# Patient Record
Sex: Female | Born: 1950
Health system: Southern US, Community
[De-identification: ages and names within clinical notes are randomized; demographics above are authoritative.]

## PROBLEM LIST (undated history)

## (undated) DIAGNOSIS — C50919 Malignant neoplasm of unspecified site of unspecified female breast: Secondary | ICD-10-CM

## (undated) DIAGNOSIS — K259 Gastric ulcer, unspecified as acute or chronic, without hemorrhage or perforation: Secondary | ICD-10-CM

## (undated) DIAGNOSIS — I10 Essential (primary) hypertension: Secondary | ICD-10-CM

## (undated) DIAGNOSIS — E785 Hyperlipidemia, unspecified: Secondary | ICD-10-CM

## (undated) DIAGNOSIS — M1990 Unspecified osteoarthritis, unspecified site: Secondary | ICD-10-CM

## (undated) HISTORY — PX: BREAST SURGERY: SHX581

## (undated) HISTORY — DX: Malignant neoplasm of unspecified site of unspecified female breast: C50.919

## (undated) HISTORY — DX: Hyperlipidemia, unspecified: E78.5

## (undated) HISTORY — DX: Essential (primary) hypertension: I10

## (undated) HISTORY — PX: MASTECTOMY: SHX3

## (undated) HISTORY — DX: Gastric ulcer, unspecified as acute or chronic, without hemorrhage or perforation: K25.9

---

## 2000-02-10 ENCOUNTER — Ambulatory Visit (HOSPITAL_COMMUNITY): Admission: RE | Admit: 2000-02-10 | Discharge: 2000-02-10 | Payer: Self-pay | Admitting: *Deleted

## 2000-02-10 ENCOUNTER — Encounter: Payer: Self-pay | Admitting: Rheumatology

## 2004-06-19 ENCOUNTER — Emergency Department (HOSPITAL_COMMUNITY): Admission: EM | Admit: 2004-06-19 | Discharge: 2004-06-19 | Payer: Self-pay | Admitting: Emergency Medicine

## 2004-06-20 ENCOUNTER — Ambulatory Visit (HOSPITAL_COMMUNITY): Admission: RE | Admit: 2004-06-20 | Discharge: 2004-06-20 | Payer: Self-pay | Admitting: Surgery

## 2004-06-25 ENCOUNTER — Ambulatory Visit (HOSPITAL_COMMUNITY): Admission: RE | Admit: 2004-06-25 | Discharge: 2004-06-25 | Payer: Self-pay | Admitting: General Surgery

## 2004-08-03 DIAGNOSIS — I10 Essential (primary) hypertension: Secondary | ICD-10-CM

## 2004-08-03 HISTORY — DX: Essential (primary) hypertension: I10

## 2006-10-30 ENCOUNTER — Emergency Department (HOSPITAL_COMMUNITY): Admission: EM | Admit: 2006-10-30 | Discharge: 2006-10-30 | Payer: Self-pay | Admitting: Emergency Medicine

## 2007-01-01 ENCOUNTER — Emergency Department (HOSPITAL_COMMUNITY): Admission: EM | Admit: 2007-01-01 | Discharge: 2007-01-02 | Payer: Self-pay | Admitting: Family Medicine

## 2008-11-27 ENCOUNTER — Encounter: Payer: Self-pay | Admitting: Gynecology

## 2008-11-27 ENCOUNTER — Ambulatory Visit: Payer: Self-pay | Admitting: Gynecology

## 2008-11-27 ENCOUNTER — Other Ambulatory Visit: Admission: RE | Admit: 2008-11-27 | Discharge: 2008-11-27 | Payer: Self-pay | Admitting: Gynecology

## 2008-12-10 ENCOUNTER — Ambulatory Visit: Payer: Self-pay | Admitting: Gynecology

## 2009-01-01 DIAGNOSIS — C50919 Malignant neoplasm of unspecified site of unspecified female breast: Secondary | ICD-10-CM

## 2009-01-01 HISTORY — DX: Malignant neoplasm of unspecified site of unspecified female breast: C50.919

## 2009-01-23 ENCOUNTER — Encounter: Admission: RE | Admit: 2009-01-23 | Discharge: 2009-01-23 | Payer: Self-pay | Admitting: Surgery

## 2009-02-01 ENCOUNTER — Encounter: Admission: RE | Admit: 2009-02-01 | Discharge: 2009-02-01 | Payer: Self-pay | Admitting: Surgery

## 2009-03-06 ENCOUNTER — Encounter: Admission: RE | Admit: 2009-03-06 | Discharge: 2009-03-06 | Payer: Self-pay | Admitting: Surgery

## 2009-03-06 ENCOUNTER — Encounter (INDEPENDENT_AMBULATORY_CARE_PROVIDER_SITE_OTHER): Payer: Self-pay | Admitting: Diagnostic Radiology

## 2009-03-12 ENCOUNTER — Ambulatory Visit: Payer: Self-pay | Admitting: Gynecology

## 2009-03-29 ENCOUNTER — Ambulatory Visit: Payer: Self-pay | Admitting: Oncology

## 2009-04-03 HISTORY — PX: MASTECTOMY: SHX3

## 2009-04-03 LAB — COMPREHENSIVE METABOLIC PANEL
Albumin: 4.1 g/dL (ref 3.5–5.2)
Alkaline Phosphatase: 68 U/L (ref 39–117)
BUN: 12 mg/dL (ref 6–23)
CO2: 26 mEq/L (ref 19–32)
Calcium: 9.7 mg/dL (ref 8.4–10.5)
Glucose, Bld: 97 mg/dL (ref 70–99)
Potassium: 4.3 mEq/L (ref 3.5–5.3)

## 2009-04-03 LAB — CBC WITH DIFFERENTIAL/PLATELET
Basophils Absolute: 0.1 10*3/uL (ref 0.0–0.1)
Eosinophils Absolute: 0.2 10*3/uL (ref 0.0–0.5)
HGB: 14.5 g/dL (ref 11.6–15.9)
MCV: 84.6 fL (ref 79.5–101.0)
MONO%: 5.6 % (ref 0.0–14.0)
NEUT#: 4.4 10*3/uL (ref 1.5–6.5)
Platelets: 360 10*3/uL (ref 145–400)
RDW: 13.3 % (ref 11.2–14.5)

## 2009-04-09 ENCOUNTER — Encounter: Admission: RE | Admit: 2009-04-09 | Discharge: 2009-04-09 | Payer: Self-pay | Admitting: Oncology

## 2009-05-01 ENCOUNTER — Encounter: Payer: Self-pay | Admitting: Internal Medicine

## 2009-05-01 ENCOUNTER — Encounter: Admission: RE | Admit: 2009-05-01 | Discharge: 2009-05-01 | Payer: Self-pay | Admitting: Surgery

## 2009-05-02 ENCOUNTER — Ambulatory Visit (HOSPITAL_BASED_OUTPATIENT_CLINIC_OR_DEPARTMENT_OTHER): Admission: RE | Admit: 2009-05-02 | Discharge: 2009-05-02 | Payer: Self-pay | Admitting: Surgery

## 2009-05-11 ENCOUNTER — Emergency Department (HOSPITAL_COMMUNITY): Admission: EM | Admit: 2009-05-11 | Discharge: 2009-05-11 | Payer: Self-pay | Admitting: Emergency Medicine

## 2009-05-21 ENCOUNTER — Ambulatory Visit: Payer: Self-pay | Admitting: Oncology

## 2009-05-23 LAB — LIPID PANEL: Cholesterol: 197 mg/dL (ref 0–200)

## 2009-08-04 ENCOUNTER — Emergency Department (HOSPITAL_COMMUNITY): Admission: EM | Admit: 2009-08-04 | Discharge: 2009-08-05 | Payer: Self-pay | Admitting: Emergency Medicine

## 2009-08-04 ENCOUNTER — Encounter: Payer: Self-pay | Admitting: Internal Medicine

## 2009-08-12 ENCOUNTER — Ambulatory Visit: Payer: Self-pay | Admitting: Internal Medicine

## 2009-08-12 DIAGNOSIS — I10 Essential (primary) hypertension: Secondary | ICD-10-CM | POA: Insufficient documentation

## 2009-08-12 DIAGNOSIS — Z853 Personal history of malignant neoplasm of breast: Secondary | ICD-10-CM | POA: Insufficient documentation

## 2009-08-15 ENCOUNTER — Telehealth (INDEPENDENT_AMBULATORY_CARE_PROVIDER_SITE_OTHER): Payer: Self-pay | Admitting: *Deleted

## 2009-08-23 ENCOUNTER — Encounter (INDEPENDENT_AMBULATORY_CARE_PROVIDER_SITE_OTHER): Payer: Self-pay | Admitting: *Deleted

## 2009-08-23 ENCOUNTER — Ambulatory Visit: Payer: Self-pay | Admitting: Internal Medicine

## 2009-08-27 ENCOUNTER — Telehealth (INDEPENDENT_AMBULATORY_CARE_PROVIDER_SITE_OTHER): Payer: Self-pay | Admitting: *Deleted

## 2009-09-04 ENCOUNTER — Telehealth (INDEPENDENT_AMBULATORY_CARE_PROVIDER_SITE_OTHER): Payer: Self-pay

## 2009-09-05 ENCOUNTER — Telehealth: Payer: Self-pay | Admitting: Internal Medicine

## 2009-09-06 ENCOUNTER — Ambulatory Visit: Payer: Self-pay | Admitting: Internal Medicine

## 2009-09-17 ENCOUNTER — Ambulatory Visit: Payer: Self-pay

## 2009-09-17 ENCOUNTER — Ambulatory Visit: Payer: Self-pay | Admitting: Cardiology

## 2009-09-17 ENCOUNTER — Encounter (HOSPITAL_COMMUNITY): Admission: RE | Admit: 2009-09-17 | Discharge: 2009-11-06 | Payer: Self-pay | Admitting: Internal Medicine

## 2009-09-17 ENCOUNTER — Ambulatory Visit: Payer: Self-pay | Admitting: Oncology

## 2009-09-19 ENCOUNTER — Encounter: Payer: Self-pay | Admitting: Internal Medicine

## 2009-09-19 LAB — CBC WITH DIFFERENTIAL/PLATELET
BASO%: 0.8 % (ref 0.0–2.0)
Basophils Absolute: 0.1 10*3/uL (ref 0.0–0.1)
EOS%: 2.8 % (ref 0.0–7.0)
Eosinophils Absolute: 0.2 10*3/uL (ref 0.0–0.5)
HCT: 43.2 % (ref 34.8–46.6)
HGB: 14.6 g/dL (ref 11.6–15.9)
LYMPH%: 41 % (ref 14.0–49.7)
MCH: 27.9 pg (ref 25.1–34.0)
MCHC: 33.8 g/dL (ref 31.5–36.0)
MCV: 82.5 fL (ref 79.5–101.0)
MONO#: 0.5 10*3/uL (ref 0.1–0.9)
MONO%: 7.7 % (ref 0.0–14.0)
NEUT#: 3.4 10*3/uL (ref 1.5–6.5)
NEUT%: 47.7 % (ref 38.4–76.8)
Platelets: 363 10*3/uL (ref 145–400)
RBC: 5.24 10*6/uL (ref 3.70–5.45)
RDW: 14.7 % — ABNORMAL HIGH (ref 11.2–14.5)
WBC: 7.1 10*3/uL (ref 3.9–10.3)
lymph#: 2.9 10*3/uL (ref 0.9–3.3)

## 2009-09-19 LAB — CONVERTED CEMR LAB
ALT: 24 units/L
AST: 17 units/L
Albumin: 3.8 g/dL
BUN: 11 mg/dL
CO2, serum: 32 mmol/L
Calcium: 9.7 mg/dL
Chloride, Serum: 102 mmol/L
Creatinine, Ser: 0.47 mg/dL
Glucose, Bld: 118 mg/dL
HCT: 43.2 %
Hemoglobin: 14.6 g/dL
Potassium, serum: 3.9 mmol/L
RBC count: 5.24 10*6/uL
Sodium, serum: 139 mmol/L
Total Bilirubin: 0.7 mg/dL
Total Protein: 7.5 g/dL
WBC, blood: 7.1 10*3/uL
platelet count: 363 10*3/uL

## 2009-09-19 LAB — COMPREHENSIVE METABOLIC PANEL
ALT: 24 U/L (ref 0–35)
AST: 17 U/L (ref 0–37)
Albumin: 3.8 g/dL (ref 3.5–5.2)
Alkaline Phosphatase: 76 U/L (ref 39–117)
BUN: 11 mg/dL (ref 6–23)
CO2: 32 mEq/L (ref 19–32)
Calcium: 9.7 mg/dL (ref 8.4–10.5)
Chloride: 102 mEq/L (ref 96–112)
Creatinine, Ser: 0.47 mg/dL (ref 0.40–1.20)
Glucose, Bld: 118 mg/dL — ABNORMAL HIGH (ref 70–99)
Potassium: 3.9 mEq/L (ref 3.5–5.3)
Sodium: 139 mEq/L (ref 135–145)
Total Bilirubin: 0.7 mg/dL (ref 0.3–1.2)
Total Protein: 7.5 g/dL (ref 6.0–8.3)

## 2009-09-19 LAB — VITAMIN D 25 HYDROXY (VIT D DEFICIENCY, FRACTURES): Vit D, 25-Hydroxy: 16 ng/mL — ABNORMAL LOW (ref 30–89)

## 2009-09-20 ENCOUNTER — Ambulatory Visit: Payer: Self-pay | Admitting: Internal Medicine

## 2009-10-01 ENCOUNTER — Ambulatory Visit: Payer: Self-pay | Admitting: Internal Medicine

## 2009-10-01 ENCOUNTER — Encounter (INDEPENDENT_AMBULATORY_CARE_PROVIDER_SITE_OTHER): Payer: Self-pay | Admitting: *Deleted

## 2009-10-01 LAB — CONVERTED CEMR LAB: Anti Nuclear Antibody(ANA): NEGATIVE

## 2009-10-03 LAB — CONVERTED CEMR LAB
Basophils Absolute: 0.1 10*3/uL (ref 0.0–0.1)
Basophils Relative: 0.9 % (ref 0.0–3.0)
Eosinophils Absolute: 0.2 10*3/uL (ref 0.0–0.7)
Eosinophils Relative: 2.7 % (ref 0.0–5.0)
HCT: 42.2 % (ref 36.0–46.0)
Hemoglobin: 14 g/dL (ref 12.0–15.0)
Lymphocytes Relative: 44.4 % (ref 12.0–46.0)
Lymphs Abs: 2.7 10*3/uL (ref 0.7–4.0)
MCHC: 33.1 g/dL (ref 30.0–36.0)
MCV: 84.7 fL (ref 78.0–100.0)
Monocytes Absolute: 0.5 10*3/uL (ref 0.1–1.0)
Monocytes Relative: 8 % (ref 3.0–12.0)
Neutro Abs: 2.8 10*3/uL (ref 1.4–7.7)
Neutrophils Relative %: 44 % (ref 43.0–77.0)
Platelets: 327 10*3/uL (ref 150.0–400.0)
RBC: 4.98 M/uL (ref 3.87–5.11)
RDW: 14.1 % (ref 11.5–14.6)
Rheumatoid fact SerPl-aCnc: <20 intl units/mL (ref 0.0–20.0)
Sed Rate: 24 mm/hr — ABNORMAL HIGH (ref 0–22)
WBC: 6.3 10*3/uL (ref 4.5–10.5)

## 2009-10-11 ENCOUNTER — Ambulatory Visit: Payer: Self-pay | Admitting: Internal Medicine

## 2009-10-21 ENCOUNTER — Telehealth (INDEPENDENT_AMBULATORY_CARE_PROVIDER_SITE_OTHER): Payer: Self-pay | Admitting: *Deleted

## 2009-10-27 ENCOUNTER — Telehealth (INDEPENDENT_AMBULATORY_CARE_PROVIDER_SITE_OTHER): Payer: Self-pay | Admitting: *Deleted

## 2009-10-28 ENCOUNTER — Encounter (INDEPENDENT_AMBULATORY_CARE_PROVIDER_SITE_OTHER): Payer: Self-pay | Admitting: *Deleted

## 2009-11-28 ENCOUNTER — Encounter: Payer: Self-pay | Admitting: Internal Medicine

## 2009-11-28 ENCOUNTER — Ambulatory Visit: Payer: Self-pay | Admitting: Gynecology

## 2009-11-28 ENCOUNTER — Other Ambulatory Visit: Admission: RE | Admit: 2009-11-28 | Discharge: 2009-11-28 | Payer: Self-pay | Admitting: Gynecology

## 2009-12-06 ENCOUNTER — Ambulatory Visit: Payer: Self-pay | Admitting: Oncology

## 2009-12-06 ENCOUNTER — Telehealth (INDEPENDENT_AMBULATORY_CARE_PROVIDER_SITE_OTHER): Payer: Self-pay | Admitting: *Deleted

## 2009-12-09 ENCOUNTER — Encounter: Payer: Self-pay | Admitting: Internal Medicine

## 2010-01-21 ENCOUNTER — Ambulatory Visit: Payer: Self-pay | Admitting: Internal Medicine

## 2010-01-21 ENCOUNTER — Encounter (INDEPENDENT_AMBULATORY_CARE_PROVIDER_SITE_OTHER): Payer: Self-pay | Admitting: *Deleted

## 2010-01-21 DIAGNOSIS — M1990 Unspecified osteoarthritis, unspecified site: Secondary | ICD-10-CM | POA: Insufficient documentation

## 2010-01-24 ENCOUNTER — Telehealth: Payer: Self-pay | Admitting: Internal Medicine

## 2010-01-27 ENCOUNTER — Encounter: Payer: Self-pay | Admitting: Internal Medicine

## 2010-02-07 ENCOUNTER — Ambulatory Visit: Payer: Self-pay | Admitting: Internal Medicine

## 2010-02-12 ENCOUNTER — Encounter (INDEPENDENT_AMBULATORY_CARE_PROVIDER_SITE_OTHER): Payer: Self-pay | Admitting: *Deleted

## 2010-02-12 LAB — CONVERTED CEMR LAB
ALT: 25 units/L (ref 0–35)
AST: 18 units/L (ref 0–37)
BUN: 12 mg/dL (ref 6–23)
CO2: 30 meq/L (ref 19–32)
Calcium: 9.7 mg/dL (ref 8.4–10.5)
Chloride: 104 meq/L (ref 96–112)
Cholesterol: 189 mg/dL (ref 0–200)
Creatinine, Ser: 0.4 mg/dL (ref 0.4–1.2)
GFR calc non Af Amer: 169.84 mL/min (ref >60)
Glucose, Bld: 92 mg/dL (ref 70–99)
HDL: 40 mg/dL (ref >39.00)
LDL Cholesterol: 126 mg/dL — ABNORMAL HIGH (ref 0–99)
Potassium: 4.2 meq/L (ref 3.5–5.1)
Sodium: 142 meq/L (ref 135–145)
TSH: 1.01 microintl units/mL (ref 0.35–5.50)
Total CHOL/HDL Ratio: 5
Triglycerides: 115 mg/dL (ref 0.0–149.0)
Uric Acid, Serum: 4.9 mg/dL (ref 2.4–7.0)
VLDL: 23 mg/dL (ref 0.0–40.0)

## 2010-03-18 ENCOUNTER — Ambulatory Visit: Payer: Self-pay | Admitting: Gastroenterology

## 2010-03-19 ENCOUNTER — Telehealth (INDEPENDENT_AMBULATORY_CARE_PROVIDER_SITE_OTHER): Payer: Self-pay | Admitting: *Deleted

## 2010-03-25 ENCOUNTER — Emergency Department (HOSPITAL_COMMUNITY): Admission: EM | Admit: 2010-03-25 | Discharge: 2010-03-26 | Payer: Self-pay | Admitting: Emergency Medicine

## 2010-05-08 ENCOUNTER — Telehealth (INDEPENDENT_AMBULATORY_CARE_PROVIDER_SITE_OTHER): Payer: Self-pay | Admitting: *Deleted

## 2010-05-13 ENCOUNTER — Ambulatory Visit: Payer: Self-pay | Admitting: Oncology

## 2010-05-15 ENCOUNTER — Encounter: Payer: Self-pay | Admitting: Internal Medicine

## 2010-08-11 ENCOUNTER — Ambulatory Visit: Admit: 2010-08-11 | Payer: Self-pay | Admitting: Internal Medicine

## 2010-08-12 ENCOUNTER — Other Ambulatory Visit: Payer: Self-pay | Admitting: Internal Medicine

## 2010-08-12 ENCOUNTER — Ambulatory Visit
Admission: RE | Admit: 2010-08-12 | Discharge: 2010-08-12 | Payer: Self-pay | Source: Home / Self Care | Attending: Internal Medicine | Admitting: Internal Medicine

## 2010-08-13 LAB — BASIC METABOLIC PANEL
BUN: 16 mg/dL (ref 6–23)
CO2: 29 mEq/L (ref 19–32)
Calcium: 10.2 mg/dL (ref 8.4–10.5)
Chloride: 101 mEq/L (ref 96–112)
Creatinine, Ser: 0.5 mg/dL (ref 0.4–1.2)
GFR: 131.78 mL/min (ref >60.00)
Glucose, Bld: 99 mg/dL (ref 70–99)
Potassium: 4.2 mEq/L (ref 3.5–5.1)
Sodium: 139 mEq/L (ref 135–145)

## 2010-08-25 ENCOUNTER — Encounter: Payer: Self-pay | Admitting: Surgery

## 2010-09-04 NOTE — Letter (Signed)
[facility]   Imported By: Lanelle Bal 10/11/2009 09:25:05  _____________________________________________________________________  External Attachment:    Type:   Image     Comment:   External Document

## 2010-09-04 NOTE — Assessment & Plan Note (Signed)
Summary: bp check/kdc  Nurse Visit   Vital Signs:  Patient profile:   60 year old female Height:      65 inches Weight:      200 pounds Pulse rate:   82 / minute BP sitting:   150 / 80  Vitals Entered By: Shary Decamp (September 06, 2009 4:12 PM) CC: BP CHECK Comments BP READINGS @ HOME: 150/76, 143/76, 162/80, 137/76, 128/91, 147/86, 162/81, 148/82 Shary Decamp  September 06, 2009 4:12 PM     Allergies: No Known Drug Allergies  Orders Added: 1)  No Charge Patient Arrived (NCPA0) [NCPA0]  Impression & Recommendations:  Problem # 1:  HYPERTENSION (ICD-401.9)  see instructions  Her updated medication list for this problem includes:    Metoprolol Tartrate 50 Mg Tabs (Metoprolol tartrate) .Marland Kitchen... 1 by mouth once daily    Hydrochlorothiazide 25 Mg Tabs (Hydrochlorothiazide) ..... One tablet, once a day in the morning    Lisinopril 40 Mg Tabs (Lisinopril) .Marland Kitchen... 1 by mouth once daily - needs bp check in 2 weeks  BP today: 150/80 Prior BP: 124/80 (08/23/2009)  Orders: No Charge Patient Arrived (NCPA0) (NCPA0)  Complete Medication List: 1)  Metoprolol Tartrate 50 Mg Tabs (Metoprolol tartrate) .Marland Kitchen.. 1 by mouth once daily 2)  Hydrochlorothiazide 25 Mg Tabs (Hydrochlorothiazide) .... One tablet, once a day in the morning 3)  Anastrozole 1 Mg Tabs (Anastrozole) .... Once daily 4)  Nabumetone 750 Mg Tabs (Nabumetone) .... As needed 5)  Fish Oil  6)  Aspirin 81 Mg Tbec (Aspirin) .Marland Kitchen.. 1 a day 7)  Lisinopril 40 Mg Tabs (Lisinopril) .Marland Kitchen.. 1 by mouth once daily - needs bp check in 2 weeks  Prescriptions: LISINOPRIL 40 MG TABS (LISINOPRIL) 1 by mouth once daily - needs bp check in 2 weeks  #30 x 1   Entered by:   Shary Decamp   Authorized by:   Nolon Rod. Jaimie Pippins MD   Signed by:   Shary Decamp on 09/06/2009   Method used:   Electronically to        [facility] (retail)       [address]       [place], [place]  [address]       Ph: [phone]       Fax:  [phone]   RxID:   [id number] LISINOPRIL 40 MG TABS (LISINOPRIL) 1 by mouth once daily - needs bp check in 2 weeks  #30 x 1   Entered by:   Shary Decamp   Authorized by:   Nolon Rod. Ricci Dirocco MD   Signed by:   Shary Decamp on 09/06/2009   Method used:   Electronically to        [facility] (retail)       [phone]       [place], [place]  [address]       Ph: [phone]       Fax: [phone]   RxID:   [id number]    Patient Instructions: 1)  increase lisinopril to 40mg  2)  nurse visit to check bp & bmp in 2 weeks

## 2010-09-04 NOTE — Progress Notes (Signed)
Summary: hold anastrazole, RTC 3 months   Phone Note Outgoing Call   Summary of Call: advise patient:   Dr Paulla Dolly rec to hold anastrazole , they will check on her 6 weeks ----> if she doesn't hear from them let me know  needs OV here in 3 months  Jose E. Paz MD  October 27, 2009 10:00 AM   Follow-up for Phone Call        mailbox is full unable to leave msg, letter mailed .Kandice Hams  October 28, 2009 11:12 AM  Follow-up by: Kandice Hams,  October 28, 2009 11:12 AM

## 2010-09-04 NOTE — Assessment & Plan Note (Signed)
Summary: bmp,bp check/kdc  Nurse Visit   Vital Signs:  Patient profile:   60 year old female Height:      65 inches Weight:      200 pounds Pulse rate:   80 / minute BP sitting:   124 / 80  Vitals Entered By: Shary Decamp (August 23, 2009 3:50 PM) CC: bp check Comments  - need to change losartan $$$ Shary Decamp  August 23, 2009 3:51 PM    Impression & Recommendations:  Problem # 1:  HYPERTENSION (ICD-401.9) change losartan to ACE i d/t  $$ The following medications were removed from the medication list:    Losartan Potassium 50 Mg Tabs (Losartan potassium) ..... One by mouth twice a day Her updated medication list for this problem includes:    Metoprolol Tartrate 50 Mg Tabs (Metoprolol tartrate) .Marland Kitchen... 1 by mouth once daily    Hydrochlorothiazide 25 Mg Tabs (Hydrochlorothiazide) ..... One tablet, once a day in the morning    Lisinopril 20 Mg Tabs (Lisinopril) .Marland Kitchen... 1 by mouth once daily - needs blood pressure checked in 2 weeks  Orders: Est. Patient Level I ([address]) Venipuncture ([address])  BP today: 124/80 Prior BP: 124/80 (08/12/2009)  Complete Medication List: 1)  Metoprolol Tartrate 50 Mg Tabs (Metoprolol tartrate) .Marland Kitchen.. 1 by mouth once daily 2)  Hydrochlorothiazide 25 Mg Tabs (Hydrochlorothiazide) .... One tablet, once a day in the morning 3)  Anastrozole 1 Mg Tabs (Anastrozole) .... Once daily 4)  Nabumetone 750 Mg Tabs (Nabumetone) .... As needed 5)  Fish Oil  6)  Aspirin 81 Mg Tbec (Aspirin) .Marland Kitchen.. 1 a day 7)  Lisinopril 20 Mg Tabs (Lisinopril) .Marland Kitchen.. 1 by mouth once daily - needs blood pressure checked in 2 weeks   Allergies: No Known Drug Allergies  Orders Added: 1)  Est. Patient Level I [[address]] 2)  Venipuncture [[address]] Prescriptions: LISINOPRIL 20 MG TABS (LISINOPRIL) 1 by mouth once daily - NEEDS BLOOD PRESSURE CHECKED IN 2 WEEKS  #30 x 1   Entered by:   Shary Decamp   Authorized by:   Nolon Rod. Declin Rajan MD   Signed by:   Shary Decamp on 08/23/2009   Method  used:   Electronically to        [facility] [address] [facility] (retail)       [phone]       [place], [place]  [address]       Ph: [phone]       Fax: [phone]   RxID:   [id number]    Patient Instructions: 1)  STOP Losartan potassium 2)  START Lisinopril 20mg  1 tablet daily 3)  Continue with Hydrochlorothiazide 25mg  1 tablet daily 4)  Nurse visit to check blood pressure in 2 weeks

## 2010-09-04 NOTE — Progress Notes (Signed)
  Phone Note Other Incoming   Caller: Patsy, Publishing rights manager Med Summary of Call: Patient cancelled stress test on 2/1 & 2/3.  (had to work).  She rescheduled for 2/15 Initial call taken by: Shary Decamp,  September 05, 2009 10:30 AM

## 2010-09-04 NOTE — Letter (Signed)
Summary: [facility]   Imported By: Lanelle Bal 12/06/2009 10:39:10  _____________________________________________________________________  External Attachment:    Type:   Image     Comment:   External Document

## 2010-09-04 NOTE — Progress Notes (Signed)
Summary: Resend Rx. to different pharmacy   Phone Note Refill Request Message from:  Patient on August 27, 2009 3:46 PM  Refills Requested: Medication #1:  METOPROLOL TARTRATE 50 MG TABS 1 by mouth once daily Daughter called and states that Rx. was sent to wrong [facility] and it should be resent to [address] [facility] [place]. Call daughterConcepcion Living at [phone] when this has been resent .  Initial call taken by: Michaelle Copas,  August 27, 2009 3:47 PM    Prescriptions: METOPROLOL TARTRATE 50 MG TABS (METOPROLOL TARTRATE) 1 by mouth once daily  #30 x 1   Entered by:   Shary Decamp   Authorized by:   Nolon Rod. Paz MD   Signed by:   Shary Decamp on 08/27/2009   Method used:   Electronically to        [facility] (retail)       [address]       [place], [place]  [address]       Ph: [phone]       Fax: [phone]   RxID:   [id number]

## 2010-09-04 NOTE — Assessment & Plan Note (Signed)
Summary: NORMAL Cardiology Nuclear Study  Nuclear Med Background Indications for Stress Test: Evaluation for Ischemia  Indications Comments: ED 08/04/09: CP secondary to HTN   History Comments: NO DOCUMENTED CAD  Symptoms: Chest Pressure, Chest Tightness, Diaphoresis, Dizziness, DOE, Fatigue, Nausea, Rapid HR, Vomiting  Symptoms Comments: CP>neck. Last episode of CP:3 days ago.   Nuclear Pre-Procedure Cardiac Risk Factors: Family History - CAD, History of Smoking, Hypertension, Obesity Caffeine/Decaff Intake: None NPO After: 9:00 AM Lungs: Clear.  O2 Sat 96% on RA. IV 0.9% NS with Angio Cath: 20g     IV Site: (R) wrist IV Started by: Stanton Kidney EMT-P Chest Size (in) 40     Cup Size C     Height (in): 65 Weight (lb): 197 BMI: 32.90 Tech Comments: Metoprolol was taken @ 8 am this day, per patient; resting heart rate 86.  Leotis Shames, EMT-P  Nuclear Med Study 1 or 2 day study:  1 day     Stress Test Type:  Eugenie Birks Reading MD:  Willa Rough, MD     Referring MD:  Willow Ora, MD Resting Radionuclide:  Technetium 26m Tetrofosmin     Resting Radionuclide Dose:  10.9 mCi  Stress Radionuclide:  Technetium 42m Tetrofosmin     Stress Radionuclide Dose:  33.0 mCi   Stress Protocol   Lexiscan: 0.4 mg   Stress Test Technologist:  Rea [employer] CMA-N     Nuclear Technologist:  Burna Mortimer Deal RT-N  Rest Procedure  Myocardial perfusion imaging was performed at rest 45 minutes following the intravenous administration of Myoview Technetium 40m Tetrofosmin.  Stress Procedure  The patient initially walked on the treadmill utilizing the Bruce protocol, but was unable to get her heart rate up due to "leg weakness", after 5:22.  She did c/o chest tightness, 5/10, with exercise.  She was then given IV Lexiscan 0.4 mg over 15-seconds.  Myoview injected at 30-seconds.  There were no significant changes with lexiscan.  Quantitative spect images were obtained after a 45 minute delay.  QPS Raw Data Images:   Normal; no motion artifact; normal heart/lung ratio. Stress Images:  There is normal uptake in all areas. Rest Images:  Normal homogeneous uptake in all areas of the myocardium. Subtraction (SDS):  No evidence of ischemia. Transient Ischemic Dilatation:  .89  (Normal <1.22)  Lung/Heart Ratio:  .32  (Normal <0.45)  Quantitative Gated Spect Images QGS EDV:  76 ml QGS ESV:  24 ml QGS EF:  68 % QGS cine images:  Normal motion  Findings Normal nuclear study      Overall Impression  Exercise Capacity: Lexiscan BP Response: Normal blood pressure response. Clinical Symptoms: See below ECG Impression: No significant ST segment change suggestive of ischemia. Overall Impression: Normal stress nuclear study. Overall Impression Comments: Patient took her Metoprolol today. She walked on the treadmill, but could not get adequate HR. She did have chest pain with no EKG change. Study was changed to [facility]

## 2010-09-04 NOTE — Assessment & Plan Note (Signed)
Summary: 2 mth fu/ns/kdc   Vital Signs:  Patient profile:   60 year old female Weight:      199 pounds Pulse rate:   80 / minute BP sitting:   110 / 68  (right arm)  Vitals Entered By: Doristine Devoid (October 11, 2009 3:40 PM) CC: 2 month roa    History of Present Illness: here for a follow-up regards the last of this visit, since then, her blood work came back and is fine.  Sed rate is 24  Allergies: No Known Drug Allergies  Past History:  Past Medical History: Reviewed history from 10/01/2009 and no changes required. Breast cancer, Dx June 2010--status post mastectomy, Arimidex for 5 years Hypertension-- Dx 2006  Past Surgical History: Reviewed history from 08/12/2009 and no changes required. Mastectomy - left 04-2009  Social History: Reviewed history from 10/01/2009 and no changes required. Occupation: [employer], Pharmacologist Married children x 4  from [place] Tobacco-- quit 2005,< <1 ppd ETOH-- no   Review of Systems       continue with multiple achy joints, again states that the shoulders are the most painful joints follow it by the ankles  Physical Exam  General:  alert and well-developed.   Extremities:  Shoulders: Symmetric, tender to palpation at the [facility] joint, range of motion slightly decreased   Impression & Recommendations:  Problem # 1:  ARTHRALGIA (ICD-719.40) workup negative, on further questioning, symptoms were apparently worse since she started taking anastrozole according to UTD, this medication may cause arthralgias in up to 17% of patients. Plan: Will contact oncology and discuss this issue consider referral to rheumatology  further workup? local injections?  Problem # 2:  addendum d/w Dr Darnelle Catalan: rec to hold anastrazole , they will check on her 6 weeks I'll check on her in 3 months , she should be back to baseline then will notify patient   Complete Medication List: 1)  Lisinopril 40 Mg Tabs (Lisinopril) .Marland Kitchen.. 1 by mouth  once daily - needs bp check in 2 weeks 2)  Metoprolol Tartrate 50 Mg Tabs (Metoprolol tartrate) .Marland Kitchen.. 1 by mouth once daily 3)  Hydrochlorothiazide 25 Mg Tabs (Hydrochlorothiazide) .... One tablet, once a day in the morning 4)  Anastrozole 1 Mg Tabs (Anastrozole) .... Once daily 5)  Fish Oil  6)  Aspirin 81 Mg Tbec (Aspirin) .Marland Kitchen.. 1 a day 7)  Meloxicam 7.5 Mg Tabs (Meloxicam) .... One by mouth daily as needed for pain

## 2010-09-04 NOTE — Letter (Signed)
Summary: Primary Care Consult Scheduled Letter  [facility] [place]  [address] [place], [place] [address]   Phone: [phone]  Fax: [phone]      08/23/2009 MRN: [id number]  Eve Ortman [address] [place], [place]  [address]    Dear Ms. Junie Spencer,    We have scheduled an appointment for you.  At the recommendation of Dr. Willow Ora, we have scheduled you for a Cardio Stress Test with Selena Batten on 09-03-2009 at 9:00am.  Their address is [address], [address] floor, [place] [address]. The office phone number is [phone].  If this appointment day and time is not convenient for you, please feel free to call the office of the doctor you are being referred to at the number listed above and reschedule the appointment.    It is important for you to keep your scheduled appointments. We are here to make sure you are given good patient care.   Thank you,    Renee, Patient Care Coordinator [facility] [place]    **WE ARE UNABLE TO REACH YOU BY PHONE.  YOUR PHONE NUMBER OF [phone] SAYS THE "MAILBOX IF FULL UNABLE TO LEAVE A MESSAGE".  PLEASE CONTACT [facility] FOR INSTRUCTIONS ON HOW TO PREPARE FOR YOUR ABOVE APPOINTMENT.  ALSO CONTACT OUR OFFICE TO CONFIRM THIS APPOINTMENT**

## 2010-09-04 NOTE — Progress Notes (Signed)
Summary: REFILL REQUEST--  Phone Note Refill Request Call back at [phone] Message from:  Pharmacy on January 24, 2010 10:17 AM  Refills Requested: Medication #1:  CELEBREX 200 MG CAPS one by mouth daily as needed for pain.   Dosage confirmed as above?Dosage Confirmed   Supply Requested: 1 month  Medication #2:  RELAFEN 750 MG TAB Take tablet by mouth twice daily as needed for pain and inflammation   Dosage confirmed as above?Dosage Confirmed   Supply Requested: 1 month   Last Refilled: 07/23/2009 [facility]- CELEBREX PRIOR AUTH-[id number]  RELAFEN- [facility] [address]  Next Appointment Scheduled: JULY 8TH 2011 Initial call taken by: Lavell Islam,  January 24, 2010 10:18 AM  Follow-up for Phone Call        Called ins. and spoke to [place] to initiate Prior Auth for Celebrex.  Form will be faxed for completion. Mervin Kung CMA  January 24, 2010 1:20 PM   Form received and placed on ledge for Provider completion.  Mervin Kung CMA  January 24, 2010 5:14 PM   Additional Follow-up for Phone Call Additional follow up Details #1::        done [facility] E. Nashid Pellum MD  January 27, 2010 8:15 AM  faxed back..............Marland KitchenFelecia Deloach CMA  January 27, 2010 5:39 PM     Additional Follow-up for Phone Call Additional follow up Details #2::    spoke with pharmacy celebrex too expensive for pt. pt would like to go back to RELAFEN. pls advise.............Marland KitchenFelecia Deloach CMA  January 30, 2010 10:08 AM   ok to go back to previous dose, 1 month, 3 RF Teryl Mcconaghy E. Vernelle Wisner MD  January 30, 2010 2:18 PM   New/Updated Medications: NABUMETONE 750 MG TABS (NABUMETONE) as needed for pain Prescriptions: NABUMETONE 750 MG TABS (NABUMETONE) as needed for pain  #1 month x 3   Entered by:   Jeremy Johann CMA   Authorized by:   Nolon Rod. Inocencio Roy MD   Signed by:   Jeremy Johann CMA on 01/30/2010   Method used:   F[facility]ed to ...       [facility]       [address]       [place], [place]  [address]       Ph: [phone]       Fax: [phone]   RxID:   [id number]   Appended Document: REFILL REQUEST-- Prescriptions: NABUMETONE 750 MG TABS (NABUMETONE) Take 1 tablet by mouth twice daily as needed for pain and inflammation  #1 month x 3   Entered by:   Jeremy Johann CMA   Authorized by:   Nolon Rod. Dorcus Riga MD   Signed by:   Jeremy Johann CMA on 01/30/2010   Method used:   Re-F[facility]ed to ...       [facility]       [address]       [place], [place]  [address]       Ph: [phone]       Fax: [phone]   RxID:   [id number]  called pharmacy cancel 1st rx.Felecia Deloach CMA  January 30, 2010 5:21 PM

## 2010-09-04 NOTE — Assessment & Plan Note (Signed)
Summary: new, referred by dr Darnelle Catalan for htn/swh only speak spanish   Vital Signs:  Patient profile:   60 year old female Height:      65 inches Weight:      200 pounds BMI:     33.40 Pulse rate:   80 / minute BP sitting:   124 / 80  Vitals Entered By: Shary Decamp (August 12, 2009 3:47 PM) CC: new pt to est Is Patient Diabetic? No Comments  - Dr. Darnelle Catalan - onc  - Dr Lily Peer - gyn Shary Decamp  August 12, 2009 3:53 PM    History of Present Illness: new patient  4 years history of hypertension, for the last year she has noted  her blood pressure to be in the 160/90 range 10-day ago, her BP was 204/102; she had right-upper chest pain, nausea, neck pain and dizziness she went to the emergency room : her BP was 180, she had chest pain for 3 hours but at the time of the E. R. evaluation  she was chest pain-free EKG was stable  d-dimer and cardiac enzyme  x 1 negative potassium 3.2, creatinine 0.5, hemoglobin 16.2  since the ER visit, she is asymptomatic, she reports no changes were made in her medicines.   Preventive Screening-Counseling & Management  Alcohol-Tobacco     Alcohol drinks/day: 0     Smoking Status: quit > 6 months     Year Quit: 2007  Caffeine-Diet-Exercise     Does Patient Exercise: no  Current Medications (verified): 1)  Metoprolol Tartrate 50 Mg Tabs (Metoprolol Tartrate) .Marland Kitchen.. 1 By Mouth Once Daily 2)  Maxzide 75-50 Mg Tabs (Triamterene-Hctz) .... Once Daily 3)  Anastrozole 1 Mg Tabs (Anastrozole) .... Once Daily 4)  Nabumetone 750 Mg Tabs (Nabumetone) .... As Needed 5)  Fish Oil  Allergies (verified): No Known Drug Allergies  Past History:  Past Medical History: Breast cancer, Dx June 2010 Hypertension-- Dx 2006  Past Surgical History: Mastectomy - left 04-2009  Family History: MI-- F MI age 87 CAD--  M age of onset 89 (?) Stroke--no DM-- brother, sister  Colon ca--no Breast ca-- no  Social History: Occupation: Loss adjuster, chartered, Pharmacologist Married children x 4  from [place] Tobacco-- quit 2005,< <1 ppd ETOH-- no Smoking Status:  quit > 6 months Does Patient Exercise:  no Occupation:  employed  Review of Systems General:  Denies fatigue and fever. CV:  Denies near fainting and swelling of feet; occasionally. palpitations. Resp:  Denies cough and shortness of breath.  Physical Exam  General:  alert, well-developed, and overweight-appearing.   Lungs:  normal respiratory effort, no intercostal retractions, no accessory muscle use, and normal breath sounds.   Heart:  normal rate, regular rhythm, no murmur, and no gallop.   Abdomen:  soft, non-tender, no distention, and no masses.  no bruit Extremities:  no edema Psych:  Oriented X3, memory intact for recent and remote, normally interactive, good eye contact, not anxious appearing, and not depressed appearing.     Impression & Recommendations:  Problem # 1:  HYPERTENSION (ICD-401.9) well-controlled? Discontinue Maxzide, start losartan and HCTZ see instructions Her updated medication list for this problem includes:    Metoprolol Tartrate 50 Mg Tabs (Metoprolol tartrate) .Marland Kitchen... 1 by mouth once daily    Hydrochlorothiazide 25 Mg Tabs (Hydrochlorothiazide) ..... One tablet, once a day in the morning    Losartan Potassium 50 Mg Tabs (Losartan potassium) ..... One by mouth twice a day  BP today: 124/80  Problem # 2:  CHEST PAIN (ICD-786.50)  CP in the setting of HTN emergency, neg cardiac enz after 3 hours of symptoms asx since 08-04-09 Mother has CAD , see FH plan: ASA Stress test after BP controlled ER if symptoms resurface  plan cerefuly  discussed w/ patient   Orders: Cardiology Referral (Cardiology)  Complete Medication List: 1)  Metoprolol Tartrate 50 Mg Tabs (Metoprolol tartrate) .Marland Kitchen.. 1 by mouth once daily 2)  Hydrochlorothiazide 25 Mg Tabs (Hydrochlorothiazide) .... One tablet, once a day in the morning 3)  Anastrozole 1 Mg Tabs  (Anastrozole) .... Once daily 4)  Nabumetone 750 Mg Tabs (Nabumetone) .... As needed 5)  Fish Oil  6)  Losartan Potassium 50 Mg Tabs (Losartan potassium) .... One by mouth twice a day 7)  Aspirin 81 Mg Tbec (Aspirin) .Marland Kitchen.. 1 a day  Patient Instructions: 1)  came back in  10 days  for a nurse visit : BMP and BP check 2)  Please schedule a follow-up appointment in 2 months.  Prescriptions: LOSARTAN POTASSIUM 50 MG TABS (LOSARTAN POTASSIUM) one by mouth twice a day  #60 x 1   Entered and Authorized by:   Elita Quick E. Esco Joslyn MD   Signed by:   Nolon Rod. Jimmye Wisnieski MD on 08/12/2009   Method used:   Print then Give to Patient   RxID:   [id number] HYDROCHLOROTHIAZIDE 25 MG TABS (HYDROCHLOROTHIAZIDE) one tablet, once a day in the morning  #30 x 1   Entered and Authorized by:   Elita Quick E. Dawood Spitler MD   Signed by:   Nolon Rod. Janus Vlcek MD on 08/12/2009   Method used:   Print then Give to Patient   RxID:   [id number]

## 2010-09-04 NOTE — Assessment & Plan Note (Signed)
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 difficulty getting out of car yesterday due to pain  Current Medications (verified): 1)  Metoprolol Tartrate 50 Mg Tabs (Metoprolol Tartrate) .Marland Kitchen.. 1 By Mouth Once Daily 2)  Hydrochlorothiazide 25 Mg Tabs (Hydrochlorothiazide) .... One Tablet, Once A Day in The Morning 3)  Anastrozole 1 Mg Tabs (Anastrozole) .... Once Daily 4)  Nabumetone 750 Mg Tabs (Nabumetone) .... As Needed 5)  Fish Oil 6)  Aspirin 81 Mg Tbec (Aspirin) .Marland Kitchen.. 1 A Day 7)  Lisinopril 40 Mg Tabs (Lisinopril) .Marland Kitchen.. 1 By Mouth Once Daily - Needs Bp Check in 2 Weeks  Allergies (verified): No Known Drug Allergies  Past History:  Past Medical History: Breast cancer, Dx June 2010--status post mastectomy, Arimidex for 5 years Hypertension-- Dx 2006  Social History: Occupation: [employer], Pharmacologist Married children x 4  from [place] Tobacco-- quit 2005,< <1 ppd ETOH-- no   Review of Systems       denies fevers, rash or any headache denies cough her wrists  and PIPs are slightly painful, no swelling  Physical Exam  General:  alert and well-developed.   Neck:  full ROM.   Extremities:  no lower extremity pain inspection and palpation of her wrists, hands, knees and ankles---> normal   Shoulders: Symmetric, tender to palpation at  the [facility] joint, range of motion slightly decreased   Impression & Recommendations:  Problem # 1:  ARTHRALGIA (ICD-719.40)  symmetric polyarthralgias, worse on the shoulders no synovitis on exam DDX includes overuse syndrome, OA as well as other arthritis  labs trial w/  steroids, NSAIDs and tylenol addendum, intolerant to steroids, prednisone prescription voided Orders: Venipuncture (16109) TLB-CBC Platelet - w/Differential (85025-CBCD) TLB-Sedimentation Rate (ESR) (85652-ESR) TLB-Rheumatoid Factor (RA) (60454-UJ) T-Antinuclear Antib (ANA) (81191-47829)  Complete Medication List: 1)  Lisinopril 40 Mg Tabs (Lisinopril) .Marland Kitchen.. 1 by mouth once daily - needs bp check in 2 weeks 2)  Metoprolol Tartrate 50 Mg Tabs (Metoprolol tartrate) .Marland Kitchen.. 1 by mouth once daily 3)  Hydrochlorothiazide 25 Mg Tabs (Hydrochlorothiazide) .... One tablet, once a day in the morning 4)  Anastrozole 1 Mg Tabs (Anastrozole) .... Once daily 5)  Fish Oil  6)  Aspirin 81 Mg Tbec (Aspirin) .Marland Kitchen.. 1 a day 7)  Meloxicam 7.5 Mg Tabs (Meloxicam) .... One by mouth daily as needed for pain  Patient Instructions: 1)  prednisone for a few days 2)  meloxicam daily as needed for pain.  Watch for stomach side effects 3)  if the pain continued, you may like to take 1000 mg of tylenol every 6 hours as needed for relief of pain Prescriptions: MELOXICAM 7.5 MG TABS (MELOXICAM) one by mouth daily  as needed for pain  #30 x 1   Entered and Authorized by:   Nolon Rod. Deni Lefever MD   Signed by:   Nolon Rod. Djeneba Barsch MD on 10/01/2009   Method used:   Print then Give to Patient   RxID:   [id number] PREDNISONE 10 MG TABS (PREDNISONE) 3 by mouth once daily x 3, 2x3, 1x3  #18 x 0   Entered and Authorized by:   Elita Quick E. Shatima Zalar MD   Signed by:   Nolon Rod. Nicolai Labonte MD on 10/01/2009   Method used:   Print then Give to Patient   RxID:   [id number] LISINOPRIL 40 MG TABS (LISINOPRIL) 1 by mouth once daily - needs bp check in 2 weeks  #90 x 1   Entered by:    Shary Decamp   Authorized by:   Nolon Rod. Bernon Arviso MD   Signed by:   Shary Decamp on 10/01/2009   Method used:   Electronically to        [facility] (retail)       [address]       [place], [place]  [address]       Ph: [phone]       Fax: [phone]   RxID:   [id number] HYDROCHLOROTHIAZIDE 25 MG TABS (HYDROCHLOROTHIAZIDE) one tablet, once a day in the morning  #90 x 1   Entered by:   Shary Decamp   Authorized by:   Nolon Rod. Arrian Manson MD   Signed by:   Shary Decamp on 10/01/2009   Method used:   Electronically to        [facility] (retail)       [address]       [place], [place]  [address]       Ph: [phone]       Fax: [phone]   RxID:   [id number] METOPROLOL TARTRATE 50 MG TABS (METOPROLOL TARTRATE) 1 by mouth once daily  #90 x 1   Entered by:   Shary Decamp   Authorized by:   Nolon Rod. Yekaterina Escutia MD   Signed by:   Shary Decamp on 10/01/2009   Method used:   Electronically to        [facility] (retail)       [address]       [place], [place]  [address]       Ph: [phone]       Fax: [phone]   RxID:   [id number]

## 2010-09-04 NOTE — Letter (Signed)
[facility]   Imported By: Lanelle Bal 01/16/2010 09:02:26  _____________________________________________________________________  External Attachment:    Type:   Image     Comment:   External Document

## 2010-09-04 NOTE — Progress Notes (Signed)
Summary: Cancelled colon  Spoke with Jillian Chandler and he said their insurance does not pay 100% for the procedure so he is cancelling the colonoscopy for his wife.

## 2010-09-04 NOTE — Assessment & Plan Note (Signed)
Summary: CPX//KN--Rm 13   Vital Signs:  Patient profile:   60 year old female Height:      65 inches Weight:      194.25 pounds BMI:     32.44 Temp:     97.2 degrees F oral Pulse rate:   84 / minute Pulse rhythm:   regular Resp:     16 per minute BP sitting:   130 / 80  (right arm) Cuff size:   regular  Vitals Entered By: Mervin Kung CMA Duncan Dull) (February 07, 2010 1:41 PM) CC: Room 12  Physical, fasting.   Is Patient Diabetic? No Comments Pt states she is not currently taking Celebrex.   History of Present Illness: complete physical exam Continued with arthralgias, at this point the pain is  mostly in the left ankle She was unable to afford Celebrex Nabumetone helped, likes  a prescription  Allergies (verified): No Known Drug Allergies  Past History:  Past Medical History: Reviewed history from 10/01/2009 and no changes required. Breast cancer, Dx June 2010--status post mastectomy, Arimidex for 5 years Hypertension-- Dx 2006  Past Surgical History: Reviewed history from 08/12/2009 and no changes required. Mastectomy - left 04-2009  Family History: Reviewed history from 08/12/2009 and no changes required. MI-- F MI age 22 CAD--  M age of onset 94 (?) Stroke--no DM-- brother, sister  Colon ca--no Breast ca-- no  Social History: Occupation: Financial controller, Pharmacologist Married children x 4  from [place] Tobacco-- quit 2005,< <1 ppd ETOH-- no  diet-- healthy  exercise--  not frecuently   Review of Systems General:  Denies fever; occasionally feels fatigue . CV:  Denies palpitations and swelling of feet; no CP other than post-op pain. Resp:  Denies cough and shortness of breath. GI:  Denies bloody stools, diarrhea, nausea, and vomiting. GU:  just saw gynecology.  Physical Exam  General:  alert, well-developed, and overweight-appearing.   Neck:  no masses and no thyromegaly.   Lungs:  normal respiratory effort, no intercostal retractions, no  accessory muscle use, and normal breath sounds.   Heart:  normal rate, regular rhythm, no murmur, and no gallop.   Abdomen:  soft, non-tender, no distention, no masses, no guarding, and no rigidity.   Pulses:  normal pedal pulses bilaterally Extremities:  no pretibial edema Right foot: Normal Left foot: No red warm, slightly swell at the dorsum? Left ankle passive rotation cause some discomfort Axillary Nodes:  no axillary lymph nodes on either side Psych:  Cognition and judgment appear intact. Alert and cooperative with normal attention span and concentration.    Impression & Recommendations:  Problem # 1:  ROUTINE GENERAL MEDICAL EXAM@HEALTH  CARE FACL (ICD-V70.0) Td today Discussed diet and exercise sees gyn  Cscope ordered by gynecology, patient has not pursued it ----WILL REFER TO GI  DEXA done at gyn per patient (last year)  Orders: Venipuncture (16109) TLB-Lipid Panel (80061-LIPID) TLB-BMP (Basic Metabolic Panel-BMET) (80048-METABOL) TLB-ALT (SGPT) (84460-ALT) TLB-AST (SGOT) (84450-SGOT) TLB-TSH (Thyroid Stimulating Hormone) (84443-TSH) Gastroenterology Referral (GI)  Problem # 2:  DEGENERATIVE JOINT DISEASE (ICD-715.90) did not try  Celebrex, unable to afford nabumetone helped but i prefer a trial w.  mobic at this time  rec: trial w/  mobic  Will also check a uric acid per patient request  The following medications were removed from the medication list:    Celebrex 200 Mg Caps (Celecoxib) ..... One by mouth daily as needed for pain    Nabumetone 750 Mg Tabs (Nabumetone) .Marland KitchenMarland KitchenMarland KitchenMarland Kitchen  Take 1 tablet by mouth twice daily as needed for pain and inflammation Her updated medication list for this problem includes:    Meloxicam 15 Mg Tabs (Meloxicam) ..... One by mouth daily  Orders: TLB-Uric Acid, Blood (84550-URIC)  Problem # 3:  BREAST CANCER, HX OF (ICD-V10.3) intolerant to Arimidex  Complete Medication List: 1)  Lisinopril 40 Mg Tabs (Lisinopril) .Marland Kitchen.. 1 by mouth once  daily - needs bp check in 2 weeks 2)  Metoprolol Tartrate 50 Mg Tabs (Metoprolol tartrate) .Marland Kitchen.. 1 by mouth once daily 3)  Hydrochlorothiazide 25 Mg Tabs (Hydrochlorothiazide) .... One tablet, once a day in the morning 4)  Fish Oil  5)  Calcium Citrate D  .... Take 2 tab once daily 6)  4 Life Transfer Factor  .... 2 tab once daily 7)  Meloxicam 15 Mg Tabs (Meloxicam) .... One by mouth daily  Other Orders: Tdap => 5yrs IM (60454) Admin 1st Vaccine (09811) Admin 1st Vaccine [facility]) [phone])  Patient Instructions: 1)  Please schedule a follow-up appointment in 6 months .  Prescriptions: MELOXICAM 15 MG TABS (MELOXICAM) one by mouth daily  #30 x 3   Entered and Authorized by:   Nolon Rod. Enza Shone MD   Signed by:   Nolon Rod. Danyela Posas MD on 02/07/2010   Method used:   Electronically to        [facility] [address] (retail)       [phone]       [place], [place]  [address]       Ph: [phone]       Fax: [phone]   RxID:   [id number]   Current Allergies (reviewed today): No known allergies   Tetanus/Td Vaccine    Vaccine Type: Tdap    Site: right deltoid    Mfr: GlaxoSmithKline    Dose: 0.5 ml    Route: IM    Given by: Mervin Kung CMA (AAMA)    Exp. Date: 10/26/2011    Lot #: [id number]

## 2010-09-04 NOTE — Assessment & Plan Note (Signed)
Summary: 6 MONTH FOLLOWUP/KN   Vital Signs:  Patient profile:   60 year old female Weight:      197.50 pounds Pulse rate:   82 / minute Pulse rhythm:   regular BP sitting:   128 / 88  (left arm) Cuff size:   regular  Vitals Entered By: Army Fossa CMA (August 12, 2010 4:11 PM) CC: follow up visit- fasting  Comments c/o neck itching [facility]   History of Present Illness: ROV Feeling well Ambulatory blood pressures within normal Good medication compliance with all meds  Review of systems Her DJD is well controlled with meloxicam as needed been active at work definitely  increase her DJD symptoms. Denies chest pain or shortness of breath Denies cough No lower extremity edema   Current Medications (verified): 1)  Lisinopril 40 Mg Tabs (Lisinopril) .Marland Kitchen.. 1 By Mouth Once Daily - Needs Bp Check in 2 Weeks 2)  Metoprolol Tartrate 50 Mg Tabs (Metoprolol Tartrate) .Marland Kitchen.. 1 By Mouth Once Daily 3)  Hydrochlorothiazide 25 Mg Tabs (Hydrochlorothiazide) .... One Tablet, Once A Day in The Morning 4)  Fish Oil 5)  Calcium Citrate D .... Take 2 Tab Once Daily 6)  4 Life Transfer Factor .... 2 Tab Once Daily 7)  Meloxicam 15 Mg Tabs (Meloxicam) .... One By Mouth Daily  Allergies (verified): No Known Drug Allergies  Past History:  Past Medical History: Breast cancer, Dx June 2010--status post mastectomy,  intolerant to Arimidex, declined tamoxifen  Hypertension-- Dx 2006  Social History: Reviewed history from 02/07/2010 and no changes required. 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   Lungs:  normal respiratory effort, no intercostal retractions, no accessory muscle use, and normal breath sounds.   Heart:  normal rate, regular rhythm, no murmur, and no gallop.   Extremities:  no  pretibial edema     Impression & Recommendations:  Problem # 1:  HYPERTENSION (ICD-401.9) at goal labs Her updated medication list for this problem includes:    Lisinopril 40 Mg Tabs (Lisinopril) .Marland Kitchen... 1 by mouth once daily - needs bp check in 2 weeks    Metoprolol Tartrate 50 Mg Tabs (Metoprolol tartrate) .Marland Kitchen... 1 by mouth once daily    Hydrochlorothiazide 25 Mg Tabs (Hydrochlorothiazide) ..... One tablet, once a day in the morning  Orders: Venipuncture (16109) TLB-BMP (Basic Metabolic Panel-BMET) (80048-METABOL) Specimen Handling (60454)  BP today: 128/88 Prior BP: 130/80 (02/07/2010)  Labs Reviewed: K+: 4.2 (02/07/2010) Creat: : 0.4 (02/07/2010)   Chol: 189 (02/07/2010)   HDL: 40.00 (02/07/2010)   LDL: 126 (02/07/2010)   TG: 115.0 (02/07/2010)  Her updated medication list for this problem includes:    Lisinopril 40 Mg Tabs (Lisinopril) .Marland Kitchen... 1 by mouth once daily - needs bp check in 2 weeks    Metoprolol Tartrate 50 Mg Tabs (Metoprolol tartrate) .Marland Kitchen... 1 by mouth once daily    Hydrochlorothiazide 25 Mg Tabs (Hydrochlorothiazide) ..... One tablet, once a day in the morning  Problem # 2:  DEGENERATIVE JOINT DISEASE (ICD-715.90) on meloxicam as needed. Symptoms well controlled  Her updated medication list for this problem includes:    Meloxicam 15 Mg Tabs (Meloxicam) ..... One by mouth daily  Problem # 3:  BREAST CANCER, HX OF (ICD-V10.3) per oncology, patient declined to try tamoxifen  Complete Medication List: 1)  Lisinopril 40 Mg  Tabs (Lisinopril) .Marland Kitchen.. 1 by mouth once daily - needs bp check in 2 weeks 2)  Metoprolol Tartrate 50 Mg Tabs (Metoprolol tartrate) .Marland Kitchen.. 1 by mouth once daily 3)  Hydrochlorothiazide 25 Mg Tabs (Hydrochlorothiazide) .... One tablet, once a day in the morning 4)  Meloxicam 15 Mg Tabs (Meloxicam) .... One by mouth daily 5)  Fish Oil  6)  Calcium Citrate D  .... Take 2 tab once daily 7)  4 Life Transfer Factor  .... 2 tab once daily  Patient  Instructions: 1)  Please schedule a follow-up appointment in 6 months, fasting , physical exam  Prescriptions: HYDROCHLOROTHIAZIDE 25 MG TABS (HYDROCHLOROTHIAZIDE) one tablet, once a day in the morning  #90 x 3   Entered and Authorized by:   Elita Quick E. Ruthvik Barnaby MD   Signed by:   Nolon Rod. Doyl Bitting MD on 08/12/2010   Method used:   Print then Give to Patient   RxID:   [id number] METOPROLOL TARTRATE 50 MG TABS (METOPROLOL TARTRATE) 1 by mouth once daily  #90 x 3   Entered and Authorized by:   Nolon Rod. Jeannetta Cerutti MD   Signed by:   Nolon Rod. Hensley Treat MD on 08/12/2010   Method used:   Print then Give to Patient   RxID:   [id number] LISINOPRIL 40 MG TABS (LISINOPRIL) 1 by mouth once daily - needs bp check in 2 weeks  #90 x 3   Entered and Authorized by:   Nolon Rod. Kala Ambriz MD   Signed by:   Nolon Rod. Navi Erber MD on 08/12/2010   Method used:   Print then Give to Patient   RxID:   [id number]    Orders Added: 1)  Venipuncture [64403] 2)  TLB-BMP (Basic Metabolic Panel-BMET) [80048-METABOL] 3)  Specimen Handling [99000] 4)  Est. Patient Level III [47425]

## 2010-09-04 NOTE — Assessment & Plan Note (Signed)
Summary: 2 WEEK BP CHECK WITH STACIA///SPH  Nurse Visit   Vital Signs:  Patient profile:   60 year old female Height:      65 inches Weight:      197 pounds Pulse rate:   88 / minute BP sitting:   120 / 80  Vitals Entered By: Shary Decamp (September 20, 2009 3:34 PM) CC: BP check.  BMP not drawn, had labs done @ cancer center yesterday.  Patient has rov scheduled for 10/11/09. Comments    Current Medications (verified): 1)  Metoprolol Tartrate 50 Mg Tabs (Metoprolol Tartrate) .Marland Kitchen.. 1 By Mouth Once Daily 2)  Hydrochlorothiazide 25 Mg Tabs (Hydrochlorothiazide) .... One Tablet, Once A Day in The Morning 3)  Anastrozole 1 Mg Tabs (Anastrozole) .... Once Daily 4)  Nabumetone 750 Mg Tabs (Nabumetone) .... As Needed 5)  Fish Oil 6)  Aspirin 81 Mg Tbec (Aspirin) .Marland Kitchen.. 1 A Day 7)  Lisinopril 40 Mg Tabs (Lisinopril) .Marland Kitchen.. 1 By Mouth Once Daily - Needs Bp Check in 2 Weeks  Allergies (verified): No Known Drug Allergies  Impression & Recommendations:  Problem # 1:  HYPERTENSION (ICD-401.9) BP seems to be well controlled BMP done at the cancer center, will try get  records Her updated medication list for this problem includes:    Metoprolol Tartrate 50 Mg Tabs (Metoprolol tartrate) .Marland Kitchen... 1 by mouth once daily    Hydrochlorothiazide 25 Mg Tabs (Hydrochlorothiazide) ..... One tablet, once a day in the morning    Lisinopril 40 Mg Tabs (Lisinopril) .Marland Kitchen... 1 by mouth once daily - needs bp check in 2 weeks  Orders: Venipuncture (82956)  BP today: 120/80 Prior BP: 150/80 (09/06/2009)  Labs Reviewed: K+: 3.9 (09/19/2009) Creat: : 0.47 (09/19/2009)     Complete Medication List: 1)  Metoprolol Tartrate 50 Mg Tabs (Metoprolol tartrate) .Marland Kitchen.. 1 by mouth once daily 2)  Hydrochlorothiazide 25 Mg Tabs (Hydrochlorothiazide) .... One tablet, once a day in the morning 3)  Anastrozole 1 Mg Tabs (Anastrozole) .... Once daily 4)  Nabumetone 750 Mg Tabs (Nabumetone) .... As needed 5)  Fish Oil  6)   Aspirin 81 Mg Tbec (Aspirin) .Marland Kitchen.. 1 a day 7)  Lisinopril 40 Mg Tabs (Lisinopril) .Marland Kitchen.. 1 by mouth once daily - needs bp check in 2 weeks   Orders Added: 1)  Venipuncture [21308] 2)  Est. Patient Level I [65784] Prescriptions: HYDROCHLOROTHIAZIDE 25 MG TABS (HYDROCHLOROTHIAZIDE) one tablet, once a day in the morning  #30 x 6   Entered by:   Shary Decamp   Authorized by:   Nolon Rod. Lucette Kratz MD   Signed by:   Shary Decamp on 09/20/2009   Method used:   Print then Give to Patient   RxID:   [id number]    Complete Blood Count Test Date: 09/19/2009             Value   Units      H/L    Reference  WBC:       7.1   X 10^3/uL          (3.5-10.0) RBC:       5.24  X 10^6/uL     H    (3.60-5.00) Hgb:       14.6  g/dl               (02.7-25.3) Hct:       43.2  %                  (  35.0-46.0) Platelets: 363   X 10^3/uL          (150-450)  Test performed by:  [facility]    Chemistry Labs Test Date: 09/19/2009                      Value Units        H/L   Reference  Sodium:             139   mmol/L             (137-145) Potassium:          3.9   mmol/L             (3.6-5.0) Chloride:           102   mmol/L             (101-111) CO2:                32    mmol/L        H    (22-31) BUN:                11    mg/dL              (0-45) Creatinine:         0.47  mg/dL         L    (4.0-9.8) Glucose-random:     118   mg/dL         H    (11-914) Calcium (total):    9.7   mg/dL              (7-82.9) SGOT:               17    U/L                (10-40) SGPT:               24    U/L                (10-40) T. Bili:            0.7   mg/dL              (5.6-2.1) Albumin:            3.8   g/dL               (3-5) Total Protein:      7.5   g/dL          H    (4-7)  Lab name:           [facility]

## 2010-09-04 NOTE — Progress Notes (Signed)
Summary: GI referral, unable to leave message 3/20, 3/23 - mailed  Phone Note Call from Patient Call back at Home Phone [phone]   Caller: Patient  ~ Geri [place] Reason for Call: Referral Summary of Call: Patient's daughter called and would like a referral for her mother to see a [place] GI doctor. Everytime she eats something spicy she has bad heartburn. Would like to seen before April 1st if possible because of financials. Thanks! Initial call taken by: Harold Barban,  October 21, 2009 11:56 AM  Follow-up for Phone Call        please  discusse  with patient's daughter: I suggest a trial with Prilosec 20 mg OTC one before breakfast GI referral if symptoms are not better, she has severe abdominal pain or blood in stools Follow-up by: Nolon Rod. Paz MD,  October 21, 2009 12:47 PM  Additional Follow-up for Phone Call Additional follow up Details #1::        Mailbox full -- unable to leave message Shary Decamp  October 21, 2009 2:21 PM Mailbox full -- unable to leave message -- will mail to pt Shary Decamp  October 23, 2009 3:10 PM

## 2010-09-04 NOTE — Letter (Signed)
Summary: Out of Work  [employer] [employer] [facility] [employer]  [address] [place], [place] [address]   Phone: [phone]  Fax: [phone]    January 21, 2010   Employee:  Jillian Chandler    To Whom It May Concern:   For Medical reasons, please excuse the above named employee from work for the following dates:  Start:  January 21, 2010  End:  January 21, 2010  If you need additional information, please feel free to contact our office.         Sincerely,        Willow Ora, MD

## 2010-09-04 NOTE — Miscellaneous (Signed)
Summary: Lec previsit  Clinical Lists Changes     Pt came into office today for her previsit prior to her colonoscopy on 03/25/10 with Dr Russella Dar. Due to pt not understanding English very well, she was rescheduled for another previsit on 03/19/10 at 4:30pm and will bring her husband. The pt will also have an interpretor with her on 03/25/10, the day of colonoscopy. She will call back if she has further questions.

## 2010-09-04 NOTE — Letter (Signed)
Summary: Previsit letter  [facility]  [address] [place], [place] [address]   Phone: [phone]  Fax: [phone]       02/12/2010 MRN: [id number]  Jillian Chandler [address] [place], [place]  [address]  Dear Ms. Jillian Chandler,  Welcome to the Gastroenterology Division at [facility].    You are scheduled to see a nurse for your pre-procedure visit on 03/18/2010 at 8:00AM on the 3rd floor at [facility], [address]  We ask that you try to arrive at our office 15 minutes prior to your appointment time to allow for check-in.  Your nurse visit will consist of discussing your medical and surgical history, your immediate family medical history, and your medications.    Please bring a complete list of all your medications or, if you prefer, bring the medication bottles and we will list them.  We will need to be aware of both prescribed and over the counter drugs.  We will need to know exact dosage information as well.  If you are on blood thinners (Coumadin, Plavix, Aggrenox, Ticlid, etc.) please call our office today/prior to your appointment, as we need to consult with your physician about holding your medication.   Please be prepared to read and sign documents such as consent forms, a financial agreement, and acknowledgement forms.  If necessary, and with your consent, a friend or relative is welcome to sit-in on the nurse visit with you.  Please bring your insurance card so that we may make a copy of it.  If your insurance requires a referral to see a specialist, please bring your referral form from your primary care physician.  No co-pay is required for this nurse visit.     If you cannot keep your appointment, please call [phone] to cancel or reschedule prior to your appointment date.  This allows [place] the opportunity to schedule an appointment for another patient in need of care.    Thank you for choosing [facility] for your medical  needs.  We appreciate the opportunity to care for you.  Please visit [place] at our website  to learn more about our practice.                     Sincerely.                                                                                                                   The Gastroenterology Division

## 2010-09-04 NOTE — Letter (Signed)
Summary: Generic Letter  [facility] [place]  [address] [place], [place] [address]   Phone: [phone]  Fax: [phone]        10/28/2009     [facility] [address] [place], [place]  [address]    Dear Ms. Junie Spencer,  Dr Darnelle Catalan recommends you to HOLD medication Anastrazole. They will check on you in 6 weeks, if you do not hear from them let me know.  You will need an office visit with me in 3 months.      Sincerely,   Office of Dr Drue Novel MD

## 2010-09-04 NOTE — Progress Notes (Signed)
Summary: rx  Phone Note Call from Patient Call back at Home Phone [phone]   Caller: Patient Summary of Call: pt needs a refill on her lisinopril 20mg --60,metoprolol 50--90days,hydroch-25--30--[facility] [address] Initial call taken by: Freddy Jaksch,  Dec 06, 2009 4:05 PM    Prescriptions: HYDROCHLOROTHIAZIDE 25 MG TABS (HYDROCHLOROTHIAZIDE) one tablet, once a day in the morning  #90 x 1   Entered by:   Shary Decamp   Authorized by:   Nolon Rod. Paz MD   Signed by:   Shary Decamp on 12/06/2009   Method used:   Electronically to        [facility] [address] [place] (retail)       [phone]       [place], [place]  [address]       Ph: [phone]       Fax: [phone]   RxID:   [id number] METOPROLOL TARTRATE 50 MG TABS (METOPROLOL TARTRATE) 1 by mouth once daily  #90 x 1   Entered by:   Shary Decamp   Authorized by:   Nolon Rod. Paz MD   Signed by:   Shary Decamp on 12/06/2009   Method used:   Electronically to        [facility] [address] [place] (retail)       [phone]       [place], [place]  [address]       Ph: [phone]       Fax: [phone]   RxID:   [id number] LISINOPRIL 40 MG TABS (LISINOPRIL) 1 by mouth once daily - needs bp check in 2 weeks  #90 x 1   Entered by:   Shary Decamp   Authorized by:   Nolon Rod. Paz MD   Signed by:   Shary Decamp on 12/06/2009   Method used:   Electronically to        [facility] [address] [place] (retail)       [phone]       [place], [place]  [address]       Ph: [phone]       Fax: [phone]   RxID:   [id number]

## 2010-09-04 NOTE — Letter (Signed)
Summary: declined tamoxifen, f/u 1 year--- Cancer [facility]  [facility]   Imported By: Lanelle Bal 06/19/2010 11:39:55  _____________________________________________________________________  External Attachment:    Type:   Image     Comment:   External Document

## 2010-09-04 NOTE — Medication Information (Signed)
Summary: Prior Authorization for Celebrex/[employer]  Prior Authorization for Celebrex/[employer]   Imported By: Lanelle Bal 02/06/2010 08:39:18  _____________________________________________________________________  External Attachment:    Type:   Image     Comment:   External Document

## 2010-09-04 NOTE — Progress Notes (Signed)
Summary: REFILL REQUEST  Phone Note Refill Request Message from:  Patient on May 08, 2010 3:57 PM  Refills Requested: Medication #1:  HYDROCHLOROTHIAZIDE 25 MG TABS one tablet   Dosage confirmed as above?Dosage Confirmed   Supply Requested: 3 months  Medication #2:  LISINOPRIL 40 MG TABS 1 by mouth once daily - needs bp check in 2 weeks   Dosage confirmed as above?Dosage Confirmed   Supply Requested: 3 months  Medication #3:  METOPROLOL TARTRATE 50 MG TABS 1 by mouth once daily   Dosage confirmed as above?Dosage Confirmed   Supply Requested: 3 months [facility] [address]  Next Appointment Scheduled: 08/11/10 Initial call taken by: Lavell Islam,  May 08, 2010 3:59 PM    Prescriptions: HYDROCHLOROTHIAZIDE 25 MG TABS (HYDROCHLOROTHIAZIDE) one tablet, once a day in the morning  #90 x 1   Entered by:   Army Fossa CMA   Authorized by:   Nolon Rod. Paz MD   Signed by:   Army Fossa CMA on 05/08/2010   Method used:   Electronically to        [facility] [address] [place] (retail)       [phone]       [place], [place]  [address]       Ph: [phone]       Fax: [phone]   RxID:   [id number] METOPROLOL TARTRATE 50 MG TABS (METOPROLOL TARTRATE) 1 by mouth once daily  #90 Each x 1   Entered by:   Army Fossa CMA   Authorized by:   Nolon Rod. Paz MD   Signed by:   Army Fossa CMA on 05/08/2010   Method used:   Electronically to        [facility] [address] [place] (retail)       [phone]       [place], [place]  [address]       Ph: [phone]       Fax: [phone]   RxID:   [id number] LISINOPRIL 40 MG TABS (LISINOPRIL) 1 by mouth once daily - needs bp check in 2 weeks  #90 x 1   Entered by:   Army Fossa CMA   Authorized by:   Nolon Rod. Paz MD   Signed by:   Army Fossa CMA on 05/08/2010   Method used:   Electronically to        [facility] [address] [place] (retail)    [phone]       [place], [place]  [address]       Ph: [phone]       Fax: [phone]   RxID:   [id number]

## 2010-09-04 NOTE — Progress Notes (Signed)
Summary: PHYS TO PHYS REVIEW REQUIRED FOR MYOVIEW  Phone Note Other Incoming Call back at [phone], OPTION 4   Caller: [facility] INSURANCE Summary of Call: IN REFERENCE TO CARDIOLOGY REFERRAL.  FOR MYOVIEW STRESS TEST, I HAVE BEEN UNABLE TO GET AUTHORIZATION FROM [facility] INSURANCE BY PHONE, OR AFTER FAXING CLINICALS.  A PHYSICIAN TO PHYSICIAN REVIEW IS NOW REQUIRED BY CALLING [phone], CHOOSE OPTION 4, AND REFERENCE CASE # [id number].   Initial call taken by: Magdalen Spatz [facility],  August 15, 2009 2:36 PM  Follow-up for Phone Call        I called [employer] to see if I could get test approved.  They will only speak to a MD.  They would not put an MD on the phone for me.  They advised me that I had to get Dr. Drue Novel to the phone first & he would have to wait on them to get their physician to the phone.  I advised them that he has patients & if they would get the physician to the phone I would get him out of the room to speak to the physician.  They advised me that it doesn't work that way.  Follow-up by: Shary Decamp,  August 16, 2009 3:15 PM  Additional Follow-up for Phone Call Additional follow up Details #1::        spoke to one of the physicians  he ok the procedure, code is:  CC  [phone], good until 10-06-09 please arrange [facility]. Paz MD  August 22, 2009 1:16 PM     Additional Follow-up for Phone Call Additional follow up Details #2::    PT IS SCH'D ON 09-03-2009 AT 9:00AM WITH [facility].  I WILL INFORM PATIENT. Follow-up by: Magdalen Spatz [facility],  August 23, 2009 3:10 PM

## 2010-09-04 NOTE — Progress Notes (Signed)
Summary: Nuc. Pre-Procedure  Phone Note Outgoing Call Call back at [facility] Phone [phone]   Summary of Call: Unable to leave message due to home mail box full regarding instructions for [facility] tomorrow.Rozell Searing.     Nuclear Med Background Indications for Stress Test: Evaluation for Ischemia  Indications Comments: ED 08/04/09: CP secondary to HTN    Symptoms: Chest Pressure, Chest Tightness, Dizziness, Nausea    Nuclear Pre-Procedure Cardiac Risk Factors: Family History - CAD, History of Smoking, Hypertension Height (in): 65  Appended Document: Nuc. Pre-Procedure The patient called the am of test and cancelled myoview. The patient had to work today. The myoview rescheduled to 09/17/09 at 12:30pm per patient request. The patient told would be charged 100.00 fee if unable to give 24 hr. cancellation. Dr. Leta Jungling office notified of cancellation.Toniann Fail.RN.  Appended Document: Nuc. Pre-Procedure The patient speaks hispanic, translator requested.P.Zylon Creamer,RN.

## 2010-09-04 NOTE — Assessment & Plan Note (Signed)
Summary: PAIN IN RIGHT LEG/KN   Vital Signs:  Patient profile:   60 year old female Height:      65 inches Weight:      191 pounds Temp:     97.9 degrees F oral Pulse rate:   72 / minute BP sitting:   118 / 62  (left arm)  Vitals Entered By: Jeremy Johann CMA (January 21, 2010 10:26 AM) CC: throbbing pain right hand,hip and legs Comments REVIEWED MED LIST, PATIENT AGREED DOSE AND INSTRUCTION CORRECT    History of Present Illness: long history of right hip pain, symptoms increase for the last 2 days The pain goes from the right hip to the right leg, lateral side, up  to the knee Worse with walking She also has pain at the base of the right thumb  Review of systems No rash in the leg No recent injury or fall She did have mild back pain yesterday and also ill-defined discomfort at the lateral side of the right calf, likely paresthesias The pain is completely different to the one caused by anastrazole (which is now resolved)  Allergies (verified): No Known Drug Allergies  Past History:  Past Medical History: Reviewed history from 10/01/2009 and no changes required. Breast cancer, Dx June 2010--status post mastectomy, Arimidex for 5 years Hypertension-- Dx 2006  Past Surgical History: Reviewed history from 08/12/2009 and no changes required. Mastectomy - left 04-2009  Social History: Reviewed history from 10/01/2009 and no changes required. Occupation: [employer], Pharmacologist Married children x 4  from [place] Tobacco-- quit 2005,< <1 ppd ETOH-- no   Physical Exam  General:  alert and well-developed.   Abdomen:  soft and non-tender.   Extremities:  no lower extremity edema Knees normal to inspection and palpation. Full range of motion No tender  at the trochanteric bursa area  left hip normal R hip: pain with passive rotation, full range of motion Hands: Normal to inspection, slightly tender at the base of the right hand thumb    Neurologic:  lower  extremity strength and reflexes normal Gait normal   Impression & Recommendations:  Problem # 1:  DEGENERATIVE JOINT DISEASE (ICD-715.90) Pain at the right hip and right hand likely from OA trial with Celebrex and Tylenol, will call if GI s/e  If not better we'll consider orthopedic surgical referral She does have paresthesias in the lateral aspect of the right calf. Radicular pain? We'll have to see how she does in the next few days    Problem # 2:  due for CPX, will call and schedule  Complete Medication List: 1)  Lisinopril 40 Mg Tabs (Lisinopril) .Marland Kitchen.. 1 by mouth once daily - needs bp check in 2 weeks 2)  Metoprolol Tartrate 50 Mg Tabs (Metoprolol tartrate) .Marland Kitchen.. 1 by mouth once daily 3)  Hydrochlorothiazide 25 Mg Tabs (Hydrochlorothiazide) .... One tablet, once a day in the morning 4)  Fish Oil  5)  Calcium Citrate D  .... Take 2 tab once daily 6)  4 Life Transfer Factor  .... 2 tab once daily 7)  Celebrex 200 Mg Caps (Celecoxib) .... One by mouth daily as needed for pain  Patient Instructions: 1)  Celebrex daily as needed for pain 2)  tylenol  500 mg one or 2 tablets every 6 hours as needed if the pain continued 3)   Avoid taking more than 4000 mg of tylenol  in a 24 hour period (can cause liver damage in higher doses).  Prescriptions: CELEBREX 200  MG CAPS (CELECOXIB) one by mouth daily as needed for pain  #30 x 3   Entered and Authorized by:   Nolon Rod. Fields Oros MD   Signed by:   Nolon Rod. Ion Gonnella MD on 01/21/2010   Method used:   Print then Give to Patient   RxID:   [id number]

## 2010-09-04 NOTE — Letter (Signed)
Summary: Work Dietitian at [employer]  [address] [place], [place] [address]   Phone: [phone]  Fax: [phone]    Today's Date: October 01, 2009  Name of Patient: Jillian Chandler  The above named patient had a medical visit today  Please take this into consideration when reviewing the time away from work/school.    Special Instructions:  [  ] None  [  X] To be off the remainder of today, returning to the normal work / school schedule tomorrow.  [  ] To be off until the next scheduled appointment on ______________________.  [  ] Other ________________________________________________________________ ________________________________________________________________________   Sincerely yours,   Shary Decamp

## 2010-09-12 ENCOUNTER — Telehealth (INDEPENDENT_AMBULATORY_CARE_PROVIDER_SITE_OTHER): Payer: Self-pay | Admitting: *Deleted

## 2010-09-18 NOTE — Progress Notes (Signed)
Summary: lost paper prescriptions --needs 3 refills asap  Phone Note Refill Request Message from:  Patient on September 12, 2010 10:22 AM  Refills Requested: Medication #1:  HYDROCHLOROTHIAZIDE 25 MG TABS one tablet  Medication #2:  METOPROLOL TARTRATE 50 MG TABS 1 by mouth once daily  Medication #3:  LISINOPRIL 40 MG TABS 1 by mouth once daily - needs bp check in 2 weeks [facility], [place],    ----patient called and said she lost paper prescriptions given to her at 1/10 office visit---needs these 3 prescriptions called in today because she is out  Next Appointment Scheduled: none Initial call taken by: Jerolyn Shin,  September 12, 2010 10:24 AM    Prescriptions: HYDROCHLOROTHIAZIDE 25 MG TABS (HYDROCHLOROTHIAZIDE) one tablet, once a day in the morning  #90 x 3   Entered by:   Army Fossa CMA   Authorized by:   Nolon Rod. Paz MD   Signed by:   Army Fossa CMA on 09/12/2010   Method used:   Electronically to        [facility] [address] (retail)       [address]       [place], [place]  [address]       Ph: [phone]       Fax: [phone]   RxID:   [id number] METOPROLOL TARTRATE 50 MG TABS (METOPROLOL TARTRATE) 1 by mouth once daily  #90 x 3   Entered by:   Army Fossa CMA   Authorized by:   Nolon Rod. Paz MD   Signed by:   Army Fossa CMA on 09/12/2010   Method used:   Electronically to        [facility] [address] (retail)       [address]       [place], [place]  [address]       Ph: [phone]       Fax: [phone]   RxID:   [id number] LISINOPRIL 40 MG TABS (LISINOPRIL) 1 by mouth once daily - needs bp check in 2 weeks  #90 x 3   Entered by:   Army Fossa CMA   Authorized by:   Nolon Rod. Paz MD   Signed by:   Army Fossa CMA on 09/12/2010   Method used:   Electronically to        [facility] (retail)       [address]       [place],  [place]  [address]       Ph: [phone]       Fax: [phone]   RxID:   [id number]

## 2010-10-10 ENCOUNTER — Other Ambulatory Visit: Payer: Self-pay | Admitting: Oncology

## 2010-10-10 ENCOUNTER — Encounter (HOSPITAL_BASED_OUTPATIENT_CLINIC_OR_DEPARTMENT_OTHER): Payer: BC Managed Care – PPO | Admitting: Oncology

## 2010-10-10 DIAGNOSIS — C50919 Malignant neoplasm of unspecified site of unspecified female breast: Secondary | ICD-10-CM

## 2010-10-10 LAB — CBC WITH DIFFERENTIAL/PLATELET
Basophils Absolute: 0.1 10*3/uL (ref 0.0–0.1)
EOS%: 1.5 % (ref 0.0–7.0)
Eosinophils Absolute: 0.1 10*3/uL (ref 0.0–0.5)
HCT: 45.3 % (ref 34.8–46.6)
HGB: 15.1 g/dL (ref 11.6–15.9)
LYMPH%: 44.2 % (ref 14.0–49.7)
MCH: 28.1 pg (ref 25.1–34.0)
MCV: 84.2 fL (ref 79.5–101.0)
MONO#: 0.4 10*3/uL (ref 0.1–0.9)
MONO%: 5.6 % (ref 0.0–14.0)
NEUT#: 3.7 10*3/uL (ref 1.5–6.5)
NEUT%: 48.1 % (ref 38.4–76.8)
Platelets: 376 10*3/uL (ref 145–400)
RDW: 13.6 % (ref 11.2–14.5)
WBC: 7.8 10*3/uL (ref 3.9–10.3)
lymph#: 3.4 10*3/uL — ABNORMAL HIGH (ref 0.9–3.3)
nRBC: 0 % (ref 0–0)

## 2010-10-10 LAB — COMPREHENSIVE METABOLIC PANEL
AST: 17 U/L (ref 0–37)
Albumin: 4.6 g/dL (ref 3.5–5.2)
BUN: 14 mg/dL (ref 6–23)
CO2: 31 mEq/L (ref 19–32)
Calcium: 10.5 mg/dL (ref 8.4–10.5)
Chloride: 99 mEq/L (ref 96–112)
Creatinine, Ser: 0.58 mg/dL (ref 0.40–1.20)
Glucose, Bld: 93 mg/dL (ref 70–99)
Potassium: 4.3 mEq/L (ref 3.5–5.3)
Sodium: 140 mEq/L (ref 135–145)
Total Bilirubin: 0.5 mg/dL (ref 0.3–1.2)

## 2010-10-17 LAB — COMPREHENSIVE METABOLIC PANEL
ALT: 27 U/L (ref 0–35)
AST: 21 U/L (ref 0–37)
Albumin: 3.7 g/dL (ref 3.5–5.2)
Alkaline Phosphatase: 70 U/L (ref 39–117)
BUN: 10 mg/dL (ref 6–23)
CO2: 32 mEq/L (ref 19–32)
Calcium: 9.7 mg/dL (ref 8.4–10.5)
Chloride: 102 mEq/L (ref 96–112)
Creatinine, Ser: 0.56 mg/dL (ref 0.4–1.2)
GFR calc Af Amer: >60 mL/min (ref >60)
GFR calc non Af Amer: >60 mL/min (ref >60)
Glucose, Bld: 127 mg/dL — ABNORMAL HIGH (ref 70–99)
Potassium: 4.1 mEq/L (ref 3.5–5.1)
Sodium: 138 mEq/L (ref 135–145)
Total Bilirubin: 0.2 mg/dL — ABNORMAL LOW (ref 0.3–1.2)
Total Protein: 6.9 g/dL (ref 6.0–8.3)

## 2010-10-17 LAB — DIFFERENTIAL
Basophils Absolute: 0 10*3/uL (ref 0.0–0.1)
Basophils Relative: 0 % (ref 0–1)
Eosinophils Absolute: 0.3 10*3/uL (ref 0.0–0.7)
Eosinophils Relative: 3 % (ref 0–5)
Lymphocytes Relative: 50 % — ABNORMAL HIGH (ref 12–46)
Lymphs Abs: 4.5 10*3/uL — ABNORMAL HIGH (ref 0.7–4.0)
Monocytes Absolute: 0.6 10*3/uL (ref 0.1–1.0)
Monocytes Relative: 7 % (ref 3–12)
Neutro Abs: 3.6 10*3/uL (ref 1.7–7.7)
Neutrophils Relative %: 40 % — ABNORMAL LOW (ref 43–77)

## 2010-10-17 LAB — CBC
HCT: 40.8 % (ref 36.0–46.0)
Hemoglobin: 13.6 g/dL (ref 12.0–15.0)
MCH: 27.8 pg (ref 26.0–34.0)
MCHC: 33.3 g/dL (ref 30.0–36.0)
MCV: 83.4 fL (ref 78.0–100.0)
Platelets: 335 10*3/uL (ref 150–400)
RBC: 4.89 MIL/uL (ref 3.87–5.11)
RDW: 13.5 % (ref 11.5–15.5)
WBC: 8.9 10*3/uL (ref 4.0–10.5)

## 2010-10-17 LAB — LIPASE, BLOOD: Lipase: 24 U/L (ref 11–59)

## 2010-10-19 LAB — POCT I-STAT, CHEM 8
Chloride: 107 mEq/L (ref 96–112)
Creatinine, Ser: 0.5 mg/dL (ref 0.4–1.2)
Hemoglobin: 13.9 g/dL (ref 12.0–15.0)
Potassium: 3.2 mEq/L — ABNORMAL LOW (ref 3.5–5.1)
Sodium: 140 mEq/L (ref 135–145)

## 2010-10-19 LAB — POCT CARDIAC MARKERS
CKMB, poc: 1.1 ng/mL (ref 1.0–8.0)
Myoglobin, poc: 33.7 ng/mL (ref 12–200)

## 2010-10-22 ENCOUNTER — Encounter (HOSPITAL_BASED_OUTPATIENT_CLINIC_OR_DEPARTMENT_OTHER): Payer: BC Managed Care – PPO | Admitting: Oncology

## 2010-10-22 DIAGNOSIS — C50919 Malignant neoplasm of unspecified site of unspecified female breast: Secondary | ICD-10-CM

## 2010-11-07 LAB — DIFFERENTIAL
Basophils Absolute: 0.1 10*3/uL (ref 0.0–0.1)
Basophils Relative: 1 % (ref 0–1)
Neutro Abs: 3.2 10*3/uL (ref 1.7–7.7)
Neutrophils Relative %: 48 % (ref 43–77)

## 2010-11-07 LAB — BASIC METABOLIC PANEL
CO2: 32 mEq/L (ref 19–32)
Calcium: 9.4 mg/dL (ref 8.4–10.5)
Creatinine, Ser: 0.4 mg/dL (ref 0.4–1.2)
GFR calc Af Amer: >60 mL/min (ref >60)
Glucose, Bld: 130 mg/dL — ABNORMAL HIGH (ref 70–99)

## 2010-11-07 LAB — CBC
MCHC: 34.3 g/dL (ref 30.0–36.0)
RBC: 5.19 MIL/uL — ABNORMAL HIGH (ref 3.87–5.11)
RDW: 13.4 % (ref 11.5–15.5)

## 2010-11-07 LAB — POCT HEMOGLOBIN-HEMACUE: Hemoglobin: 16.2 g/dL — ABNORMAL HIGH (ref 12.0–15.0)

## 2010-11-10 ENCOUNTER — Encounter: Payer: BC Managed Care – PPO | Admitting: Oncology

## 2010-11-25 ENCOUNTER — Encounter: Payer: Self-pay | Admitting: Internal Medicine

## 2010-11-25 ENCOUNTER — Ambulatory Visit (INDEPENDENT_AMBULATORY_CARE_PROVIDER_SITE_OTHER): Payer: BC Managed Care – PPO | Admitting: Internal Medicine

## 2010-11-25 ENCOUNTER — Ambulatory Visit (INDEPENDENT_AMBULATORY_CARE_PROVIDER_SITE_OTHER)
Admission: RE | Admit: 2010-11-25 | Discharge: 2010-11-25 | Disposition: A | Discharge status: Home / Self Care | Payer: BC Managed Care – PPO | Source: Ambulatory Visit | Attending: Internal Medicine | Admitting: Internal Medicine

## 2010-11-25 VITALS — BP 126/78 | HR 77 | Temp 98.2°F | Wt 186.8 lb

## 2010-11-25 DIAGNOSIS — M549 Dorsalgia, unspecified: Secondary | ICD-10-CM

## 2010-11-25 DIAGNOSIS — M545 Low back pain: Secondary | ICD-10-CM

## 2010-11-25 LAB — POCT URINALYSIS DIPSTICK
Leukocytes, UA: NEGATIVE
Protein, UA: NEGATIVE
Urobilinogen, UA: NEGATIVE

## 2010-11-25 MED ORDER — PREDNISONE 10 MG PO TABS: ORAL_TABLET | ORAL | Status: AC

## 2010-11-25 MED ORDER — PREDNISONE 10 MG PO TABS: ORAL_TABLET | ORAL | Status: DC

## 2010-11-25 MED ORDER — HYDROCODONE-ACETAMINOPHEN 5-500 MG PO TABS: 1.0000 | ORAL_TABLET | ORAL | Status: DC | PRN

## 2010-11-25 NOTE — Assessment & Plan Note (Addendum)
Acute onset of R  back pain 3 weeks ago, she occasionally has right leg  Weakness, question of a + straight leg test on the right. She has a history of breast cancer. Plan: Symptomatic treatment, see instructions. Plain x-ray of the back Refer to orthopedic surgery. Work excuse. ADDENDUM: declined steroids, can't tolerate

## 2010-11-25 NOTE — Patient Instructions (Addendum)
Get a XR No heavy lifting. Heating pad Continue with meloxicam daily. Prednisone for a few days Hydrocodone as needed if pain severe. Will cause drowsiness. Call if symptoms severe

## 2010-11-25 NOTE — Progress Notes (Signed)
  Subjective:    Patient ID: Jillian Chandler, female    DOB: 08-29-1950, 60 y.o.   MRN: [id number]  HPI 3 weeks history of sudden onset right-sided back pain, the pain is a steady but worse with certain positions and bending. No injury, she does moderately heavy lifting at work. No radiation per se but from time to time her right leg feels weaker. Aspirin seems to help for a few hours.   Past Medical History  Diagnosis Date  . Breast cancer 01/2009    status post mastectomy, intolerant to Arimidex, declined tamoxfien  . Hypertension 2006     Review of Systems No fever or chills No rash in the back or lower extremities No bladder or bowel incontinence.     Objective:   Physical Exam  Constitutional: She is oriented to person, place, and time. She appears well-developed and well-nourished.       Moderate distress due to the pain. She has some antalgic gait and posture.  Abdominal: Soft. Bowel sounds are normal. She exhibits no distension. There is no tenderness. There is no rebound and no guarding.  Musculoskeletal:       No lower extremity edema. Knees normal to inspection and palpation. Hip rotation normal bilaterally, tenderness in the right side? The patient is moderately tender to palpation at the right lower back. No deformities. No pain over the  lumbosacral spine  Neurological: She is alert and oriented to person, place, and time.       Lower extremity reflexes and strength symmetric. Straight leg test questionably positive on the right side.           Assessment & Plan:

## 2010-11-26 ENCOUNTER — Encounter (HOSPITAL_BASED_OUTPATIENT_CLINIC_OR_DEPARTMENT_OTHER): Payer: BC Managed Care – PPO | Admitting: Oncology

## 2010-11-26 DIAGNOSIS — C50919 Malignant neoplasm of unspecified site of unspecified female breast: Secondary | ICD-10-CM

## 2010-11-27 ENCOUNTER — Encounter: Payer: Self-pay | Admitting: Internal Medicine

## 2010-11-27 ENCOUNTER — Telehealth: Payer: Self-pay | Admitting: *Deleted

## 2010-11-27 NOTE — Telephone Encounter (Signed)
Message copied by Army Fossa on Thu Nov 27, 2010  3:08 PM ------      Message from: Willow Ora      Created: Thu Nov 27, 2010  2:55 PM       Please rint and mail letter regards her XR

## 2010-11-27 NOTE — Telephone Encounter (Signed)
Printed and mailed

## 2010-12-19 ENCOUNTER — Encounter: Payer: BC Managed Care – PPO | Admitting: Gynecology

## 2010-12-19 NOTE — H&P (Signed)
NAMELINDZY, RUPERT              ACCOUNT NO.:  [id number]   MEDICAL RECORD NO.:  [id number]          PATIENT TYPE:  EMS   LOCATION:  ED                           FACILITY:  [facility]   PHYSICIAN:  Currie Paris, M.D.DATE OF BIRTH:  1951/02/22   DATE OF ADMISSION:  06/19/2004  DATE OF DISCHARGE:                                HISTORY & PHYSICAL   CHIEF COMPLAINT:  Right upper quadrant abdominal pain.   HISTORY OF PRESENT ILLNESS:  Ms. Forstner came to the emergency room from  Urgent Care because of about a 36-hour history of right upper quadrant  abdominal pain.  The history is obtained somewhat from her, but she speaks  limited [place], but also from her husband who speaks very good [place].  Apparently she developed some abdominal pain last night.  No nausea, but  just right upper quadrant pain.  She had just had some [place] before that to  eat but nothing greasy or fried.  This morning, she felt better, but during  the day had increasing right upper quadrant pain and got very severe and  [facility].  She was thought to have some right upper  quadrant tenderness there, and they checked a white count which was  elevated.  They asked [place] to see her for possible acute cholecystitis.   The patient's pain is considerably better presently.  She points to the  right subcostal area as the sight of her pain, and there is no pain any  place else in her abdomen.   PAST SURGICAL HISTORY:  None.   MEDICATIONS:  Lisinopril for hypertension.   ALLERGIES:  NONE KNOWN.   SOCIAL HISTORY:  She smokes about four cigarettes per day.  She drinks no  alcohol.  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   FAMILY HISTORY:  Multiple family members have diabetes.  Other than that,  negative.   REVIEW OF SYSTEMS:  HEENT:  Negative.  CHEST:  No cough, shortness of  breath, prior history of some mild hypertension.  Otherwise negative.  ABDOMEN:  Negative, except for HPI.   GU:  Negative.  EXTREMITIES:  Negative.   PHYSICAL EXAMINATION:  GENERAL:  The patient is alert, awake, oriented.  She  is a healthy-appearing lady who is in no distress.  VITAL SIGNS:  Temperature 98, blood pressure 159/111 initially, repeat  146/86; pulse 96, respirations 20.  HEENT:  Head is normocephalic.  Eyes nonicteric.  Pupils equal, round, and  regular.  EOMs intact.  NECK:  Supple.  No masses or thyromegaly.  LUNGS:  Normal respirations.  Clear to auscultation.  HEART:  Regular rhythm.  No murmurs, rubs, or gallops are heard.  ABDOMEN:  She has distinct right upper quadrant tenderness which is fairly  significant, but there is no guarding.  The rest of the abdomen is  completely soft and benign.  It is not distended.  Bowel sounds are normal.  EXTREMITIES:  No cyanosis or edema.   LABORATORY DATA:  CBC shows an elevated white count of 16,000.  CMET is  normal.  The urinalysis is  normal.  Amylase is normal.  Lipase is normal.  Gallbladder ultrasound was reviewed and appears normal.  The pancreas is  normal.  Both kidneys look normal.   IMPRESSION:  Significant right upper quadrant pain of uncertain etiology  with an associated elevated white count of 16,000.   PLAN:  I do not have an etiology.  I am somewhat suspicious still of  biliary, although it is hard to get a history of prior biliary colic.  It is  9:30 at night, so we are going to give her some pain medication tonight,  repeat a CBC in the morning, and get an hepatobiliary scan.  At that  reevaluation, if nothing shows up and she still is symptomatic, a CT scan  may be appropriate, but I will make that decision depending on clinical  picture.     Chri   CJS/MEDQ  D:  06/19/2004  T:  06/20/2004  Job:  [id number]

## 2010-12-23 ENCOUNTER — Encounter (INDEPENDENT_AMBULATORY_CARE_PROVIDER_SITE_OTHER): Payer: BC Managed Care – PPO | Admitting: Gynecology

## 2010-12-23 ENCOUNTER — Other Ambulatory Visit (HOSPITAL_COMMUNITY)
Admission: RE | Admit: 2010-12-23 | Discharge: 2010-12-23 | Disposition: A | Discharge status: Home / Self Care | Payer: BC Managed Care – PPO | Source: Ambulatory Visit | Attending: Gynecology | Admitting: Gynecology

## 2010-12-23 ENCOUNTER — Other Ambulatory Visit: Payer: Self-pay | Admitting: Gynecology

## 2010-12-23 DIAGNOSIS — Z1211 Encounter for screening for malignant neoplasm of colon: Secondary | ICD-10-CM

## 2010-12-23 DIAGNOSIS — Z01419 Encounter for gynecological examination (general) (routine) without abnormal findings: Secondary | ICD-10-CM | POA: Insufficient documentation

## 2010-12-23 DIAGNOSIS — M545 Low back pain: Secondary | ICD-10-CM

## 2010-12-23 DIAGNOSIS — R635 Abnormal weight gain: Secondary | ICD-10-CM

## 2011-05-05 ENCOUNTER — Ambulatory Visit (INDEPENDENT_AMBULATORY_CARE_PROVIDER_SITE_OTHER): Payer: BC Managed Care – PPO | Admitting: Internal Medicine

## 2011-05-05 ENCOUNTER — Encounter: Payer: Self-pay | Admitting: *Deleted

## 2011-05-05 ENCOUNTER — Encounter: Payer: Self-pay | Admitting: Internal Medicine

## 2011-05-05 VITALS — BP 112/64 | HR 71 | Temp 98.6°F | Resp 16 | Wt 191.0 lb

## 2011-05-05 DIAGNOSIS — M549 Dorsalgia, unspecified: Secondary | ICD-10-CM

## 2011-05-05 MED ORDER — CYCLOBENZAPRINE HCL 10 MG PO TABS: 10.0000 mg | ORAL_TABLET | Freq: Two times a day (BID) | ORAL | Status: DC | PRN

## 2011-05-05 NOTE — Progress Notes (Signed)
  Subjective:    Patient ID: Jillian Chandler, female    DOB: 1951-07-04, 60 y.o.   MRN: [id number]  HPI Here due to back pain. Today, she reports that she has chronic on and off back pain for more than 10 years, this particular episode started 2 days ago, located in the mid lower back, somehow radiates to the L>R legs, lateral and posterior aspect of the thights. Quality of pain is pulling or ripping  Past Medical History  Diagnosis Date  . Breast cancer 01/2009    status post mastectomy, intolerant to Arimidex, declined tamoxfien  . Hypertension 2006   Past Surgical History  Procedure Date  . Mastectomy 04/2009     Review of Systems Denies any fevers or recent injury Back pain happens every 6 months, usually triggered by heavy lifting.     Objective:   Physical Exam  Constitutional: She is oriented to person, place, and time. She appears well-developed and well-nourished.  Abdominal: Soft. Bowel sounds are normal. She exhibits no distension. There is no tenderness. There is no rebound and no guarding.  Musculoskeletal: She exhibits no edema.  Neurological: She is alert and oriented to person, place, and time.       Antalgic posture, motor and DTRs symmetric, straight leg test seems + L>R          Assessment & Plan:

## 2011-05-05 NOTE — Assessment & Plan Note (Addendum)
Chronic low back pain for many years Chart reviewed, i found a MRI from 2001 IMPRESSION:  1. MILD DIFFUSE BULGES AT L2-3 AND L3-4.  2. L4-5 FOCAL CENTRAL ANNULAR DISC BULGE Plan: Hydrocodone, Mobic, Flexeril to be taken as needed, side effects discussed, and instructions provided in Spanish Was previously referred to a orthopedic doctor but did not go, likes a referral to Dr Ethelene Hal ----will do

## 2011-05-05 NOTE — Patient Instructions (Addendum)
Descanse, pongase una compresa caliente en la espalda Para el dolor: Hydrocodone -flexeril (dan suen~o) mobic (es como motrin, tomelo con alimentos) necesita una cita para sue examen general  Due for CPX, please schedule at your convenience

## 2011-05-07 ENCOUNTER — Other Ambulatory Visit: Payer: Self-pay | Admitting: *Deleted

## 2011-05-07 ENCOUNTER — Other Ambulatory Visit: Payer: Self-pay | Admitting: Gynecology

## 2011-05-07 ENCOUNTER — Encounter: Payer: Self-pay | Admitting: Gynecology

## 2011-05-07 DIAGNOSIS — Z9012 Acquired absence of left breast and nipple: Secondary | ICD-10-CM

## 2011-05-13 ENCOUNTER — Encounter: Payer: BC Managed Care – PPO | Admitting: Oncology

## 2011-05-13 ENCOUNTER — Other Ambulatory Visit: Payer: Self-pay | Admitting: Oncology

## 2011-05-13 ENCOUNTER — Encounter (HOSPITAL_BASED_OUTPATIENT_CLINIC_OR_DEPARTMENT_OTHER): Payer: BC Managed Care – PPO | Admitting: Oncology

## 2011-05-13 DIAGNOSIS — C50919 Malignant neoplasm of unspecified site of unspecified female breast: Secondary | ICD-10-CM

## 2011-05-13 LAB — COMPREHENSIVE METABOLIC PANEL
Albumin: 3.8 g/dL (ref 3.5–5.2)
BUN: 11 mg/dL (ref 6–23)
CO2: 30 mEq/L (ref 19–32)
Calcium: 10.4 mg/dL (ref 8.4–10.5)
Chloride: 97 mEq/L (ref 96–112)
Glucose, Bld: 130 mg/dL — ABNORMAL HIGH (ref 70–99)
Potassium: 3.5 mEq/L (ref 3.5–5.3)
Sodium: 137 mEq/L (ref 135–145)
Total Protein: 8 g/dL (ref 6.0–8.3)

## 2011-05-13 LAB — CBC WITH DIFFERENTIAL/PLATELET
Basophils Absolute: 0.1 10*3/uL (ref 0.0–0.1)
Eosinophils Absolute: 0.1 10*3/uL (ref 0.0–0.5)
HGB: 14 g/dL (ref 11.6–15.9)
MCV: 84.4 fL (ref 79.5–101.0)
MONO#: 0.6 10*3/uL (ref 0.1–0.9)
NEUT#: 6.5 10*3/uL (ref 1.5–6.5)
RDW: 13.3 % (ref 11.2–14.5)
WBC: 10.5 10*3/uL — ABNORMAL HIGH (ref 3.9–10.3)
lymph#: 3.3 10*3/uL (ref 0.9–3.3)

## 2011-05-19 ENCOUNTER — Encounter (HOSPITAL_BASED_OUTPATIENT_CLINIC_OR_DEPARTMENT_OTHER): Payer: BC Managed Care – PPO | Admitting: Oncology

## 2011-05-19 DIAGNOSIS — D0590 Unspecified type of carcinoma in situ of unspecified breast: Secondary | ICD-10-CM

## 2011-05-19 DIAGNOSIS — Z170 Estrogen receptor positive status [ER+]: Secondary | ICD-10-CM

## 2011-05-22 ENCOUNTER — Encounter: Payer: Self-pay | Admitting: Internal Medicine

## 2011-05-29 ENCOUNTER — Ambulatory Visit: Payer: BC Managed Care – PPO | Admitting: Internal Medicine

## 2011-06-18 ENCOUNTER — Ambulatory Visit (INDEPENDENT_AMBULATORY_CARE_PROVIDER_SITE_OTHER): Payer: BC Managed Care – PPO | Admitting: Internal Medicine

## 2011-06-18 ENCOUNTER — Encounter: Payer: Self-pay | Admitting: Internal Medicine

## 2011-06-18 DIAGNOSIS — Z0000 Encounter for general adult medical examination without abnormal findings: Secondary | ICD-10-CM | POA: Insufficient documentation

## 2011-06-18 DIAGNOSIS — M549 Dorsalgia, unspecified: Secondary | ICD-10-CM

## 2011-06-18 DIAGNOSIS — I10 Essential (primary) hypertension: Secondary | ICD-10-CM

## 2011-06-18 DIAGNOSIS — Z23 Encounter for immunization: Secondary | ICD-10-CM

## 2011-06-18 LAB — LIPID PANEL
Cholesterol: 178 mg/dL (ref 0–200)
LDL Cholesterol: 101 mg/dL — ABNORMAL HIGH (ref 0–99)
Total CHOL/HDL Ratio: 4
VLDL: 30 mg/dL (ref 0.0–40.0)

## 2011-06-18 LAB — HEMOGLOBIN A1C: Hgb A1c MFr Bld: 6.7 % — ABNORMAL HIGH (ref 4.6–6.5)

## 2011-06-18 NOTE — Assessment & Plan Note (Signed)
Well controlled, no change, call for RF

## 2011-06-18 NOTE — Assessment & Plan Note (Addendum)
improved

## 2011-06-18 NOTE — Assessment & Plan Note (Addendum)
Td 01-2010 Flu shot recommended ---done  Cont w/ her healthy  diet and life style  sees gyn  Cscope for 2011 was cancelled by pt---->  REFER TO GI  DEXA done at gyn per patient (2010) labs

## 2011-06-18 NOTE — Progress Notes (Signed)
  Subjective:    Patient ID: Jillian Chandler, female    DOB: [birth year], 60 y.o.   MRN: [id number]  HPI Complete physical exam  Was having back pain, saw Dr Ethelene Hal, he prescribed steroids and physical therapy and now she is doing much better.  Past Medical History  Diagnosis Date  . Breast cancer 01/2009    status post mastectomy, intolerant to Arimidex, declined tamoxfien  . Hypertension 2006   Past Surgical History  Procedure Date  . Mastectomy 04/2009   History   Social History  . Marital Status: Married    Spouse Name: N/A    Number of Children: 4  . Years of Education: N/A   Occupational History  . [employer] Psychologist, prison and probation services   Social History Main Topics  . Smoking status: Former Games developer  . Smokeless tobacco: Never Used  . Alcohol Use: No  . Drug Use: No  . Sexually Active: Not on file   Other Topics Concern  . Not on file   Social History Narrative   Original from [place] ---Diet: very  healthyExercise- not frequently but active at work   Family History  Problem Relation Age of Onset  . Heart attack Father 56    F age 85, M age 32s  . Stroke Neg Hx   . Diabetes      3 siblings   . Colon cancer Neg Hx   . Breast cancer Neg Hx     Review of Systems  Respiratory: Negative for cough and shortness of breath.   Cardiovascular: Negative for chest pain and leg swelling.  Gastrointestinal: Negative for abdominal pain and blood in stool.  Genitourinary: Negative for dysuria and hematuria.  Psychiatric/Behavioral:       No depression or anxiety        Objective:   Physical Exam  Constitutional: She is oriented to person, place, and time. She appears well-developed and well-nourished.  HENT:  Head: Normocephalic and atraumatic.  Neck: No thyromegaly present.  Cardiovascular: Normal rate, regular rhythm and normal heart sounds.   No murmur heard. Pulmonary/Chest: Effort normal and breath sounds normal. No respiratory distress. She has no wheezes.  She has no rales.  Abdominal: Soft. She exhibits no distension. There is no tenderness. There is no rebound and no guarding.  Musculoskeletal: She exhibits no edema.  Neurological: She is alert and oriented to person, place, and time.  Skin: Skin is warm and dry. She is not diaphoretic.  Psychiatric: She has a normal mood and affect. Her behavior is normal. Judgment and thought content normal.       Assessment & Plan:

## 2011-06-19 ENCOUNTER — Encounter: Payer: Self-pay | Admitting: Gastroenterology

## 2011-06-19 LAB — TSH: TSH: 0.99 u[IU]/mL (ref 0.35–5.50)

## 2011-06-22 ENCOUNTER — Telehealth: Payer: Self-pay | Admitting: Internal Medicine

## 2011-06-22 NOTE — Telephone Encounter (Signed)
Labs reviewed, A1c 6.7, triglycerides 151.  Dx--new onset of diabetes. I will  recommend diet, exercise, referral to a nutritionist. Left message for the patient to call back

## 2011-06-22 NOTE — Telephone Encounter (Signed)
Please call pt back at [phone]

## 2011-06-23 NOTE — Telephone Encounter (Signed)
Spoke with the patient today, will arrange the  referral, she is to return to the office in 3 months

## 2011-07-08 ENCOUNTER — Telehealth: Payer: Self-pay | Admitting: *Deleted

## 2011-07-08 NOTE — Telephone Encounter (Signed)
requested that the spanish interpreter to call the patient and inform the patient of the new date and time

## 2011-07-15 ENCOUNTER — Ambulatory Visit (INDEPENDENT_AMBULATORY_CARE_PROVIDER_SITE_OTHER): Payer: Self-pay | Admitting: Surgery

## 2011-07-16 ENCOUNTER — Emergency Department (HOSPITAL_COMMUNITY)
Admission: EM | Admit: 2011-07-16 | Discharge: 2011-07-17 | Disposition: A | Discharge status: Home / Self Care | Payer: BC Managed Care – PPO | Attending: Emergency Medicine | Admitting: Emergency Medicine

## 2011-07-16 ENCOUNTER — Encounter (HOSPITAL_COMMUNITY): Payer: Self-pay | Admitting: Emergency Medicine

## 2011-07-16 DIAGNOSIS — I10 Essential (primary) hypertension: Secondary | ICD-10-CM | POA: Insufficient documentation

## 2011-07-16 DIAGNOSIS — Z79899 Other long term (current) drug therapy: Secondary | ICD-10-CM | POA: Insufficient documentation

## 2011-07-16 DIAGNOSIS — K5732 Diverticulitis of large intestine without perforation or abscess without bleeding: Secondary | ICD-10-CM | POA: Insufficient documentation

## 2011-07-16 DIAGNOSIS — R509 Fever, unspecified: Secondary | ICD-10-CM | POA: Insufficient documentation

## 2011-07-16 DIAGNOSIS — R10819 Abdominal tenderness, unspecified site: Secondary | ICD-10-CM | POA: Insufficient documentation

## 2011-07-16 DIAGNOSIS — R109 Unspecified abdominal pain: Secondary | ICD-10-CM | POA: Insufficient documentation

## 2011-07-16 DIAGNOSIS — Z853 Personal history of malignant neoplasm of breast: Secondary | ICD-10-CM | POA: Insufficient documentation

## 2011-07-16 DIAGNOSIS — R630 Anorexia: Secondary | ICD-10-CM | POA: Insufficient documentation

## 2011-07-16 DIAGNOSIS — R110 Nausea: Secondary | ICD-10-CM | POA: Insufficient documentation

## 2011-07-16 DIAGNOSIS — K5792 Diverticulitis of intestine, part unspecified, without perforation or abscess without bleeding: Secondary | ICD-10-CM

## 2011-07-16 LAB — CBC
HCT: 42 % (ref 36.0–46.0)
Hemoglobin: 14.4 g/dL (ref 12.0–15.0)
MCH: 29 pg (ref 26.0–34.0)
MCHC: 34.3 g/dL (ref 30.0–36.0)
MCV: 84.5 fL (ref 78.0–100.0)
Platelets: 358 10*3/uL (ref 150–400)
RBC: 4.97 MIL/uL (ref 3.87–5.11)
RDW: 13.7 % (ref 11.5–15.5)
WBC: 14.8 K/uL — ABNORMAL HIGH (ref 4.0–10.5)

## 2011-07-16 LAB — DIFFERENTIAL
Basophils Absolute: 0.1 K/uL (ref 0.0–0.1)
Basophils Relative: 0 % (ref 0–1)
Eosinophils Absolute: 0.1 K/uL (ref 0.0–0.7)
Eosinophils Relative: 1 % (ref 0–5)
Lymphocytes Relative: 26 % (ref 12–46)
Lymphs Abs: 3.8 K/uL (ref 0.7–4.0)
Monocytes Absolute: 1.2 K/uL — ABNORMAL HIGH (ref 0.1–1.0)
Monocytes Relative: 8 % (ref 3–12)
Neutro Abs: 9.5 K/uL — ABNORMAL HIGH (ref 1.7–7.7)
Neutrophils Relative %: 65 % (ref 43–77)

## 2011-07-16 NOTE — ED Notes (Signed)
PT. REPORTS PROGRESSING MID/LOW ABDOMINAL PAIN WITH NAUSEA , NO VOMITTING OR DIARREA, LOW GRADE FEVER / SLIGHT CHILLS.

## 2011-07-17 ENCOUNTER — Encounter: Payer: Self-pay | Admitting: Gastroenterology

## 2011-07-17 ENCOUNTER — Encounter: Payer: Self-pay | Admitting: Internal Medicine

## 2011-07-17 ENCOUNTER — Encounter (HOSPITAL_COMMUNITY): Payer: Self-pay | Admitting: Radiology

## 2011-07-17 ENCOUNTER — Emergency Department (HOSPITAL_COMMUNITY): Payer: BC Managed Care – PPO

## 2011-07-17 LAB — URINALYSIS, ROUTINE W REFLEX MICROSCOPIC
Bilirubin Urine: NEGATIVE
Glucose, UA: NEGATIVE mg/dL
Hgb urine dipstick: NEGATIVE
Ketones, ur: NEGATIVE mg/dL
Leukocytes, UA: NEGATIVE
Nitrite: NEGATIVE
Protein, ur: NEGATIVE mg/dL
Specific Gravity, Urine: 1.008 (ref 1.005–1.030)
Urobilinogen, UA: 0.2 mg/dL (ref 0.0–1.0)
pH: 7 (ref 5.0–8.0)

## 2011-07-17 LAB — HEPATIC FUNCTION PANEL
ALT: 17 U/L (ref 0–35)
AST: 15 U/L (ref 0–37)
Albumin: 3.8 g/dL (ref 3.5–5.2)
Alkaline Phosphatase: 73 U/L (ref 39–117)
Bilirubin, Direct: 0.1 mg/dL (ref 0.0–0.3)
Indirect Bilirubin: 0.5 mg/dL (ref 0.3–0.9)
Total Bilirubin: 0.6 mg/dL (ref 0.3–1.2)
Total Protein: 7.8 g/dL (ref 6.0–8.3)

## 2011-07-17 LAB — LIPASE, BLOOD: Lipase: 19 U/L (ref 11–59)

## 2011-07-17 LAB — BASIC METABOLIC PANEL
Calcium: 10.2 mg/dL (ref 8.4–10.5)
Creatinine, Ser: 0.54 mg/dL (ref 0.50–1.10)
GFR calc non Af Amer: >90 mL/min (ref >90)
Glucose, Bld: 125 mg/dL — ABNORMAL HIGH (ref 70–99)
Sodium: 137 mEq/L (ref 135–145)

## 2011-07-17 LAB — BASIC METABOLIC PANEL WITH GFR
BUN: 11 mg/dL (ref 6–23)
CO2: 29 meq/L (ref 19–32)
Chloride: 98 meq/L (ref 96–112)
GFR calc Af Amer: >90 mL/min (ref >90)
Potassium: 4 meq/L (ref 3.5–5.1)

## 2011-07-17 MED ORDER — CIPROFLOXACIN HCL 500 MG PO TABS: 500.0000 mg | ORAL_TABLET | Freq: Two times a day (BID) | ORAL | Status: AC

## 2011-07-17 MED ORDER — HYDROMORPHONE HCL PF 1 MG/ML IJ SOLN
1.0000 mg | Freq: Once | INTRAMUSCULAR | Status: AC
  Administered 2011-07-17: 1 mg via INTRAVENOUS
  Filled 2011-07-17: qty 1

## 2011-07-17 MED ORDER — ONDANSETRON HCL 4 MG/2ML IJ SOLN
4.0000 mg | Freq: Once | INTRAMUSCULAR | Status: AC
  Administered 2011-07-17: 4 mg via INTRAVENOUS

## 2011-07-17 MED ORDER — IOHEXOL 300 MG/ML  SOLN
20.0000 mL | INTRAMUSCULAR | Status: AC
  Administered 2011-07-17: 20 mL via ORAL

## 2011-07-17 MED ORDER — ONDANSETRON HCL 4 MG/2ML IJ SOLN
4.0000 mg | Freq: Once | INTRAMUSCULAR | Status: AC
  Administered 2011-07-17: 4 mg via INTRAVENOUS
  Filled 2011-07-17: qty 2

## 2011-07-17 MED ORDER — HYDROCODONE-ACETAMINOPHEN 5-325 MG PO TABS: ORAL_TABLET | ORAL | Status: DC

## 2011-07-17 MED ORDER — SODIUM CHLORIDE 0.9 % IV SOLN
Freq: Once | INTRAVENOUS | Status: AC
  Administered 2011-07-17: 01:00:00 via INTRAVENOUS

## 2011-07-17 MED ORDER — METRONIDAZOLE 500 MG PO TABS: 500.0000 mg | ORAL_TABLET | Freq: Three times a day (TID) | ORAL | Status: AC

## 2011-07-17 MED ORDER — ONDANSETRON HCL 4 MG/2ML IJ SOLN
INTRAMUSCULAR | Status: AC
  Filled 2011-07-17: qty 2

## 2011-07-17 MED ORDER — IOHEXOL 300 MG/ML  SOLN
100.0000 mL | Freq: Once | INTRAMUSCULAR | Status: AC | PRN
  Administered 2011-07-17: 100 mL via INTRAVENOUS

## 2011-07-17 MED ORDER — METRONIDAZOLE IN NACL 5-0.79 MG/ML-% IV SOLN
500.0000 mg | Freq: Once | INTRAVENOUS | Status: AC
  Administered 2011-07-17: 500 mg via INTRAVENOUS
  Filled 2011-07-17: qty 100

## 2011-07-17 MED ORDER — DOCUSATE SODIUM 100 MG PO CAPS: 100.0000 mg | ORAL_CAPSULE | Freq: Two times a day (BID) | ORAL | Status: AC

## 2011-07-17 MED ORDER — CIPROFLOXACIN IN D5W 400 MG/200ML IV SOLN
400.0000 mg | Freq: Once | INTRAVENOUS | Status: AC
  Administered 2011-07-17: 400 mg via INTRAVENOUS
  Filled 2011-07-17: qty 200

## 2011-07-17 MED ORDER — ONDANSETRON HCL 4 MG PO TABS: 8.0000 mg | ORAL_TABLET | Freq: Two times a day (BID) | ORAL | Status: AC | PRN

## 2011-07-17 NOTE — ED Notes (Signed)
Patient transported to CT 

## 2011-07-17 NOTE — ED Provider Notes (Signed)
History     CSN: [id number] Arrival date & time: 07/16/2011 11:21 PM   First MD Initiated Contact with Patient 07/17/11 0036      Chief Complaint  Patient presents with  . Abdominal Pain    (Consider location/radiation/quality/duration/timing/severity/associated sxs/prior treatment) HPI Comments: Level 5 caveat due to language barrier.  Patient was greater than one day of gradually worsening periumbilical pain as well as pain to her right upper and lower quadrants and suprapubic region. She has had poor appetite and nausea without vomiting. She denies any recent bowel movements also denies any constipation or diarrhea. There has not been any bloody bowel movements. She denies any pain with urination. She denies any previous history of abdominal surgeries. She does have a significant history of breast cancer status post left mastectomy. She does also have a history of high blood pressure. She has been taking some over-the-counter medications without any relief. Her social history includes prior smoking but no active smoking, no alcohol use.  Patient is a 60 y.o. female presenting with abdominal pain. The history is provided by the patient and a relative. The history is limited by a language barrier.  Abdominal Pain The primary symptoms of the illness include abdominal pain.    Past Medical History  Diagnosis Date  . Breast cancer 01/2009    status post mastectomy, intolerant to Arimidex, declined tamoxfien  . Hypertension 2006  . Breast cancer     Past Surgical History  Procedure Date  . Mastectomy 04/2009  . Mastectomy     Family History  Problem Relation Age of Onset  . Heart attack Father 78    F age 41, M age 67s  . Stroke Neg Hx   . Diabetes      3 siblings   . Colon cancer Neg Hx   . Breast cancer Neg Hx     History  Substance Use Topics  . Smoking status: Former Games developer  . Smokeless tobacco: Never Used  . Alcohol Use: No    OB History    Grav Para Term  Preterm Abortions TAB SAB Ect Mult Living                  Review of Systems  Unable to perform ROS: Other  Gastrointestinal: Positive for abdominal pain.    Allergies  Review of patient's allergies indicates no known allergies.  Home Medications   Current Outpatient Rx  Name Route Sig Dispense Refill  . ALUM HYDROXIDE-MAG TRISILICATE 80-20 MG PO CHEW Oral Chew 1 tablet by mouth 3 (three) times daily as needed. For heart burn      . HYDROCHLOROTHIAZIDE 25 MG PO TABS Oral Take 25 mg by mouth daily.      Marland Kitchen LISINOPRIL 40 MG PO TABS Oral Take 40 mg by mouth daily.      Marland Kitchen METOPROLOL TARTRATE 50 MG PO TABS Oral Take 50 mg by mouth daily.      Marland Kitchen SIMETHICONE 80 MG PO CHEW Oral Chew 80 mg by mouth every 6 (six) hours as needed. For gas       BP 98/62  Pulse 73  Temp(Src) 97.5 F (36.4 C) (Oral)  Resp 16  SpO2 93%  Physical Exam  Nursing note and vitals reviewed. Constitutional: She is oriented to person, place, and time. She appears well-developed and well-nourished. She appears distressed.  HENT:  Head: Normocephalic and atraumatic.  Eyes: Pupils are equal, round, and reactive to light. No scleral icterus.  Neck: Normal range  of motion. Neck supple.  Cardiovascular: Normal rate.   Pulmonary/Chest: Effort normal and breath sounds normal.  Abdominal: Soft. She exhibits no mass. Bowel sounds are increased. There is no hepatosplenomegaly. There is tenderness in the right upper quadrant, right lower quadrant, periumbilical area and suprapubic area. There is guarding and tenderness at McBurney's point. There is no rigidity, no rebound, no CVA tenderness and negative Murphy's sign. No hernia.  Musculoskeletal: Normal range of motion.  Lymphadenopathy:    She has no cervical adenopathy.  Neurological: She is alert and oriented to person, place, and time.  Skin: Skin is warm.    ED Course  Procedures (including critical care time)  Labs Reviewed  BASIC METABOLIC PANEL - Abnormal;  Notable for the following:    Glucose, Bld 125 (*)    All other components within normal limits  CBC - Abnormal; Notable for the following:    WBC 14.8 (*)    All other components within normal limits  DIFFERENTIAL - Abnormal; Notable for the following:    Neutro Abs 9.5 (*)    Monocytes Absolute 1.2 (*)    All other components within normal limits  URINALYSIS, ROUTINE W REFLEX MICROSCOPIC  LIPASE, BLOOD  POCT PREGNANCY, URINE  HEPATIC FUNCTION PANEL  POCT PREGNANCY, URINE   Ct Abdomen Pelvis W Contrast  07/17/2011  *RADIOLOGY REPORT*  Clinical Data: Abdominal pain and nausea.  Fever.  Chills.  CT ABDOMEN AND PELVIS WITH CONTRAST  Technique:  Multidetector CT imaging of the abdomen and pelvis was performed following the standard protocol during bolus administration of intravenous contrast.  Contrast: OMNIPAQUE IOHEXOL 300 MG/ML IV SOLN, 1 OMNIPAQUE IOHEXOL 300 MG/ML IV SOLN  Comparison: 06/20/2004  Findings: Probable dependent atelectasis in the lung bases.  A small hematocele in the left lung base.  Calcifications in the liver probably representing granulomas.  The liver, spleen, gallbladder, pancreas, adrenal glands, abdominal aorta, and retroperitoneal lymph nodes are otherwise unremarkable.  Mild calcification in the abdominal aorta.  There are small stones in the upper pole and mid pole of the left kidney measuring about 3 mm diameter each.  Cyst in the left kidney.  No solid mass or hydronephrosis in either kidney.  No ureteral stones are visualized.  The stomach and small bowel are unremarkable.  Stool filled colon.  There is an inflammatory infiltrative process medial to the cecum with central gas collection measuring about 12 mm diameter.  The appendix is separated from this area.  The appendix is dilated but is filled with gas and appears similar to the previous study.  Changes are most consistent with right colonic diverticulitis.  The colon is not distended.  No free fluid or free  air in the abdomen.  Moderately prominent right lower quadrant and mesenteric lymph nodes are probably reactive.  Pelvis:  The uterus and adnexal structures are not enlarged.  The bladder wall is not thickened.  No free or loculated pelvic fluid collections.  No inflammatory changes demonstrated in the sigmoid colon.  Mild degenerative changes in the lumbar spine.  IMPRESSION: Focal inflammatory process medial to the cecum with central gas collection most likely to represent right sided diverticulitis. The appendix is separately visualized and appears normal.  Original Report Authenticated By: Marlon Pel, M.D.     No diagnosis found.    MDM    Pt with some guard and tenderness on exam, mostly in RLQ, but also diffusely to suprapucbic and some to RUQ.  CT scan to r/o  appendicitis.    WBC is up, CT per radiologist which I reviewed shows right side diverticulitis.  Pt continues to need analgesics.  Will give IV abx, more pain meds.  Discussed with pt and family.  She is overally improved, no vomiting, no perforation seen on CT scan.  Pt would rather go home.  First dose of abx given.  Pt isntructed to follow up closely with PCP Dr. Drue Novel on Monday if possible.          Gavin Pound. Oletta Lamas, MD 07/17/11 [phone]

## 2011-07-17 NOTE — Discharge Instructions (Signed)
Diverticulitis (Diverticulitis)  Un divertculo es una pequea bolsa o saco en el colon. La diverticulosis es la presencia de divertculos en el colon. Es la irritacin (inflamacin) o infeccin de los divertculos.  CAUSAS Ciertos grmenes (bacterias) colonizan el colon y los divertculos. Si algunas partculas de alimento obstruyen la pequea abertura de un divertculo, las bacterias que existen en su interior se multiplican y causan un aumento de la presin. Esto produce la infeccin y la inflamacin denominada diverticulitis. SNTOMAS  Dolor y Associate Professor en el vientre (abdomen). Por lo general el dolor se ubica en la zona inferior izquierda del abdomen. Sin embargo, podra Sales promotion account executive.   Grant Ruts.   Hinchazn.   Ganas de vomitar (nuseas).   Vmitos.   Heces anormales.  DIAGNSTICO La historia clnica y el examen fsico ayudarn a diagnosticar la enfermedad. Ser necesario realizar otras pruebas ya que muchas cosas pueden causar dolor abdominal. Estas pruebas son:  Arna Snipe.   Pruebas de [place].   Radiografas de abdomen.   Tomografas computarizadas de abdomen.  En algunos casos se indicar ciruga para determinar si los sntomas se deben a diverticulitis o a otros trastornos. TRATAMIENTO En la mayor parte de los casos se trata sin [place]. El tratamiento incluye:  Reposo del intestino restringiendo la alimentacin slo a lquidos durante [employer]. Cuando mejore, deber seguir una dieta baja en fibras.   Si est deshidratado le administrarn lquidos por va endovenosa.   Le indicarn antibiticos para tratar la infeccin.   Medicamentos para Chief Technology Officer y las nuseas en caso de ser necesario.   Se indica la ciruga si el divertculo inflamado se ha perforado.  INSTRUCCIONES PARA EL CUIDADO DOMICILIARIO  Siga una dieta lquida (caldo, t o agua durante el tiempo que se lo indique su mdico). Podr gradualmente comenzar una dieta baja en fibras  segn lo que tolere. Una dieta baja en fibras es la que contiene menos de 10 gramos de [place]. Elija entre los alimentos a continuacin para reducir la cantidad de fibra de la dieta:   Pan blanco, cereales, arroz y pastas.   Nils Pyle y vegetales cocidos o frutas frescas blandas y vegetales sin piel.   Carne molida o un bife tierno bien cocido, jamn, ternera, cordero, cerdo o aves.   Huevos y frutos de mar.   Luego que los sntomas de la diverticulitis hayan mejorado, el mdico podr indicarle una dieta con ms cantidad [place]. Este tipo de dieta incluye 14 gramos de fibra cada 1000 caloras consumidas. Para una dieta estndar de 2000 caloras se necesitan 28 gramos de [place]. Siga las pautas para esta dieta para aumentar la ingesta de [place]. Es importante que aumente lentamente la cantidad de fibra para evitar los gases, la constipacin y la hinchazn.   Elija panes, cereales, pastas con salvado y Cristino Martes.   Elija frutas y vegetales frescos con su piel. No cocine demasiado los vegetales porque cunto ms los cocine, ms fibra pierden.   Consuma ms frutos secos, semillas, legumbres, arvejas secas, lentejas.   Busque en las etiquetas de Informacin Nutricional, los productos que tengan ms de 3 gramos de fibra por porcin.   Tome todos los medicamentos tal como se lo indic el profesional que lo asiste.   Si el mdico le ha dado fecha para una visita de control, es importante que concurra. No cumplir con este control puede dar como resultado que el dao, el dolor o la discapacidad sean permanentes(crnicos) . Si hay algn problema para  cumplir con los controles, comunquelo a su mdico.  SOLICITE ATENCIN MDICA SI:  El dolor no desaparece segn lo esperado.   No puede pasar de la dieta lquida.   Los movimientos intestinales no vuelven a la normalidad.  SOLICITE ATENCIN MDICA DE INMEDIATO SI:  El dolor se agrava.   Usted tiene una temperatura oral de ms de 102 F (38.9 C) y  no puede controlarla con medicamentos.   Presenta vmitos repetidas veces.   Observa la materia fecal tiene aspecto negro alquitranado o contiene sangre de color rojo brillante.   Si los sntomas que lo trajeron a Stage manager o no mejoran.  EST SEGURO QUE:   Comprende las instrucciones para el alta mdica.   Controlar su enfermedad.   Solicitar atencin mdica de inmediato segn las indicaciones.  Document Released: 04/29/2005 Document Revised: 04/01/2011 [facility] Patient Information 2012 [place], [place].

## 2011-07-17 NOTE — ED Notes (Signed)
Report given to Perry Mount, RN

## 2011-07-20 ENCOUNTER — Ambulatory Visit (INDEPENDENT_AMBULATORY_CARE_PROVIDER_SITE_OTHER): Payer: BC Managed Care – PPO | Admitting: Internal Medicine

## 2011-07-20 ENCOUNTER — Telehealth (INDEPENDENT_AMBULATORY_CARE_PROVIDER_SITE_OTHER): Payer: Self-pay | Admitting: Surgery

## 2011-07-20 DIAGNOSIS — K5732 Diverticulitis of large intestine without perforation or abscess without bleeding: Secondary | ICD-10-CM

## 2011-07-20 DIAGNOSIS — K5792 Diverticulitis of intestine, part unspecified, without perforation or abscess without bleeding: Secondary | ICD-10-CM

## 2011-07-20 NOTE — Assessment & Plan Note (Addendum)
Recent documented R sided diverticulitis, pt states had similar sx few years ago. D/w pt what diverticulitis is and possible recurrence Plan:  cont abx, having some issues w/ tolerance (apparently GERD type of sx). If unable to finish the abx will call and she will be switch to augmentin Call or Er if sx resurface.  prilosec

## 2011-07-20 NOTE — Patient Instructions (Signed)
prilosec 20 mg OTC 1 al dia , tomela con el estomago vacio x 2 o 3 semanas \

## 2011-07-20 NOTE — Progress Notes (Signed)
  Subjective:    Patient ID: Jillian Chandler, female    DOB: 1950-08-15, 60 y.o.   MRN: [id number]  HPI Here for followup from the ER Developed abdominal pain, initially in the upper abdomen and then located on the right side, she also had nausea, vomiting, no fevers or chills. Eventually went to the ER 07/16/2011, chart reviewed, white count was elevated, CT of the abdomen show no appendicitis but rather right-sided diverticulitis. She was released after she felt better and is currently taking antibiotics. She is on flagyl ans  Cipro, she thinks is having side effects, when asked for specific symptoms she said heartburn and some epigastric burning.  Past Medical History: Breast cancer, Dx June 2010--status post mastectomy, was intolerant Arimidex  Hypertension-- Dx 2006  Past Surgical History: Mastectomy - left 04-2009  Family History: MI-- F MI age 54 CAD--  M age of onset 67 (?) Stroke--no DM-- brother, sister  Colon ca--no Breast ca-- no  Social History: Occupation: Financial controller, Pharmacologist Married children x 4  from [place] Tobacco-- quit 2005,< <1 ppd ETOH-- no    Review of Systems Since she left the ER, overall she is improving. Abdominal pain has resolved, no fever chills Nausea almost gone, no vomiting no diarrhea.    Objective:   Physical Exam  Constitutional: She is oriented to person, place, and time. She appears well-developed.  HENT:  Head: Normocephalic and atraumatic.  Eyes:       Not pale or icteric  Cardiovascular: Normal rate, regular rhythm and normal heart sounds.   No murmur heard. Pulmonary/Chest: Breath sounds normal. No respiratory distress. She has no wheezes. She has no rales. She exhibits no tenderness.  Abdominal: Soft. Bowel sounds are normal. She exhibits no distension. There is no tenderness. There is no rebound and no guarding.  Musculoskeletal: She exhibits no edema.  Neurological: She is alert and oriented to person, place,  and time.  Psychiatric: She has a normal mood and affect. Her behavior is normal. Judgment and thought content normal.       Assessment & Plan:

## 2011-07-20 NOTE — Assessment & Plan Note (Addendum)
Did not go to GI for a Cscope, now developed her second diverticulitis Re-refer to GI (HP)

## 2011-07-21 ENCOUNTER — Other Ambulatory Visit: Payer: BC Managed Care – PPO | Admitting: Gastroenterology

## 2011-07-23 ENCOUNTER — Encounter: Payer: Self-pay | Admitting: Internal Medicine

## 2011-08-17 ENCOUNTER — Encounter: Payer: Self-pay | Admitting: *Deleted

## 2011-08-17 ENCOUNTER — Encounter: Payer: BC Managed Care – PPO | Attending: Internal Medicine | Admitting: *Deleted

## 2011-08-17 DIAGNOSIS — E119 Type 2 diabetes mellitus without complications: Secondary | ICD-10-CM | POA: Insufficient documentation

## 2011-08-17 DIAGNOSIS — Z713 Dietary counseling and surveillance: Secondary | ICD-10-CM | POA: Insufficient documentation

## 2011-08-17 NOTE — Progress Notes (Signed)
Medical Nutrition Therapy:  Appt start time: 1530   End time:  1700.  Assessment:  T2DM, New onset. Pt here with spouse for diabetes assessment and education. A1c of 6.7% at dx, 06/18/11. Pt reports frequent headaches and h/o tingling in hands until dx. Changed diet and tingling ceased. Pt believes this it d/t prayer, not diet. Pt desires wt loss. Overall food choices ok, but consumes large portions of rice, pasta, and other high CHO foods. 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 No physical activity noted at this time. Pt has not had eye exam in 7 yrs and is not checking BG at this time. Visit interpreted by Farrel Gobble from [place]; spouse speaks some English.  MEDICATIONS: See medication list; reconciled with pt and spouse.   DIETARY INTAKE:  Usual eating pattern includes 1-2 meals and 0-1 snacks per day. Consumes grilled chicken, carrots, [place], rice at most meals, spaghetti ("a lot" per pt and spouse); drinks water only. Used to drink water with small amount of sprite. Changed diet after diverticulitis flare this year.  24-hr recall: Unable to obtain full recall d/t multiple questions and redirection from pt and spouse.   Usual physical activity: No structured activity noted  Estimated energy needs: 1100-1200 calories 120-135 g carbohydrates 80-90 g protein 30-35 g fat  Progress Towards Goal(s):  In progress.   Nutritional Diagnosis:  [place]-2.1 Impaired nutrient utilization related to glucose metabolism as evidenced by A1c of 6.7% and recent diagnosis of T2DM.    Intervention/Goals:  Follow Diabetes Meal Plan as instructed (see yellow card).  Eat 3 meals and 2 snacks; AVOID meal skipping.  Limit carbohydrate intake to 45 grams carbohydrate/meal.  Limit carbohydrate intake to 15 grams carbohydrate/snack.  Add lean protein foods to all meals/snacks.  Aim for 30 mins or more of physical activity most days.  Avoid sugar-sweetened drinks, high carbohydrate foods, and  eating out.  Aim for 25-30 grams of fiber a day.  Handouts given during visit include:  Living Well with Diabetes (Spanish translation) - [employer]  30 g menu plan/Snack List (English)  Mr. Idell Pickles Quick and Easy Diabetic Cooking (cookbook)  Fiber Foods List (English)  Monitoring/Evaluation:  Dietary intake, exercise, A1c (as available), and body weight in 6 week(s).

## 2011-08-17 NOTE — Patient Instructions (Addendum)
Goals:  Follow Diabetes Meal Plan as instructed (see yellow card).  Eat 3 meals and 2 snacks; AVOID meal skipping.  Limit carbohydrate intake to 45 grams carbohydrate/meal.  Limit carbohydrate intake to 15 grams carbohydrate/snack.  Add lean protein foods to all meals/snacks.  Aim for 30 mins or more of physical activity most days.  Avoid sugar-sweetened drinks, high carbohydrate foods, and eating out.  Aim for 25-30 grams of fiber a day.

## 2011-08-18 ENCOUNTER — Encounter: Payer: Self-pay | Admitting: *Deleted

## 2011-08-31 ENCOUNTER — Ambulatory Visit (INDEPENDENT_AMBULATORY_CARE_PROVIDER_SITE_OTHER): Payer: BC Managed Care – PPO | Admitting: Surgery

## 2011-08-31 ENCOUNTER — Encounter (INDEPENDENT_AMBULATORY_CARE_PROVIDER_SITE_OTHER): Payer: Self-pay | Admitting: Surgery

## 2011-08-31 VITALS — BP 112/74 | HR 72 | Temp 97.4°F | Resp 16 | Ht 67.0 in | Wt 181.6 lb

## 2011-08-31 DIAGNOSIS — Z853 Personal history of malignant neoplasm of breast: Secondary | ICD-10-CM

## 2011-08-31 NOTE — Patient Instructions (Signed)
Follow up march 2014

## 2011-08-31 NOTE — Progress Notes (Signed)
NAME: [name]       DOB: July 26, 1951           DATE: 08/31/2011       MRN: [id number]   [name] is a 61 y.o.Jillian Kitchenfemale who presents for routine followup of her  left breast cancer stage 1 diagnosed in 2010 and treated with left mastectomy. She has no problems or concerns on either side.  PFSH: She has had no significant changes since the last visit here.  ROS: There have been no significant changes since the last visit here  EXAM: General: The patient is alert, oriented, generally healty appearing, NAD. Mood and affect are normal.  Breasts:  Left breast surgically absent.  Soreness around the scar no mass  Right breast normal  Lymphatics: She has no axillary or supraclavicular adenopathy on either side.  Extremities: Full ROM of the surgical side with no lymphedema noted.  Data Reviewed: Mammo from 05/2011 birads 2  Impression: Doing well, with no evidence of recurrent cancer or new cancer  Plan: Will continue to follow up on an annual basis here.

## 2011-09-01 ENCOUNTER — Other Ambulatory Visit: Payer: BC Managed Care – PPO | Admitting: Gastroenterology

## 2011-09-24 ENCOUNTER — Other Ambulatory Visit: Payer: Self-pay | Admitting: Internal Medicine

## 2011-09-24 NOTE — Telephone Encounter (Signed)
Refill done.  

## 2011-09-28 ENCOUNTER — Ambulatory Visit: Payer: BC Managed Care – PPO | Admitting: *Deleted

## 2011-11-02 HISTORY — PX: COLONOSCOPY W/ POLYPECTOMY: SHX1380

## 2011-11-05 ENCOUNTER — Other Ambulatory Visit: Payer: Self-pay | Admitting: Internal Medicine

## 2011-11-05 NOTE — Telephone Encounter (Signed)
Refill done.  

## 2011-11-17 ENCOUNTER — Other Ambulatory Visit: Payer: Self-pay | Admitting: Internal Medicine

## 2011-11-17 ENCOUNTER — Ambulatory Visit (HOSPITAL_BASED_OUTPATIENT_CLINIC_OR_DEPARTMENT_OTHER): Payer: BC Managed Care – PPO | Admitting: Oncology

## 2011-11-17 ENCOUNTER — Other Ambulatory Visit (HOSPITAL_BASED_OUTPATIENT_CLINIC_OR_DEPARTMENT_OTHER): Payer: BC Managed Care – PPO | Admitting: Lab

## 2011-11-17 VITALS — BP 126/72 | HR 64 | Temp 97.0°F | Ht 67.0 in | Wt 168.0 lb

## 2011-11-17 DIAGNOSIS — Z170 Estrogen receptor positive status [ER+]: Secondary | ICD-10-CM

## 2011-11-17 DIAGNOSIS — C50919 Malignant neoplasm of unspecified site of unspecified female breast: Secondary | ICD-10-CM

## 2011-11-17 DIAGNOSIS — D0590 Unspecified type of carcinoma in situ of unspecified breast: Secondary | ICD-10-CM

## 2011-11-17 DIAGNOSIS — Z853 Personal history of malignant neoplasm of breast: Secondary | ICD-10-CM

## 2011-11-17 LAB — COMPREHENSIVE METABOLIC PANEL
ALT: 34 U/L (ref 0–35)
AST: 20 U/L (ref 0–37)
Chloride: 98 mEq/L (ref 96–112)
Creatinine, Ser: 0.76 mg/dL (ref 0.50–1.10)
Sodium: 136 mEq/L (ref 135–145)
Total Bilirubin: 0.4 mg/dL (ref 0.3–1.2)
Total Protein: 7.5 g/dL (ref 6.0–8.3)

## 2011-11-17 LAB — CBC WITH DIFFERENTIAL/PLATELET
BASO%: 0.6 % (ref 0.0–2.0)
Eosinophils Absolute: 0.2 10*3/uL (ref 0.0–0.5)
LYMPH%: 41.1 % (ref 14.0–49.7)
MCHC: 32.6 g/dL (ref 31.5–36.0)
MONO#: 0.6 10*3/uL (ref 0.1–0.9)
MONO%: 7 % (ref 0.0–14.0)
NEUT#: 4.2 10*3/uL (ref 1.5–6.5)
Platelets: 315 10*3/uL (ref 145–400)
RBC: 4.57 10*6/uL (ref 3.70–5.45)
RDW: 14.1 % (ref 11.2–14.5)
WBC: 8.5 10*3/uL (ref 3.9–10.3)
nRBC: 0 % (ref 0–0)

## 2011-11-17 NOTE — Progress Notes (Signed)
ID: Jillian Chandler   DOB: 08/01/1951  MR#: [id number]  WUJ#:[id number]  HISTORY OF PRESENT ILLNESS: The patient had not had mammography for approximately 10 years.  More recently, she felt her breasts were a little bit more full than usual and she was set up for mammography I believe at [place] (the former [facility]).  I do not have that report, but she was recalled, brought back for a diagnostic mammogram because of microcalcifications and core biopsy was obtained January 08, 2009 showing ([id number]) an area of ductal carcinoma in situ with necrosis, high grade, ER positive at 100%, PR positive at 19%.    The patient was referred to Dr. Luisa Chandler and bilateral breast MRIs were obtained June 23rd.  They showed the biopsy clip in the medial left breast and a 7 mm enhancing nodule near that.  There was a complex parenchymal pattern bilaterally, right greater than left, and there were several nodules in the right breast which were felt to be a little bit suspicious.  There was also a 1.2 cm enhancing area in the sternum with sternal metastases not excluded.  The patient had a right breast ultrasound showing no sonographic evidence of malignancy.  There was only a 6 mm normal appearing intramammary lymph node.  On August 4th, she had biopsy of the suspicious area on the right and this showed a fibroadenoma with no malignant features ([id number]).    Her subsequent evaluation and treatment is as detailed below.  INTERVAL HISTORY: tthe patient returns today for routine breast cancer followup. Interval history is unremarkable. She continues to work at [employer]. Family is doing fine. Her husband was diagnosed with a skin cancer but it was not melanoma and "he is okay"  REVIEW OF SYSTEMS: She has occasional headaches. She admits to some sinus symptoms and also to stress at her job. She has some use arthritis involving her right wrist and right shoulder areas. Otherwise a detailed review of systems  today was noncontributory  PAST MEDICAL HISTORY: Past Medical History  Diagnosis Date  . Hypertension 2006  . Diabetes mellitus 06/2011    Dr. Silvano Chandler  . Diverticulitis     Per patient   . Breast cancer 01/2009    left brest- status post mastectomy, intolerant to Arimidex, declined tamoxfien  . Breast cancer   Significant for hypertension, osteoarthritis, history of migraines and status post C-section.    PAST SURGICAL HISTORY: Past Surgical History  Procedure Date  . Mastectomy 04/2009  . Mastectomy     FAMILY HISTORY Family History  Problem Relation Age of Onset  . Heart attack Father 44    F age 51, M age 17s  . Stroke Neg Hx   . Colon cancer Neg Hx   . Breast cancer Neg Hx   . Diabetes Sister   . Diabetes Brother   . Diabetes Maternal Aunt   . Diabetes Maternal Uncle   . Diabetes Paternal Aunt   . Diabetes Paternal Uncle   . Diabetes Maternal Grandmother   . Diabetes Maternal Grandfather   . Diabetes Paternal Grandmother   . Diabetes Paternal Grandfather   . Diabetes Sister   The patient's father died at the age of 90 from heart disease.  The patient's mother died at the age of 58 from myocardial infarction.  The patient also has a sister who died from a subarachnoid hemorrhage.  Another one died from complications of diabetes. She has two surviving sisters and three brothers.  There is  no history of breast or ovarian cancer in the family.    GYNECOLOGIC HISTORY: The patient carried 4 children to term; first child to term at age 64.  She has been having irregular periods, once every few months for several years now. She is having hot flashes as well.   She is not on any hormone replacement.    SOCIAL HISTORY: The patient works for [employer] in [employer].  Her husband of 31 years, Jillian Chandler (same last name) works for [employer] in [place]. The patient's daughter, Jillian Chandler, who is a Clinical research associate and passed the bar in [place], has moved to this area.  She is  present today and she is studying for the bar here.  A son, 81, lives in [place].  His name is Jillian Chandler.  A son, Jillian Chandler, is currently attending [employer] trying to get his GED.  He is 37 years old.  Son Jillian Chandler attends [employer] and hopes to become a sports medicine specialist.  He is a Print production planner.  He is 20.  The patient was born in [place]. She belongs to an [employer].    ADVANCED DIRECTIVES:  HEALTH MAINTENANCE: History  Substance Use Topics  . Smoking status: Former Games developer  . Smokeless tobacco: Never Used  . Alcohol Use: No     Colonoscopy: never  PAP: UTD  Bone density: Fernandez/nl  Lipid panel:  No Known Allergies  Current Outpatient Prescriptions  Medication Sig Dispense Refill  . alum hydroxide-mag trisilicate (GAVISCON) 80-20 MG CHEW Chew 1 tablet by mouth 3 (three) times daily as needed. For heart burn        . ciprofloxacin (CIPRO) 500 MG tablet       . HYDROcodone-acetaminophen (NORCO) 5-325 MG per tablet 1-2 tablets po q 6 hours prn moderate to severe pain  30 tablet  0  . lisinopril (PRINIVIL,ZESTRIL) 20 MG tablet TAKE TWO TABLETS BY MOUTH EVERY DAY *NEED BLOOD PRESSURE CHECKED IN TWO WEEKS*  180 tablet  1  . lisinopril (PRINIVIL,ZESTRIL) 40 MG tablet Take 40 mg by mouth daily.        . metoprolol (LOPRESSOR) 50 MG tablet TAKE ONE TABLET BY MOUTH EVERY DAY  90 tablet  0  . hydrochlorothiazide 25 MG tablet Take 25 mg by mouth daily.        . ondansetron (ZOFRAN) 4 MG tablet         OBJECTIVE: Middle-aged Latin American woman in no acute distress Filed Vitals:   11/17/11 1508  BP: 126/72  Pulse: 64  Temp: 97 F (36.1 C)     Body mass index is 26.31 kg/(m^2).    ECOG FS: 0  Sclerae unicteric Oropharynx clear No peripheral adenopathy Lungs no rales or rhonchi Heart regular rate and rhythm Abd benign MSK no focal spinal tenderness, no peripheral edema Neuro: nonfocal Breasts: right breast, no suspicious findings; left breast status post mastectomy, no  evidence of local recurrence  LAB RESULTS: Lab Results  Component Value Date   WBC 8.5 11/17/2011   NEUTROABS 4.2 11/17/2011   HGB 12.8 11/17/2011   HCT 39.3 11/17/2011   MCV 85.9 11/17/2011   PLT 315 11/17/2011      Chemistry      Component Value Date/Time   NA 137 07/16/2011 2332   K 4.0 07/16/2011 2332   CL 98 07/16/2011 2332   CO2 29 07/16/2011 2332   BUN 11 07/16/2011 2332   CREATININE 0.54 07/16/2011 2332      Component Value Date/Time  CALCIUM 10.2 07/16/2011 2332   ALKPHOS 73 07/17/2011 0038   AST 15 07/17/2011 0038   ALT 17 07/17/2011 0038   BILITOT 0.6 07/17/2011 0038       No results found for this basename: LABCA2    No components found with this basename: [id number]    No results found for this basename: INR:1;PROTIME:1 in the last 168 hours  Urinalysis    Component Value Date/Time   COLORURINE YELLOW 07/17/2011 0031   APPEARANCEUR CLEAR 07/17/2011 0031   LABSPEC 1.008 07/17/2011 0031   PHURINE 7.0 07/17/2011 0031   GLUCOSEU NEGATIVE 07/17/2011 0031   HGBUR NEGATIVE 07/17/2011 0031   BILIRUBINUR NEGATIVE 07/17/2011 0031   BILIRUBINUR neg 11/25/2010   KETONESUR NEGATIVE 07/17/2011 0031   PROTEINUR NEGATIVE 07/17/2011 0031   UROBILINOGEN 0.2 07/17/2011 0031   UROBILINOGEN neg 11/25/2010   NITRITE NEGATIVE 07/17/2011 0031   NITRITE neg 11/25/2010   LEUKOCYTESUR NEGATIVE 07/17/2011 0031    STUDIES: No newresults found. Next mammogram is due in October  ASSESSMENT: 61 year old Spanish speaker, status post left breast biopsy June of 2010 for high-grade ductal carcinoma in situ, estrogen receptor 100% positive, progesterone receptor positive at 19%, status post left mastectomy and sentinel lymph node sampling September of 2010.  There was a less than 1 mm area of microinvasion along with the ductal carcinoma in situ, margins were ample, and 1/3 sentinel lymph nodes had some isolated tumor cells.  She tried anastrozole briefly between October of 2010 and  March of 2011 at which time it was discontinued because of myalgias/arthralgias.  She is now being followed with observation alone.   PLAN: there is no evidence of disease recurrence. We are going to start seeing her on a once a year basis until she completes her 5 years of followup here. She knows to call for any problems that may develop before the next visit.   Lennie Vasco C    11/17/2011

## 2011-11-17 NOTE — Telephone Encounter (Signed)
Refill done.  

## 2011-11-18 ENCOUNTER — Telehealth: Payer: Self-pay | Admitting: Oncology

## 2011-11-18 NOTE — Telephone Encounter (Signed)
gve a copy of the pt's schedule for 2014 to julia the spanish interpreter to call the pt. Pt's vm had a spanish message and did not want to leave the information on her vm in case she did not understand me. S/w luwanna from clinical social work who will set up the iinterpreter for the appts

## 2011-11-20 ENCOUNTER — Emergency Department (HOSPITAL_COMMUNITY): Payer: BC Managed Care – PPO

## 2011-11-20 ENCOUNTER — Encounter (HOSPITAL_COMMUNITY): Payer: Self-pay | Admitting: Emergency Medicine

## 2011-11-20 ENCOUNTER — Emergency Department (HOSPITAL_COMMUNITY)
Admission: EM | Admit: 2011-11-20 | Discharge: 2011-11-20 | Disposition: A | Discharge status: Home / Self Care | Payer: BC Managed Care – PPO | Attending: Emergency Medicine | Admitting: Emergency Medicine

## 2011-11-20 DIAGNOSIS — M549 Dorsalgia, unspecified: Secondary | ICD-10-CM | POA: Insufficient documentation

## 2011-11-20 DIAGNOSIS — I10 Essential (primary) hypertension: Secondary | ICD-10-CM | POA: Insufficient documentation

## 2011-11-20 DIAGNOSIS — R509 Fever, unspecified: Secondary | ICD-10-CM | POA: Insufficient documentation

## 2011-11-20 DIAGNOSIS — R51 Headache: Secondary | ICD-10-CM | POA: Insufficient documentation

## 2011-11-20 DIAGNOSIS — J3489 Other specified disorders of nose and nasal sinuses: Secondary | ICD-10-CM | POA: Insufficient documentation

## 2011-11-20 DIAGNOSIS — E119 Type 2 diabetes mellitus without complications: Secondary | ICD-10-CM | POA: Insufficient documentation

## 2011-11-20 DIAGNOSIS — M25519 Pain in unspecified shoulder: Secondary | ICD-10-CM | POA: Insufficient documentation

## 2011-11-20 DIAGNOSIS — IMO0001 Reserved for inherently not codable concepts without codable children: Secondary | ICD-10-CM | POA: Insufficient documentation

## 2011-11-20 DIAGNOSIS — J020 Streptococcal pharyngitis: Secondary | ICD-10-CM

## 2011-11-20 LAB — URINALYSIS, ROUTINE W REFLEX MICROSCOPIC
Bilirubin Urine: NEGATIVE
Glucose, UA: NEGATIVE mg/dL
Ketones, ur: NEGATIVE mg/dL
Protein, ur: NEGATIVE mg/dL
pH: 5 (ref 5.0–8.0)

## 2011-11-20 LAB — RAPID STREP SCREEN (MED CTR MEBANE ONLY): Streptococcus, Group A Screen (Direct): POSITIVE — AB

## 2011-11-20 LAB — URINE MICROSCOPIC-ADD ON

## 2011-11-20 MED ORDER — ACETAMINOPHEN 325 MG PO TABS
650.0000 mg | ORAL_TABLET | Freq: Once | ORAL | Status: AC
  Administered 2011-11-20: 650 mg via ORAL
  Filled 2011-11-20: qty 2

## 2011-11-20 MED ORDER — HYDROCODONE-ACETAMINOPHEN 7.5-500 MG/15ML PO SOLN: 10.0000 mL | Freq: Four times a day (QID) | ORAL | Status: AC | PRN

## 2011-11-20 MED ORDER — PENICILLIN G BENZATHINE 1200000 UNIT/2ML IM SUSP
1.2000 10*6.[IU] | Freq: Once | INTRAMUSCULAR | Status: AC
  Administered 2011-11-20: 1.2 10*6.[IU] via INTRAMUSCULAR
  Filled 2011-11-20: qty 2

## 2011-11-20 MED ORDER — HYDROCODONE-ACETAMINOPHEN 5-325 MG PO TABS
1.0000 | ORAL_TABLET | Freq: Once | ORAL | Status: AC
  Administered 2011-11-20: 1 via ORAL
  Filled 2011-11-20: qty 1

## 2011-11-20 NOTE — Discharge Instructions (Signed)
YOU HAVE BEEN GIVEN ALL THE ANTIBIOTIC YOU NEED TO TREAT YOUR CONDITION - DO NOT TAKE ANY MORE OF YOUR HUSBAND'S AMOXICILLIN. TAKE HYDROCODONE ELIXIR FOR COMFORT, AND TYLENOL 650 MG AS NEEDED FOR FEVER. PUSH FLUIDS. RETURN HERE AS NEEDED.  Infeccin estreptoccica (Strep Infections) Las infecciones estreptotoccicas estn causadas por grmenes estreptoccicos (bacteria). Estas infecciones son muy contagiosas. Pueden producirse en:  Los odos.   Olena Heckle.   La garganta.   Los senos nasales.   La piel.   Sangre.   Pulmones.   Lquido espinal.   Mason Jim.  La infeccin en la garganta por estreptococos es la infeccin bacterial ms frecuente en los nios. Los sntomas generalmente mejoran en 2 a 3 das luego de Electrical engineer administracin de medicamentos que [employer] grmenes (antibiticos). Generalmente el estreptococo no se contagia luego de 36 a 48 horas del tratamiento con antibiticos. Las infecciones no tratadas pueden causar complicaciones graves. Por ejemplo, infecciones glandulares, abscesos en la garganta, fiebre reumtica e infeccin en los riones. DIAGNSTICO El diagnstico por lo general se realiza:  Por cultivo del germen del estreptococo.  TRATAMIENTO Estas infecciones requieren antibiticos por va oral durante 10 das completos, una inyeccin de antibitico o su administracin por va intravenosa. INSTRUCCIONES PARA EL CUIDADO DOMICILIARIO  Asegrese de terminar el tratamiento antibitico, incluso si se siente mejor.   Slo tome medicamentos de venta libre o los que le prescriba su mdico para Engineer, materials, el malestar o la fiebre, segn las indicaciones.   Las Control and instrumentation engineer que tengan fiebre, dolor de garganta o sntomas de la enfermedad debern ver al mdico de inmediato.   Usted o el nio podrn volver al Aleen Campi o a la escuela si la fiebre y Personnel officer en 2 a 3 das luego de comenzar con los antibiticos.  SOLICITE ATENCIN MDICA SI:  Usted o  su nio tienen una temperatura oral de ms de 102 F (38.9 C).   El beb tiene ms de 3 meses y su temperatura rectal es de 100.5 F (38.1 C) o ms durante ms de 1 da.   Los sntomas no mejoran en el trmino de [address].  SOLICITE ATENCIN MDICA DE INMEDIATO SI:  Usted o su nio tienen una temperatura oral de ms de 102 F (38.9 C) y no puede controlarla con medicamentos.   Su beb tiene ms de 3 meses y su temperatura rectal es de 102 F (38.9 C) o ms.   Su beb tiene 3 meses o menos y su temperatura rectal es de 100.4 F (38 C) o ms.   Aparece un sarpullido que se expande.   Tiene dificultad para tragar o respirar.   Presenta hinchazn o aumento del dolor.  Document Released: 07/20/2005 Document Revised: 07/09/2011 [facility] Patient Information 2012 [place], [place].  Grgaras de agua con sal ([place] Gargle) Esta solucin har que sienta alivio en la boca y la garganta. INSTRUCCIONES PARA EL CUIDADO DOMICILIARIO  Mezcle 1 cucharadita de sal en 8 onzas de agua tibia.   Haga grgaras con esta solucin con la frecuencia que necesite o segn le hayan indicado. Hgalo suavemente si tiene lesiones o lastimaduras en la boca.   No trague esta solucin.  Document Released: 11/05/2008 Document Revised: 07/09/2011 [facility] Patient Information 2012 [place], [place].

## 2011-11-20 NOTE — ED Notes (Signed)
The pt  Has had her pen injection.  She needs to wait for approx 20  Minutes  To check for a reaction

## 2011-11-20 NOTE — ED Notes (Signed)
Med  Given for temp and pain.  She is staying wrapped up in a blanket and her coat.  i have asked her to take some of the cover off.  She speaks little english but her son with her speaks very well

## 2011-11-20 NOTE — ED Notes (Signed)
Pt states she took 2 Tylenol at 5:45pm.

## 2011-11-20 NOTE — ED Provider Notes (Signed)
History     CSN: [id number]  Arrival date & time 11/20/11  Paulo Fruit   First MD Initiated Contact with Patient 11/20/11 2048      Chief Complaint  Patient presents with  . Shoulder Pain  . Back Pain  . Fever    (Consider location/radiation/quality/duration/timing/severity/associated sxs/prior treatment) Patient is a 61 y.o. female presenting with shoulder pain, back pain, and fever. The history is provided by the patient.  Shoulder Pain This is a chronic problem. The problem occurs constantly. The problem has been gradually worsening. Associated symptoms include congestion, a fever, headaches and a sore throat. Pertinent negatives include no chills, nausea, rash or vomiting. Associated symptoms comments: She has joint pain usually that is worse with the onset of febrile illness yesterday. No new injury. She complains of sore throat, body aches as well as worsening joint pain. No N, V, or cough.. The symptoms are aggravated by nothing. She has tried acetaminophen for the symptoms. The treatment provided no relief.  Back Pain  Associated symptoms include a fever and headaches.  Fever Primary symptoms of the febrile illness include fever and headaches. Primary symptoms do not include nausea, vomiting or rash.    Past Medical History  Diagnosis Date  . Hypertension 2006  . Diabetes mellitus 06/2011    Dr. Silvano Bilis  . Diverticulitis     Per patient   . Breast cancer 01/2009    left brest- status post mastectomy, intolerant to Arimidex, declined tamoxfien  . Breast cancer     Past Surgical History  Procedure Date  . Mastectomy 04/2009  . Mastectomy     Family History  Problem Relation Age of Onset  . Heart attack Father 18    F age 51, M age 25s  . Stroke Neg Hx   . Colon cancer Neg Hx   . Breast cancer Neg Hx   . Diabetes Sister   . Diabetes Brother   . Diabetes Maternal Aunt   . Diabetes Maternal Uncle   . Diabetes Paternal Aunt   . Diabetes Paternal Uncle   . Diabetes  Maternal Grandmother   . Diabetes Maternal Grandfather   . Diabetes Paternal Grandmother   . Diabetes Paternal Grandfather   . Diabetes Sister     History  Substance Use Topics  . Smoking status: Former Games developer  . Smokeless tobacco: Never Used  . Alcohol Use: No    OB History    Grav Para Term Preterm Abortions TAB SAB Ect Mult Living                  Review of Systems  Constitutional: Positive for fever. Negative for chills.  HENT: Positive for congestion and sore throat.   Respiratory: Negative.   Cardiovascular: Negative.   Gastrointestinal: Negative.  Negative for nausea and vomiting.  Musculoskeletal: Positive for back pain.  Skin: Negative.  Negative for rash.  Neurological: Positive for headaches.    Allergies  Review of patient's allergies indicates no known allergies.  Home Medications   Current Outpatient Rx  Name Route Sig Dispense Refill  . DEXLANSOPRAZOLE 60 MG PO CPDR Oral Take 60 mg by mouth daily.    Marland Kitchen HYDROCHLOROTHIAZIDE 25 MG PO TABS Oral Take 25 mg by mouth daily.    Marland Kitchen LISINOPRIL 20 MG PO TABS Oral Take 40 mg by mouth daily.    Marland Kitchen METOPROLOL TARTRATE 50 MG PO TABS Oral Take 150 mg by mouth every morning.    . SUCRALFATE 1 G PO  TABS Oral Take 1 g by mouth 2 (two) times daily.      BP 145/81  Pulse 110  Temp(Src) 101.7 F (38.7 C) (Oral)  Resp 16  SpO2 97%  Physical Exam  Constitutional: She appears well-developed and well-nourished.  HENT:  Head: Normocephalic.  Right Ear: External ear normal.  Left Ear: External ear normal.  Mouth/Throat: No uvula swelling. Oropharyngeal exudate and posterior oropharyngeal erythema present.       Tonsils swollen and red bilateraly, with minimal purulence on right greater than left.  Neck: Normal range of motion. Neck supple.  Cardiovascular: Normal rate and regular rhythm.   No murmur heard. Pulmonary/Chest: Effort normal and breath sounds normal. She has no rales.  Abdominal: Soft. There is no  tenderness. There is no rebound and no guarding.  Musculoskeletal: Normal range of motion.  Lymphadenopathy:    She has cervical adenopathy.  Neurological: She is alert. No cranial nerve deficit.  Skin: Skin is warm and dry. No rash noted.  Psychiatric: She has a normal mood and affect.    ED Course  Procedures (including critical care time)  Labs Reviewed  URINALYSIS, ROUTINE W REFLEX MICROSCOPIC - Abnormal; Notable for the following:    Hgb urine dipstick TRACE (*)    All other components within normal limits  RAPID STREP SCREEN - Abnormal; Notable for the following:    Streptococcus, Group A Screen (Direct) POSITIVE (*)    All other components within normal limits  URINE MICROSCOPIC-ADD ON   Dg Chest 2 View  11/20/2011  *RADIOLOGY REPORT*  Clinical Data: Shoulder pain.  Back pain.  Fever, cough, hypertension, diabetes.  CHEST - 2 VIEW  Comparison: 05/01/2009  Findings: Cardiomediastinal silhouette is within normal limits. The lungs are free of focal consolidations and pleural effusions. Degenerative changes are seen in the spine.  No pneumothorax.  No free intraperitoneal air.  IMPRESSION: No evidence for acute cardiopulmonary abnormality.  Original Report Authenticated By: Patterson Hammersmith, M.D.     No diagnosis found.  1. Strep throat 2. Arthritis   MDM  She was given IM Bicillin for dx of strep and comfort medications. VS improving. Feel she is stable for discharge home.        Rodena Medin, PA-C 11/20/11 2213

## 2011-11-20 NOTE — ED Notes (Signed)
Pt had jacket and blankets covering her up and a blanket wrapped around her head. Told pt to take them off

## 2011-11-20 NOTE — ED Notes (Signed)
The pt has been ill since yesterday.  She has a cold and she is hurting all over .  Her rt shoulder  Her back and she hasa sorethroat neck pain

## 2011-11-20 NOTE — ED Notes (Signed)
C/o R shoulder pain x 1 week.  Reports lower back pain, neck pain, fever, and sore throat since this morning.

## 2011-11-21 NOTE — ED Provider Notes (Signed)
Medical screening examination/treatment/procedure(s) were performed by non-physician practitioner and as supervising physician I was immediately available for consultation/collaboration.   Nat Christen, MD 11/21/11 [phone]

## 2011-11-24 ENCOUNTER — Ambulatory Visit (INDEPENDENT_AMBULATORY_CARE_PROVIDER_SITE_OTHER): Payer: BC Managed Care – PPO | Admitting: Internal Medicine

## 2011-11-24 ENCOUNTER — Encounter: Payer: Self-pay | Admitting: *Deleted

## 2011-11-24 VITALS — BP 122/62 | HR 72 | Temp 98.0°F | Wt 164.0 lb

## 2011-11-24 DIAGNOSIS — J029 Acute pharyngitis, unspecified: Secondary | ICD-10-CM

## 2011-11-24 MED ORDER — HYDROCHLOROTHIAZIDE 25 MG PO TABS: 25.0000 mg | ORAL_TABLET | Freq: Every day | ORAL | Status: DC

## 2011-11-24 MED ORDER — LISINOPRIL 20 MG PO TABS: 40.0000 mg | ORAL_TABLET | Freq: Every day | ORAL | Status: DC

## 2011-11-24 MED ORDER — METOPROLOL TARTRATE 50 MG PO TABS: 150.0000 mg | ORAL_TABLET | Freq: Every morning | ORAL | Status: DC

## 2011-11-24 NOTE — Progress Notes (Signed)
  Subjective:    Patient ID: Jillian Chandler, female    DOB: 1951/04/22, 61 y.o.   MRN: [id number]  HPI ER followup She started to feel unwell  4 days ago with fever up to 104.7. She also had sore throat . Went to the ER, chart is reviewed, strep test was positive, she got a shot of penicillin and released home.  Past Medical History:  Breast cancer, Dx June 2010--status post mastectomy, was intolerant Arimidex  Hypertension-- Dx 2006  Past Surgical History:  Mastectomy - left 04-2009  Family History:  MI-- F MI age 10  CAD-- M age of onset 25 (?)  Stroke--no  DM-- brother, sister  Colon ca--no  Breast ca-- no  Social History:  Occupation: Financial controller, Pharmacologist  Married  children x 4  from [place]  Tobacco-- quit 2005,< <1 ppd  ETOH-- no    Review of Systems Since she left the emergency room she is doing better. Afebrile for at least 24 hours, sore throat is better, appetite is slightly decreased but she is able to eat well. Arthralgias decreased.     Objective:   Physical Exam General -- alert, well-developed.No apparent distress.  HEENT -- TMs normal, throat w/o redness, uvula midline, no discharge, no trismus or difficulty swallowing. Face symmetric and not tender to palpation, nose not congested Lungs -- normal respiratory effort, no intercostal retractions, no accessory muscle use, and normal breath sounds.   neurologic-- alert & oriented X3 and strength normal in all extremities. Psych-- Cognition and judgment appear intact. Alert and cooperative with normal attention span and concentration.  not anxious appearing and not depressed appearing.       Assessment & Plan:  Strep pharyngitis, She has documented strep pharyngitis, responding well to penicillin. Recommend to call if symptoms or sore fast, she has fever chills, sore throat or difficulty swallowing.  Hypertension, good control, needs refills.

## 2011-11-25 ENCOUNTER — Encounter: Payer: Self-pay | Admitting: Internal Medicine

## 2012-01-25 ENCOUNTER — Ambulatory Visit (INDEPENDENT_AMBULATORY_CARE_PROVIDER_SITE_OTHER): Payer: BC Managed Care – PPO | Admitting: Internal Medicine

## 2012-01-25 VITALS — BP 126/68 | HR 77 | Temp 98.2°F | Wt 163.0 lb

## 2012-01-25 DIAGNOSIS — M1990 Unspecified osteoarthritis, unspecified site: Secondary | ICD-10-CM

## 2012-01-25 DIAGNOSIS — E119 Type 2 diabetes mellitus without complications: Secondary | ICD-10-CM | POA: Insufficient documentation

## 2012-01-25 DIAGNOSIS — I10 Essential (primary) hypertension: Secondary | ICD-10-CM

## 2012-01-25 DIAGNOSIS — K279 Peptic ulcer, site unspecified, unspecified as acute or chronic, without hemorrhage or perforation: Secondary | ICD-10-CM | POA: Insufficient documentation

## 2012-01-25 MED ORDER — HYDROCODONE-ACETAMINOPHEN 5-500 MG PO TABS: 1.0000 | ORAL_TABLET | Freq: Every evening | ORAL | Status: DC | PRN

## 2012-01-25 NOTE — Progress Notes (Signed)
  Subjective:    Patient ID: Jillian Chandler, female    DOB: July 29, 1951, 61 y.o.   MRN: [id number]  HPI Routine office visit Her chief complaint today is shoulder pain, the pain actually involves the whole proximal upper extremity. Sx are  daily, worse at night, increased with arm use. Diabetes, has changed her diet to some extent. She is very active at work. Recently diagnosed with peptic ulcer disease, good compliance with medication. Chart reviewed, CLO test was negative. Hypertension, good medication compliance, reports normal ambulatory BPs.  Past Medical History:  DM, dx 06-2011 a1c 6.7 Breast cancer, Dx June 2010--status post mastectomy, was intolerant Arimidex  Hypertension-- Dx 2006  Gastri ulcer-Gastritis, CLO (-) per EGD ~ 11-2011 H/o diverticulitis ~ 07-2011  Past Surgical History:  Mastectomy - left 04-2009   Family History:  MI-- F MI age 59  CAD-- M age of onset 39 (?)  Stroke--no  DM-- brother, sister  Colon ca--no  Breast ca-- no   Social History:  Occupation: Financial controller, Pharmacologist  Married , children x 4  from [place]  Tobacco-- quit 2005,< <1 ppd  ETOH-- no     Review of Systems Denies neck pain, no upper extremities paresthesias. At this point she does not have abdominal pain, nausea, vomiting. Last week had episode of diarrhea that lasted one day, no blood in the stools.    Objective:   Physical Exam  General -- alert, well-developed. No apparent distress.  Lungs -- normal respiratory effort, no intercostal retractions, no accessory muscle use, and normal breath sounds.   Heart-- normal rate, regular rhythm, no murmur, and no gallop.   Extremities-- no pretibial edema bilaterally . Left shoulder normal, right shoulder without deformities, range of motion and arm elevation slightly decreased mostly due to pain. Neurologic-- alert & oriented X3, neck is full range of motion, motor exam and DTRs symmetric. Gait normal. Psych-- Cognition and  judgment appear intact. Alert and cooperative with normal attention span and concentration.  not anxious appearing and not depressed appearing.         Assessment & Plan:

## 2012-01-25 NOTE — Patient Instructions (Addendum)
Para el dolor , tome TYLENOL 2 veces al dia Para el dolor de noche tome VICODIN una o dos tabletas al acostartse  No tome motrin - advil

## 2012-01-25 NOTE — Assessment & Plan Note (Addendum)
Gastri ulcer-Gastritis, CLO (-) per EGD ~ 11-2011 Followup EGD showed healing of the ulcer. Currently asx, continue with PPIs

## 2012-01-25 NOTE — Assessment & Plan Note (Signed)
Dx [phone] w/ a1c of 6.7 Diet discussed, labs

## 2012-01-25 NOTE — Assessment & Plan Note (Signed)
Well controlled 

## 2012-01-25 NOTE — Assessment & Plan Note (Signed)
C/o moderate shoulder pain B x a while, h/o PUD thus will avoid NSAIDs Plan: Ortho Tylenol vicodin at HS

## 2012-01-26 ENCOUNTER — Encounter: Payer: Self-pay | Admitting: Internal Medicine

## 2012-01-29 ENCOUNTER — Encounter: Payer: Self-pay | Admitting: Internal Medicine

## 2012-02-02 ENCOUNTER — Emergency Department (HOSPITAL_COMMUNITY)
Admission: EM | Admit: 2012-02-02 | Discharge: 2012-02-02 | Disposition: A | Discharge status: Home / Self Care | Payer: BC Managed Care – PPO | Attending: Emergency Medicine | Admitting: Emergency Medicine

## 2012-02-02 ENCOUNTER — Emergency Department (HOSPITAL_COMMUNITY): Payer: BC Managed Care – PPO

## 2012-02-02 ENCOUNTER — Encounter (HOSPITAL_COMMUNITY): Payer: Self-pay | Admitting: *Deleted

## 2012-02-02 DIAGNOSIS — E119 Type 2 diabetes mellitus without complications: Secondary | ICD-10-CM | POA: Insufficient documentation

## 2012-02-02 DIAGNOSIS — Z79899 Other long term (current) drug therapy: Secondary | ICD-10-CM | POA: Insufficient documentation

## 2012-02-02 DIAGNOSIS — M7500 Adhesive capsulitis of unspecified shoulder: Secondary | ICD-10-CM | POA: Insufficient documentation

## 2012-02-02 DIAGNOSIS — I10 Essential (primary) hypertension: Secondary | ICD-10-CM | POA: Insufficient documentation

## 2012-02-02 DIAGNOSIS — M25519 Pain in unspecified shoulder: Secondary | ICD-10-CM | POA: Insufficient documentation

## 2012-02-02 DIAGNOSIS — Z853 Personal history of malignant neoplasm of breast: Secondary | ICD-10-CM | POA: Insufficient documentation

## 2012-02-02 MED ORDER — HYDROCODONE-ACETAMINOPHEN 5-500 MG PO TABS: 1.0000 | ORAL_TABLET | Freq: Four times a day (QID) | ORAL | Status: AC | PRN

## 2012-02-02 MED ORDER — CYCLOBENZAPRINE HCL 10 MG PO TABS: 10.0000 mg | ORAL_TABLET | Freq: Two times a day (BID) | ORAL | Status: AC | PRN

## 2012-02-02 NOTE — ED Provider Notes (Signed)
History   This chart was scribed for Gwyneth Sprout, MD by Melba Coon. The patient was seen in room TR06C/TR06C and the patient's care was started at 6:50PM.   CSN: [id number]  Arrival date & time 02/02/12  1817   None     Chief Complaint  Patient presents with  . Shoulder Pain    (Consider location/radiation/quality/duration/timing/severity/associated sxs/prior treatment) HPI Jillian Chandler is a 61 y.o. female who presents to the Emergency Department complaining of constant, moderate to severe right shoulder pain with an onset 3 days ago. Hx translated by daughter of pt. Pt has had pain for 3 months but has progressively worsened since onset. Pt has received injections of hydrocortisone in the past, which alleviated the pain. Tylenol is not alleviating the pain. No HA, fever, neck pain, sore throat, rash, back pain, CP, SOB, abd pain, n/v/d, dysuria, or extremity edema, weakness, numbness, or tingling. No known allergies. No other pertinent medical symptoms.  PCP: Dr. Ethelene Hal  Past Medical History  Diagnosis Date  . Hypertension 2006  . Diabetes mellitus 06/2011    Dr. Silvano Bilis  . Diverticulitis     Per patient   . Breast cancer 01/2009    left brest- status post mastectomy, intolerant to Arimidex, declined tamoxfien  . Breast cancer     Past Surgical History  Procedure Date  . Mastectomy 04/2009  . Mastectomy     Family History  Problem Relation Age of Onset  . Heart attack Father 33    F age 5, M age 49s  . Stroke Neg Hx   . Colon cancer Neg Hx   . Breast cancer Neg Hx   . Diabetes Sister   . Diabetes Brother   . Diabetes Maternal Aunt   . Diabetes Maternal Uncle   . Diabetes Paternal Aunt   . Diabetes Paternal Uncle   . Diabetes Maternal Grandmother   . Diabetes Maternal Grandfather   . Diabetes Paternal Grandmother   . Diabetes Paternal Grandfather   . Diabetes Sister     History  Substance Use Topics  . Smoking status: Former Games developer  . Smokeless  tobacco: Never Used  . Alcohol Use: No    OB History    Grav Para Term Preterm Abortions TAB SAB Ect Mult Living                  Review of Systems 10 Systems reviewed and all are negative for acute change except as noted in the HPI.   Allergies  Review of patient's allergies indicates no known allergies.  Home Medications   Current Outpatient Rx  Name Route Sig Dispense Refill  . ACETAMINOPHEN 500 MG PO TABS Oral Take 1,000 mg by mouth every 6 (six) hours as needed. For pain    . VITAMIN D PO Oral Take 1 tablet by mouth daily.    . OMEGA-3 FATTY ACIDS 1000 MG PO CAPS Oral Take 2 g by mouth daily.    Marland Kitchen HYDROCHLOROTHIAZIDE 25 MG PO TABS Oral Take 25 mg by mouth daily.    Marland Kitchen LISINOPRIL 20 MG PO TABS Oral Take 40 mg by mouth daily.    Marland Kitchen METOPROLOL TARTRATE 50 MG PO TABS Oral Take 50 mg by mouth daily.    . CYCLOBENZAPRINE HCL 10 MG PO TABS Oral Take 1 tablet (10 mg total) by mouth 2 (two) times daily as needed for muscle spasms. 20 tablet 0  . HYDROCODONE-ACETAMINOPHEN 5-500 MG PO TABS Oral Take 1-2 tablets by mouth  every 6 (six) hours as needed for pain. 15 tablet 0    BP 108/61  Pulse 70  Temp 98.9 F (37.2 C)  Resp 19  SpO2 96%  Physical Exam  Nursing note and vitals reviewed. Constitutional: She is oriented to person, place, and time. She appears well-developed and well-nourished. No distress.  HENT:  Head: Normocephalic and atraumatic.  Right Ear: External ear normal.  Left Ear: External ear normal.  Eyes: EOM are normal.  Neck: Normal range of motion. No tracheal deviation present.  Cardiovascular: Intact distal pulses.        Nml pulses.  Musculoskeletal: She exhibits tenderness (severe right shoulder pain with internal and external rotation as well as abduction and adduction).       Right shoulder: She exhibits decreased range of motion and tenderness. She exhibits no bony tenderness, no effusion, no crepitus, normal pulse and normal strength.  Neurological: She  is alert and oriented to person, place, and time.  Skin: Skin is warm and dry. No rash noted.  Psychiatric: She has a normal mood and affect. Her behavior is normal.    ED Course  Procedures (including critical care time)  DIAGNOSTIC STUDIES: Oxygen Saturation is 100% on room air, normal by my interpretation.    COORDINATION OF CARE:  6:55PM - EDMD thinks that pt has a frozen shoulder and that pt should receive more muscle relaxant injections from PCP; pt ready for d/c.   Labs Reviewed - No data to display Dg Shoulder Right  02/02/2012  *RADIOLOGY REPORT*  Clinical Data: Right shoulder pain.  RIGHT SHOULDER - 2+ VIEW  Comparison: None.  Findings: No acute bony abnormality.  Specifically, no fracture, subluxation, or dislocation.  Soft tissues are intact.  IMPRESSION: No acute bony abnormality.  Original Report Authenticated By: Cyndie Chime, M.D.     1. Shoulder pain   2. Frozen shoulder       MDM   Pt with worsening pain in the right shoulder which is new to involve the neck and the side of her chest as well. She has severe pain with any range of motion of the shoulder. She has had cortisone injections in the past but nothing recently. She denies any injury and she has a normal plain film. Discussed with her followup for another injection if she strained to exhibit signs are adhesive capsulitis. Patient was encouraged to range her arm and was given pain control. I personally performed the services described in this documentation, which was scribed in my presence.  The recorded information has been reviewed and considered.         Gwyneth Sprout, MD 02/02/12 [phone]

## 2012-02-02 NOTE — ED Notes (Signed)
The pt is c/o rt shoulder pain for 3 months and she has had this numerus times in the past and she has had a cortisone shot that has  Stopped the pain.  The pain is severe with movement.  She is holding her arm to stop the movement

## 2012-02-02 NOTE — ED Notes (Signed)
Patient transported to X-ray 

## 2012-02-08 ENCOUNTER — Encounter: Payer: Self-pay | Admitting: Internal Medicine

## 2012-02-17 ENCOUNTER — Encounter: Payer: Self-pay | Admitting: Gynecology

## 2012-02-17 ENCOUNTER — Other Ambulatory Visit (HOSPITAL_COMMUNITY)
Admission: RE | Admit: 2012-02-17 | Discharge: 2012-02-17 | Disposition: A | Discharge status: Home / Self Care | Payer: BC Managed Care – PPO | Source: Ambulatory Visit | Attending: Gynecology | Admitting: Gynecology

## 2012-02-17 ENCOUNTER — Ambulatory Visit (INDEPENDENT_AMBULATORY_CARE_PROVIDER_SITE_OTHER): Payer: BC Managed Care – PPO | Admitting: Gynecology

## 2012-02-17 VITALS — BP 120/74 | Ht 65.0 in | Wt 164.0 lb

## 2012-02-17 DIAGNOSIS — Z01419 Encounter for gynecological examination (general) (routine) without abnormal findings: Secondary | ICD-10-CM | POA: Insufficient documentation

## 2012-02-17 DIAGNOSIS — R34 Anuria and oliguria: Secondary | ICD-10-CM

## 2012-02-17 DIAGNOSIS — Z780 Asymptomatic menopausal state: Secondary | ICD-10-CM

## 2012-02-17 DIAGNOSIS — Z1151 Encounter for screening for human papillomavirus (HPV): Secondary | ICD-10-CM | POA: Insufficient documentation

## 2012-02-17 LAB — URINALYSIS W MICROSCOPIC + REFLEX CULTURE
Bilirubin Urine: NEGATIVE
Glucose, UA: NEGATIVE mg/dL
Hgb urine dipstick: NEGATIVE
Ketones, ur: NEGATIVE mg/dL
Protein, ur: NEGATIVE mg/dL
Urobilinogen, UA: 0.2 mg/dL (ref 0.0–1.0)

## 2012-02-17 NOTE — Progress Notes (Signed)
Jillian Chandler 11-Jul-1951 [id number]   History:    61 y.o.  for annual gyn exam with past history of left mastectomy and sentinel node sampling in September 2010 for ductal carcinoma in situ with less than 1 mm area of microinvasion, ample margins. Isolated tumor cells were described in 3 of the sentinel nodes, NO. High grade ductal carcinoma in situ with whose tumor was 100% estrogen receptor positive and 19% progesterone positive receptor. Patient could not tolerate her aromatase inhibitors and is currently on no treatment. She is being followed by Dr. Luisa Hart general surgeon and Dr.Magrinat medical oncologist who she'll be seeing in the next few months. She had an upper endoscopy recently and is being treated for gastric ulcer and also had a benign polyp  removed at time of colonoscopy early this year. Her last bone density study was in 2010 was normal. She had been complaining of some frequency and urination but no dysuria no fever chills nausea or vomiting or vaginal discharge.  Past medical history,surgical history, family history and social history were all reviewed and documented in the EPIC chart.  Gynecologic History No LMP recorded. Patient is postmenopausal. Contraception: none Last Pap: 2012. Results were: normal Last mammogram: 2012. Results were: normal  Obstetric History OB History    Grav Para Term Preterm Abortions TAB SAB Ect Mult Living   4 3 3  1  1   3      # Outc Date GA Lbr Len/2nd Wgt Sex Del Anes PTL Lv   1 TRM     M SVD  No Yes   2 TRM     F SVD  No Yes   3 TRM     M CS  No Yes   4 SAB                ROS: A ROS was performed and pertinent positives and negatives are included in the history.  GENERAL: No fevers or chills. HEENT: No change in vision, no earache, sore throat or sinus congestion. NECK: No pain or stiffness. CARDIOVASCULAR: No chest pain or pressure. No palpitations. PULMONARY: No shortness of breath, cough or wheeze. GASTROINTESTINAL: No abdominal pain,  nausea, vomiting or diarrhea, melena or bright red blood per rectum. GENITOURINARY: No urinary frequency, urgency, hesitancy or dysuria. MUSCULOSKELETAL: No joint or muscle pain, no back pain, no recent trauma. DERMATOLOGIC: No rash, no itching, no lesions. ENDOCRINE: No polyuria, polydipsia, no heat or cold intolerance. No recent change in weight. HEMATOLOGICAL: No anemia or easy bruising or bleeding. NEUROLOGIC: No headache, seizures, numbness, tingling or weakness. PSYCHIATRIC: No depression, no loss of interest in normal activity or change in sleep pattern.     Exam: chaperone present  BP 120/74  Ht 5\' 5"  (1.651 m)  Wt 164 lb (74.39 kg)  BMI 27.29 kg/m2  Body mass index is 27.29 kg/(m^2).  General appearance : Well developed well nourished female. No acute distress HEENT: Neck supple, trachea midline, no carotid bruits, no thyroidmegaly Lungs: Clear to auscultation, no rhonchi or wheezes, or rib retractions  Heart: Regular rate and rhythm, no murmurs or gallops Breast:Examined in sitting and supine position, evidence of prior left mastectomy, no palpable masses or tenderness,  no skin retraction, no nipple inversion, no nipple discharge, no skin discoloration, no axillary or supraclavicular lymphadenopathy right breast normal. Abdomen: no palpable masses or tenderness, no rebound or guarding Extremities: no edema or skin discoloration or tenderness  Pelvic:  Bartholin, Urethra, Skene Glands: Within normal limits  Vagina: No gross lesions or discharge  Cervix: No gross lesions or discharge  Uterus  anteverted, normal size, shape and consistency, non-tender and mobile  Adnexa  Without masses or tenderness  Anus and perineum  normal   Rectovaginal  normal sphincter tone without palpated masses or tenderness             Hemoccult not done colonoscopy this year     Assessment/Plan:  61 y.o. female for annual exam with past history of intraductal carcinoma in situ of the  left breast in 2010 and is doing well. She's been followed by the general surgeon and her medical oncologist who had been doing her lab work so no lab work will be drawn today. Her primary physician's Dr. Drue Novel. She was instructed to continue to do her monthly self breast examination. She was reminded of the importance of calcium vitamin D for osteoporosis prevention as well as regular exercise. She will schedule her bone density study here in the office in the next couple weeks. We'll wait for the results of the urine culture. I've given and the names of non-estrogen-containing products that she can apply vaginally for vaginal dryness.    Ok Edwards MD, 5:21 PM 02/17/2012

## 2012-02-17 NOTE — Patient Instructions (Addendum)
Mantenimiento de Engineer, maintenance (IT) las mujeres (Health Maintenance, Females) Un estilo de vida saludable y los cuidados preventivos pueden favorecer la salud y [place].   Haga exmenes regulares de la salud en general, dentales y de los ojos.   Consuma una dieta saludable. Los Sun Microsystems, frutas, granos enteros, productos lcteos descremados y protenas magras contienen los nutrientes que usted necesita sin necesidad de consumir muchas caloras. Disminuya el consumo de alimentos con alto contenido de grasas slidas, azcar y sal agregadas. Si es necesario, pdaleinformacin acerca de una dieta [place] a su mdico.   La actividad fsica regular es una de las cosas ms importantes que puede hacer por su salud. Los adultos deben hacer al menos 150 minutos de ejercicios de intensidad moderada (cualquier actividad que aumente la frecuencia cardaca y lo haga transpirar) cada semana. Adems, la Harley[employer]son de los adultos necesita ejercicios de fortalecimiento muscular 2  ms [employer] and [employer].    Mantenga un peso saludable. El ndice de masa corporal [facility]) es una herramienta que identifica posibles problemas con [place]. Proporciona una estimacin de la grasa corporal basndose en el peso y la altura. El mdico podr determinar su [facility] y podr ayudarlo a Personnel officer o Pharmacologist un peso saludable. Para los adultos de 20 aos o ms:   Un [facility] menor a 18,5 se considera bajo peso.   Un [facility] entre 18,5 y 24,9 es normal.   Un [facility] entre 25 y 29,9 es sobrepeso.   Un I[facility] entre 30 o ms es obesidad.   Mantenga un nivel normal de lpidos y colesterol en sangre practicando actividad fsica y minimizando la ingesta de grasas saturadas. Consuma una dieta balanceada e incluya variedad de frutas y vegetales. Los [employer] de lpidos y Oncologist en sangre deben Games developer a los 20 aos y repetirse cada 5 aos. Si los niveles de colesterol son altos, tiene ms de 50 aos o tiene riesgo elevado de sufrir enfermedades  cardacas, Pension scheme manager controlarse con ms frecuencia.Si tiene [employer] de lpidos y colesterol, debe recibir tratamiento con medicamentos, si la dieta y el ejercicio no son efectivos.   Si fuma, consulte con [place] [employer] de las opciones para dejar de [place]. Si no lo hace, no comience.   Si est embarazada no beba alcohol. Si est amamantando, beba alcohol con prudencia. Si elige beber alcohol, no se exceda de 1 medida por da. Se considera una medida a 12 onzas (355 ml) de cerveza, 5 onzas (148 ml) de vino, o 1,5 onzas (44 ml) de licor.   Evite el alcohol y el consumo de drogas. No comparta agujas. Pida ayuda si necesita asistencia o instrucciones con respecto a abandonar el consumo de alcohol, cigarrillos o drogas.   La hipertensin arterial causa enfermedades cardacas y [place] el riesgo de ictus. Debe controlar su presin arterial al menos cada 1 o 2 aos. La presin arterial elevada que persiste debe tratarse con medicamentos si la prdida de peso y el ejercicio no son efectivos.   Si tiene entre 55 y 52 aos, consulte a su mdico si debe tomar aspirina para prevenir enfermedades cardacas.   Los anlisis para la diabetes incluyen la toma de [place] de sangre para controlar el nivel de azcar en la sangre durante el [place]. Debe hacerlo cada 3 aos despus de los 45 aos si est dentro de su peso normal y sin factores de riesgo para la diabetes. Las pruebas deben comenzar a edades tempranas o llevarse a cabo con ms frecuencia  si tiene sobrepeso y al menos 1 factor de riesgo para la diabetes.   Las evaluaciones para Market researcher de mama son un mtodo preventivo fundamental para las mujeres. Debe practicar la "autoconciencia de las mamas". Esto significa que Product/process development scientist apariencia normal de sus mamas y [employer] siente y pudiendo incluir un autoexamen de Building control surveyor. Si detecta algn cambio, no importa cun pequeo sea, debe informarlo a su mdico. Las mujeres entre 20 y  40 aos deben hacer un examen clnico de las mamas como parte del examen regular de [employer], cada 1 a 3 aos. Despus de los 40 aos deben Sprint Nextel Corporation. Deben hacerse una mamografa radografa de mamas ) cada ao, comenzando a los 40 aos. Las mujeres con Brewing technologist de cncer de mama deben hablar con el mdico para hacer un estudio gentico. Las que tienen ms riesgo deben Geographical information systems officer resonancia magntica y [employer] todos [place].   Un test de Pap se realiza para diagnosticar cncer de cuello de tero. Las mujeres deben [employer] un test de Pap a partir de los [address]. [employer]negy 21 y los 29 aos debe repetirse [address]. Luego de los 30 aos, debe realizarse un test de Pap cada tres aos siempre que los 3 estudios anteriores sean normales. Si le han realizado una histerectoma por un problema que no era cncer u otra enfermedad que podra causar cncer, ya no necesitar un test de Pap. Si tiene entre 65 y 53 aos y ha tenido Scientist, research (physical sciences) de Pap normal en los ltimos 10 aos, ya no ser Music therapist. Si ha recibido un tratamiento para Management consultant cervical o para una enfermedad que podra causar cncer, Musician un test de Pap y controles durante al menos 20 aos de concluir el [place]. Si no se ha Patent attorney con regularidad, Writer a evaluarse los factores de riesgo (como el tener un nuevo compaero sexual) para Occupational psychologist a Printmaker. Algunas mujeres sufren problemas mdicos que aumentan la probabilidad de Research officer, political party cervical. En estos casos, el mdico podr indicar que se realice el test de Pap con ms frecuencia.   La prueba del virus del Geneticist, molecular (VPH) es un anlisis adicional que puede usarse para Engineer, site de cuello de tero. Esta prueba busca la presencia del virus que causa los cambios en el cuello. Las clulas que se recolectan durante el test de Pap pueden usarse para el VPH. La prueba para el VPH puede  usarse para Development worker, community a mujeres de ms de 30 aos y debe usarse en mujeres de cualquier edad Cisco del test de Pap no sean claros. Despus de los 30 aos, las mujeres deben hacerse el anlisis para el VPH con la misma frecuencia que el test de Pap.   El cncer colorectal puede detectarse y con frecuencia puede prevenirse. La mayor parte de los estudios de rutina comienzan a los 50 aos y [employer]orne 75 aos. Sin embargo, el mdico podr aconsejarle que lo haga antes, si tiene factores de riesgo para el cncer de colon. Una vez por ao, el profesional le dar un kit de prueba para Scientist, product/process development en la materia fecal. La utilizacin de un tubo con una pequea cmara en su extremo para examinar directamente el colon (sigmoidoscopa o colonoscopa), puede detectar formas temprana de cncer colorectal. Hable con su mdico si tiene 50 aos, cuando comience con los estudios de [place]. El examen directo del  colon debe repetirse cada 5 a 10 aos, hasta los 75 aos, excepto que se encuentren formas tempranas de plipos precancerosos o pequeos bultos.   Se recomienda realizar un anlisis de sangre para Engineer, manufacturing hepatitis C a todas las personas [address] 1943 y 1963, y a todo aquel que tenga un riesgo conocido de haber contrado esta enfermedad.   Practique el sexo seguro. Use condones y evite las prcticas sexuales riesgosas para disminuir el contagio de enfermedades de transmisin sexual. Las mujeres sexualmente activas de 25 aos o menos deben controlarse para descartar clamidia, que es una infeccin de transmisin sexual frecuente. Las Coca Cola que tengan mltiples compaeros tambin deben hacerse el anlisis para Engineer, manufacturing clamidia. Se recomienda realizar anlisis para detectar otras enfermedades de transmisin sexual si es sexualmente [place] y tiene riesgos.   La osteoporosis es una enfermedad en la que los huesos pierden los minerales y la fuerza por el avance de la edad. El  resultado pueden ser fracturas graves en los [place]. El riesgo de osteoporosis puede identificarse con Neomia Dear prueba de densidad sea. Las mujeres de ms de 65 aos y las que tengan riesgos de sufrir fracturas u osteoporosis deben pedir consejo a su mdico. Consulte a su mdico si debe tomar un suplemento de calcio o de vitamina D para reducir el riesgo de osteoporosis.   La menopausia se asocia a sntomas y riesgos fsicos. Se dispone de una terapia de reemplazo hormonal para disminuir los sntomas y [place]. Consulte a su mdico para saber si la terapia de reemplazo hormonal es conveniente para usted.   Use una pantalla solar con un factor SPF de 30 o mayor. Aplique pantalla de Pietro Cassis y repetida a lo largo del Futures trader. Pngase al resguardo del sol cuando la sombra sea ms pequea que usted. Protjase usando mangas y [employer] Data, un sombrero de ala ancha y gafas para el sol todo el ao, siempre que se encuentre en el exterior.   Informe a su mdico si aparecen nuevos lunares o los que tiene se modifican, especialmente en forma y color. Tambin notifique al mdico si un lunar es ms grande que el tamao de una goma de Paramedic.   Mantngase al da con las vacunas.  Document Released: 07/09/2011 [facility] Patient Information 2012 [place], [place].    Lubricantes vaginal: Rephresh Lorel Monaco

## 2012-02-18 ENCOUNTER — Encounter: Payer: BC Managed Care – PPO | Admitting: Gynecology

## 2012-03-01 ENCOUNTER — Telehealth: Payer: Self-pay | Admitting: Internal Medicine

## 2012-03-01 NOTE — Telephone Encounter (Signed)
In reference to the 01/26/12 Orthopaedic referral entered for this patient, she was referred to [facility] per her request.  Per Neila Gear, they have called patient several times, and have been unable to communicate with anyone due to no spanish speaking staff.  I had also called patient, and the only thing I could understand is that she sees Dr. Ethelene Hal, but there is no appointment per his office.  Please advise.

## 2012-03-03 NOTE — Telephone Encounter (Signed)
Left a message, asked for a call back, I will try to help her set an appointment if she is still interested in seen orthopedic surgery.

## 2012-09-19 ENCOUNTER — Encounter (INDEPENDENT_AMBULATORY_CARE_PROVIDER_SITE_OTHER): Payer: Self-pay | Admitting: Surgery

## 2012-09-19 ENCOUNTER — Ambulatory Visit (INDEPENDENT_AMBULATORY_CARE_PROVIDER_SITE_OTHER): Payer: BC Managed Care – PPO | Admitting: Surgery

## 2012-09-19 VITALS — BP 124/80 | HR 65 | Temp 98.4°F | Resp 18 | Ht 67.0 in | Wt 166.0 lb

## 2012-09-19 DIAGNOSIS — Z853 Personal history of malignant neoplasm of breast: Secondary | ICD-10-CM

## 2012-09-19 NOTE — Progress Notes (Signed)
NAME: [name]       DOB: [birth year]           DATE: 09/19/2012       MRN: [id number]   Jillian Chandler is a 62 y.o.Marland Kitchenfemale who presents for routine followup of her  left breast cancer stage 1 diagnosed in 2010 and treated with left mastectomy. She has no problems or concerns on either side.  PFSH: She has had no significant changes since the last visit here.  ROS: There have been no significant changes since the last visit here  EXAM: General: The patient is alert, oriented, generally healty appearing, NAD. Mood and affect are normal.  Breasts:  Left breast surgically absent.  Soreness around the scar no mass  Right breast normal  Lymphatics: She has no axillary or supraclavicular adenopathy on either side.  Extremities: Full ROM of the surgical side with no lymphedema noted.  Data Reviewed: Mammo for next month  Impression: Doing well, with no evidence of recurrent cancer or new cancer  Plan: Will continue to follow up on an annual basis here.

## 2012-09-19 NOTE — Patient Instructions (Signed)
Return 1 year. 

## 2012-10-03 ENCOUNTER — Ambulatory Visit (INDEPENDENT_AMBULATORY_CARE_PROVIDER_SITE_OTHER): Payer: BC Managed Care – PPO | Admitting: Internal Medicine

## 2012-10-03 ENCOUNTER — Other Ambulatory Visit: Payer: Self-pay | Admitting: Internal Medicine

## 2012-10-03 ENCOUNTER — Encounter: Payer: Self-pay | Admitting: *Deleted

## 2012-10-03 VITALS — BP 124/78 | HR 70 | Temp 98.4°F | Wt 166.0 lb

## 2012-10-03 DIAGNOSIS — E119 Type 2 diabetes mellitus without complications: Secondary | ICD-10-CM

## 2012-10-03 DIAGNOSIS — R51 Headache: Secondary | ICD-10-CM

## 2012-10-03 DIAGNOSIS — I10 Essential (primary) hypertension: Secondary | ICD-10-CM

## 2012-10-03 DIAGNOSIS — R519 Headache, unspecified: Secondary | ICD-10-CM | POA: Insufficient documentation

## 2012-10-03 LAB — CBC WITH DIFFERENTIAL/PLATELET
Basophils Relative: 0.7 % (ref 0.0–3.0)
Eosinophils Absolute: 0.1 10*3/uL (ref 0.0–0.7)
Lymphocytes Relative: 41.8 % (ref 12.0–46.0)
MCHC: 34.4 g/dL (ref 30.0–36.0)
Monocytes Relative: 7.2 % (ref 3.0–12.0)
Neutrophils Relative %: 48.6 % (ref 43.0–77.0)
RBC: 5.14 Mil/uL — ABNORMAL HIGH (ref 3.87–5.11)
WBC: 6.8 10*3/uL (ref 4.5–10.5)

## 2012-10-03 LAB — COMPREHENSIVE METABOLIC PANEL
AST: 16 U/L (ref 0–37)
Albumin: 3.8 g/dL (ref 3.5–5.2)
BUN: 17 mg/dL (ref 6–23)
CO2: 30 mEq/L (ref 19–32)
Calcium: 9.7 mg/dL (ref 8.4–10.5)
Chloride: 101 mEq/L (ref 96–112)
Creatinine, Ser: 0.5 mg/dL (ref 0.4–1.2)
GFR: 151.14 mL/min (ref >60.00)
Glucose, Bld: 90 mg/dL (ref 70–99)
Potassium: 3.8 mEq/L (ref 3.5–5.1)

## 2012-10-03 LAB — SEDIMENTATION RATE: Sed Rate: 19 mm/hr (ref 0–22)

## 2012-10-03 MED ORDER — CYCLOBENZAPRINE HCL 10 MG PO TABS: 10.0000 mg | ORAL_TABLET | Freq: Two times a day (BID) | ORAL | Status: DC | PRN

## 2012-10-03 NOTE — Progress Notes (Signed)
  Subjective:    Patient ID: Jillian Chandler, female    DOB: May 14, 1951, 62 y.o.   MRN: [id number]  HPI ROV --her main certain today is headaches, symptoms are going on for 5 months, daily, mostly at the temples, posterior neck and even proximal shoulders bilaterally. Symptoms have been day or night. Sometimes awakened by pain. Currently taking Tylenol only. --Diabetes, not breath through blood sugars, on diet control only. --pertension, good medication compliance, reports no side effects. Was seen with shoulder pain, status post orthopedic  , doing better but not 100%.   Past Medical History:   DM, dx 06-2011 a1c 6.7 Breast cancer, Dx June 2010--status post mastectomy, was intolerant Arimidex   Hypertension-- Dx 2006   Gastri ulcer-Gastritis, CLO (-) per EGD ~ 11-2011 H/o diverticulitis ~ 07-2011  Past Surgical History:   Mastectomy - left 04-2009   Family History:   MI-- F MI age 90   CAD-- M age of onset 64 (?)   Stroke--no   DM-- brother, sister   Colon ca--no   Breast ca-- no   Social History:   Occupation: Financial controller  Married , children x 4   from [place]   Tobacco-- quit 2005,< <1 ppd   ETOH-- no    Review of Systems Denies fever chills or weight loss. No  nausea, vomiting  . No photophobia Denies any upper extremity paresthesias . Denies myalgias per se but admits to bilateral knee and wrist pain. Denies anxiety or depression, sleeps okay    Objective:   Physical Exam General -- alert, well-developed, NAD .   Neck --no thyromegaly , slightly tender to palpation at the cervical spine and at the proximal thoracic spine. Range of motion slightly limited by pain, unable to fully extend the neck.  Lungs -- normal respiratory effort, no intercostal retractions, no accessory muscle use, and normal breath sounds.   Heart-- normal rate, regular rhythm, no murmur, and no gallop.   Extremities-- no pretibial edema bilaterally; inspection on palpation of knee  and wrists without synovitis. Neurologic-- alert & oriented X3 , EOMI-PERRLA, speech gait and motor are intact. DTRs symmetric.Marland Kitchen Psych-- Cognition and judgment appear intact. Alert and cooperative with normal attention span and concentration.   anxious appearing but no depressed appearing.       Assessment & Plan:

## 2012-10-03 NOTE — Patient Instructions (Addendum)
Will order a brain MRI. Take Flexeril as needed for headaches, will cause drowsiness. Please come back in one month, we need to see about your headache. ---- vamos a tomarle un MRI de la cabeza [place] FLEXERIL 3 veces al dia si le dule la cabeza, le puede causar suen~0 regrese en un mes , tenemos que ver como va el dolor de Training and development officer

## 2012-10-03 NOTE — Assessment & Plan Note (Signed)
No ambulatory blood sugars, check the A1c

## 2012-10-03 NOTE — Assessment & Plan Note (Addendum)
5 months history of daily, sometimes intense . DDX tension headaches, cervicogenic headache, others. She has a  history of breast cancer Plan: Brain MRI. Flexeril Sedimentation rate Reassess in one month, encouraged to keep an appointment

## 2012-10-03 NOTE — Assessment & Plan Note (Signed)
No ambulatory BPs, good medication compliance, labs, no change

## 2012-10-04 ENCOUNTER — Encounter: Payer: Self-pay | Admitting: Internal Medicine

## 2012-10-04 NOTE — Telephone Encounter (Signed)
Refill done.  

## 2012-10-05 ENCOUNTER — Encounter: Payer: Self-pay | Admitting: *Deleted

## 2012-10-12 ENCOUNTER — Encounter: Payer: Self-pay | Admitting: Internal Medicine

## 2012-10-12 ENCOUNTER — Ambulatory Visit
Admission: RE | Admit: 2012-10-12 | Discharge: 2012-10-12 | Disposition: A | Discharge status: Home / Self Care | Payer: BC Managed Care – PPO | Source: Ambulatory Visit | Attending: Internal Medicine | Admitting: Internal Medicine

## 2012-10-12 DIAGNOSIS — R51 Headache: Secondary | ICD-10-CM

## 2012-10-12 MED ORDER — GADOBENATE DIMEGLUMINE 529 MG/ML IV SOLN
15.0000 mL | Freq: Once | INTRAVENOUS | Status: AC | PRN
  Administered 2012-10-12: 15 mL via INTRAVENOUS

## 2012-10-14 ENCOUNTER — Other Ambulatory Visit: Payer: BC Managed Care – PPO

## 2012-10-15 ENCOUNTER — Telehealth: Payer: Self-pay | Admitting: Oncology

## 2012-10-15 NOTE — Telephone Encounter (Signed)
4/16 appt moved to 4/23 due to GM PM [facility]. appt for 4/9 remains the same. S/w pt via [employer] [phone]) re changes w/new d/t for 4/23 and confirmed 4/9.

## 2012-10-18 ENCOUNTER — Encounter: Payer: Self-pay | Admitting: Gynecology

## 2012-10-18 ENCOUNTER — Other Ambulatory Visit: Payer: Self-pay | Admitting: Internal Medicine

## 2012-11-03 ENCOUNTER — Encounter: Payer: Self-pay | Admitting: Lab

## 2012-11-04 ENCOUNTER — Ambulatory Visit: Payer: BC Managed Care – PPO | Admitting: Internal Medicine

## 2012-11-08 ENCOUNTER — Ambulatory Visit (INDEPENDENT_AMBULATORY_CARE_PROVIDER_SITE_OTHER): Payer: BC Managed Care – PPO | Admitting: Internal Medicine

## 2012-11-08 VITALS — BP 120/82 | HR 75 | Temp 97.6°F | Wt 172.0 lb

## 2012-11-08 DIAGNOSIS — R51 Headache: Secondary | ICD-10-CM

## 2012-11-08 DIAGNOSIS — I10 Essential (primary) hypertension: Secondary | ICD-10-CM

## 2012-11-08 MED ORDER — METOPROLOL TARTRATE 50 MG PO TABS: 50.0000 mg | ORAL_TABLET | Freq: Every day | ORAL | Status: DC

## 2012-11-08 MED ORDER — HYDROCHLOROTHIAZIDE 25 MG PO TABS: 25.0000 mg | ORAL_TABLET | Freq: Every day | ORAL | Status: DC

## 2012-11-08 MED ORDER — LISINOPRIL 20 MG PO TABS: 20.0000 mg | ORAL_TABLET | Freq: Every day | ORAL | Status: DC

## 2012-11-08 MED ORDER — CYCLOBENZAPRINE HCL 10 MG PO TABS: 10.0000 mg | ORAL_TABLET | Freq: Two times a day (BID) | ORAL | Status: DC | PRN

## 2012-11-08 NOTE — Assessment & Plan Note (Addendum)
Since the last time she was here, she had significant improvement taking Flexeril. MRI was abnormal, see report. Plan: Refill Flexeril. See neurosurgery as planned given abnormal MRI of the brain. Addendum 11-09-12 Gaylyn Rong at Dr. Cassandria Santee office, has left several messages for patient to return her call & no response from patient. My scheduler  also left message for patient to call --> no response.  I just let a message, encouraged to call Dr Cassandria Santee office

## 2012-11-08 NOTE — Progress Notes (Signed)
  Subjective:    Patient ID: Jillian Chandler, female    DOB: [birth year], 62 y.o.   MRN: [id number]  HPI followup from previous visit. Headaches  , much improved with the use of Flexeril, denies any side effects. She also had aches and pains on and off, those symptoms are also improved with Flexeril. Hypertension,   she is taking less medication than prescribed yet her BP remains well-controlled.  Past Medical History  Diagnosis Date  . Hypertension 2006  . Diabetes mellitus 06/2011    Dr. Willow Ora  . Diverticulitis     Per patient   . Breast cancer 01/2009    left brest- status post mastectomy, intolerant to Arimidex, declined tamoxfien  . Gastric ulcer     CLO (-) per EGD ~ 11-2011   Past Surgical History  Procedure Laterality Date  . Mastectomy Left 04/2009  . Colonoscopy w/ polypectomy  11/2011      Review of Systems No concerns today, needs a refill on Flexeril.    Objective:   Physical Exam  General -- alert, well-developed, no apparent distress    Lungs -- normal respiratory effort, no intercostal retractions, no accessory muscle use, and normal breath sounds.   Heart-- normal rate, regular rhythm, no murmur, and no gallop.   Extremities-- no pretibial edema bilaterally  Psych-- Cognition and judgment appear intact. Alert and cooperative with normal attention span and concentration.  not anxious appearing and not depressed appearing.      Assessment & Plan:

## 2012-11-08 NOTE — Patient Instructions (Addendum)
Next visit in 3-4 months 

## 2012-11-08 NOTE — Assessment & Plan Note (Signed)
Well-controlled despite taking less medication than before. Lisinopril 40 mg taking only 20 mg. Metoprolol 50 mg 2 tablets daily, taking only one tablet daily. Plan: No change

## 2012-11-09 ENCOUNTER — Other Ambulatory Visit (HOSPITAL_BASED_OUTPATIENT_CLINIC_OR_DEPARTMENT_OTHER): Payer: BC Managed Care – PPO | Admitting: Lab

## 2012-11-09 ENCOUNTER — Encounter: Payer: Self-pay | Admitting: Internal Medicine

## 2012-11-09 DIAGNOSIS — D0590 Unspecified type of carcinoma in situ of unspecified breast: Secondary | ICD-10-CM

## 2012-11-09 DIAGNOSIS — Z853 Personal history of malignant neoplasm of breast: Secondary | ICD-10-CM

## 2012-11-09 LAB — CBC WITH DIFFERENTIAL/PLATELET
BASO%: 0.5 % (ref 0.0–2.0)
LYMPH%: 40.8 % (ref 14.0–49.7)
MCHC: 33.3 g/dL (ref 31.5–36.0)
MONO#: 0.7 10*3/uL (ref 0.1–0.9)
MONO%: 8.4 % (ref 0.0–14.0)
Platelets: 293 10*3/uL (ref 145–400)
RBC: 4.91 10*6/uL (ref 3.70–5.45)
RDW: 13.5 % (ref 11.2–14.5)
WBC: 8.3 10*3/uL (ref 3.9–10.3)

## 2012-11-09 LAB — COMPREHENSIVE METABOLIC PANEL (CC13)
ALT: 21 U/L (ref 0–55)
Alkaline Phosphatase: 84 U/L (ref 40–150)
CO2: 30 mEq/L — ABNORMAL HIGH (ref 22–29)
Potassium: 3.7 mEq/L (ref 3.5–5.1)
Sodium: 139 mEq/L (ref 136–145)
Total Bilirubin: 0.33 mg/dL (ref 0.20–1.20)
Total Protein: 7.2 g/dL (ref 6.4–8.3)

## 2012-11-11 ENCOUNTER — Ambulatory Visit: Payer: BC Managed Care – PPO | Admitting: Internal Medicine

## 2012-11-16 ENCOUNTER — Ambulatory Visit: Payer: BC Managed Care – PPO | Admitting: Oncology

## 2012-11-17 ENCOUNTER — Telehealth: Payer: Self-pay | Admitting: Internal Medicine

## 2012-11-17 NOTE — Telephone Encounter (Signed)
Patient's spouse states they need another Retail buyer. I was not able to understand why they were not able to schedule with [facility].

## 2012-11-17 NOTE — Telephone Encounter (Signed)
lmovm for pt to return call.  

## 2012-11-21 NOTE — Telephone Encounter (Signed)
Spoke to pt's husband & advised him he needs to call [facility] & ask to speak to [place]. Pt's husband understood.

## 2012-11-22 ENCOUNTER — Ambulatory Visit (INDEPENDENT_AMBULATORY_CARE_PROVIDER_SITE_OTHER): Payer: BC Managed Care – PPO | Admitting: Gynecology

## 2012-11-22 ENCOUNTER — Encounter: Payer: Self-pay | Admitting: Gynecology

## 2012-11-22 VITALS — BP 126/88

## 2012-11-22 DIAGNOSIS — N644 Mastodynia: Secondary | ICD-10-CM

## 2012-11-22 NOTE — Patient Instructions (Signed)
Breast Tenderness Breast tenderness is a common complaint made by women of all ages. It is also called mastalgia or mastodynia, which means breast pain. The condition can range from mild discomfort to severe pain. It has a variety of causes. Your caregiver will find out the likely cause of your breast tenderness by examining your breasts, asking you about symptoms and perhaps ordering some tests. Breast tenderness usually does not mean you have breast cancer. CAUSES  Breast tenderness has many possible causes. They include:  Premenstrual changes. A week to 10 days before your period, your breasts might ache or feel tender.  Other hormonal causes. These include:  When sexual and physical traits mature (puberty).  Pregnancy.  The time right before and the year after menopause (perimenopause).  The day when it has been 12 months since your last period (menopause).  Large breasts.  Infection (also called mastitis).  Birth control pills.  Breastfeeding. Tenderness can occur if the breasts are overfull with milk or if a milk duct is blocked.  Injury.  Fibrocystic breast changes. This is not cancer (benign). It causes painful breasts that feel lumpy.  Fluid-filled sacs (cysts). Often cysts can be drained in your healthcare provider's office.  Fibroadenoma. This is a tumor that is not cancerous.  Medication side effects. Blood pressure drugs and diuretics (which increase urine flow) sometimes cause breast tenderness.  Previous breast surgery, such as a breast reduction.  Breast cancer. Cancer is rarely the reason breasts are tender. In most women, tenderness is caused by something else. DIAGNOSIS  Several methods can be used to find out why your breasts are tender. They include:  Visual inspection of the breasts.  Examination by hand.  Tests, such as:  Mammogram.  Ultrasound.  Biopsy.  Lab test of any fluid coming from the nipple.  Blood tests.  MRI. TREATMENT    Treatment is directed to the cause of the breast tenderness from doing nothing for minor discomfort, wearing a good support bra but also may include:  Taking over-the-counter medicines for pain or discomfort as directed by your caregiver.  Prescription medicine for breast tenderness related to:  Premenstrual.  Fibrocystic.  Puberty.  Pregnancy.  Menopause.  Previous breast surgery.  Large breasts.  Antibiotics for infection.  Birth control pills for fibrocystic and premenstrual changes.  More frequent feedings or pumping of the breasts and warm compresses for breast engorgement when nursing.  Cold and warm compresses and a good support bra for most breast injuries.  Breast cysts are sometimes drained with a needle (aspiration) or removed with minor surgery.  Fibroadenomas are usually removed with minor surgery.  Changing or stopping the medicine when it is responsible for causing the breast tenderness.  When breast cancer is present with or without causing pain, it is usually treated with major surgery (with or without radiation) and chemotherapy. HOME CARE INSTRUCTIONS  Breast tenderness often can be handled at home. You can try:  Getting fitted for a new bra that provides more support, especially during exercise.  Wearing a more supportive or sports bra while sleeping when your breasts are very tender.  If you have a breast injury, using an ice pack for 15 to 20 minutes. Wrap the pack in a towel. Do not put the ice pack directly on your breast.  If your breasts are too full of milk as a result of breastfeeding, try:  Expressing milk either by hand or with a breast pump.  Applying a warm compress for relief.    Taking over-the-counter pain relievers, if this is OK with your caregiver.  Taking medicine that your caregiver prescribes. These might include antibiotics or birth control pills. Over the long term, your breast tenderness might be eased if you:  Cut  down on caffeine.  Reduce the amount of fat in your diet. Also, learn how to do breast examinations at home. This will help you tell when you have an unusual growth or lump that could cause tenderness. And keep a log of the days and times when your breasts are most tender. This will help you and your caregiver find the right solution. SEEK MEDICAL CARE IF:   Any part of your breast is hard, red and hot to the touch. This could be a sign of infection.  Fluid is coming out of your nipples (and you are not breastfeeding). Especially watch for blood or pus.  You have a fever as well as breast tenderness.  You have a new or painful lump in your breast that remains after your period ends.  You have tried to take care of the pain at home, but it has not gone away.  Your breast pain is getting worse. Or, the pain is making it hard to do the things you usually do during your day. Document Released: 07/02/2008 Document Revised: 10/12/2011 Document Reviewed: 07/02/2008 ExitCare Patient Information 2013 [employer].  

## 2012-11-22 NOTE — Progress Notes (Signed)
Patient presented to the office today complaining of right breast tenderness and sometimes on the left breast. Patient with past history of left mastectomy and sentinel node sampling in September 2010 for ductal carcinoma in situ with less than 1 mm area of microinvasion, ample margins. Isolated tumor cells were described in 3 of the sentinel nodes, NO. High grade ductal carcinoma in situ with whose tumor was 100% estrogen receptor positive and 19% progesterone positive receptor. Patient could not tolerate her aromatase inhibitors and is currently on no treatment. She is being followed by Dr. Luisa Hart general surgeon and Dr.Magrinat medical oncologist. She has just seen the general surgeon less than a week ago and will be seen the medical oncologist in the next few weeks.  Patient has informed me that she drinks 3 large cups of coffee daily. On her self breast examination she feels no masses.  Exam: Both breasts were examined sitting supine position left breast with evidence of prior left mastectomy. No palpable masses no supraclavicular axillary lymphadenopathy. Contralateral breast no discoloration no palpable masses or tenderness no supraclavicular axillary lymphadenopathy  Recent ultrasound:(10/17/2012) a small simple breast cyst noted at 3:00 position 2-3 finger breast from the nipple were noted. Recommendation was annual mammogram in one year.  Assessment/plan: Mastodynia. Reassuring mammogram last month. Normal exam. I have recommended patient begin taking vitamin D 600 units daily and that she cut down on her caffeine-containing products. She will followup with her medical oncologist the next few days. She needs to schedule her for gynecological examination for later in the year.

## 2012-11-23 ENCOUNTER — Encounter: Payer: Self-pay | Admitting: Gynecology

## 2012-11-23 ENCOUNTER — Ambulatory Visit (HOSPITAL_BASED_OUTPATIENT_CLINIC_OR_DEPARTMENT_OTHER): Payer: BC Managed Care – PPO | Admitting: Oncology

## 2012-11-23 ENCOUNTER — Telehealth: Payer: Self-pay | Admitting: *Deleted

## 2012-11-23 VITALS — BP 151/83 | HR 93 | Temp 98.4°F | Resp 20 | Ht 67.0 in | Wt 172.9 lb

## 2012-11-23 DIAGNOSIS — Z853 Personal history of malignant neoplasm of breast: Secondary | ICD-10-CM

## 2012-11-23 NOTE — Progress Notes (Signed)
ID: Louie Casa   DOB: 09/06/1950  MR#: [id number]  MVH#:[id number]  PCP: Willow Ora, MD  HISTORY OF PRESENT ILLNESS: The patient had not had mammography for approximately 10 years.  More recently, she felt her breasts were a little bit more full than usual and she was set up for mammography I believe at [facility] (the [facility]).  I do not have that report, but she was recalled, brought back for a diagnostic mammogram because of microcalcifications and core biopsy was obtained January 08, 2009 showing ([id number] and [id number]) an area of ductal carcinoma in situ with necrosis, high grade, ER positive at 100%, PR positive at 19%.    The patient was referred to Dr. Luisa Hart and bilateral breast MRIs were obtained June 23rd.  They showed the biopsy clip in the medial left breast and a 7 mm enhancing nodule near that.  There was a complex parenchymal pattern bilaterally, right greater than left, and there were several nodules in the right breast which were felt to be a little bit suspicious.  There was also a 1.2 cm enhancing area in the sternum with sternal metastases not excluded.  The patient had a right breast ultrasound showing no sonographic evidence of malignancy.  There was only a 6 mm normal appearing intramammary lymph node.  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    Her subsequent evaluation and treatment is as detailed below.  INTERVAL HISTORY: Aubryana returns today for followup of her noninvasive breast cancer. The interval history is unremarkable. She continues to work in [employer] at [employer]. She has been there 18 years.  REVIEW OF SYSTEMS: She has discomfort in the left axilla, which concerns her. She has other areas of the of joint pain particularly her knees. She has occasional hot flashes. Otherwise a detailed review of systems today was noncontributory    PAST MEDICAL HISTORY: Past Medical History  Diagnosis Date  . Hypertension 2006  . Diabetes mellitus 06/2011    Dr. Willow Ora  . Diverticulitis     Per patient   . Breast cancer 01/2009    left brest- status post mastectomy, intolerant to Arimidex, declined tamoxfien  . Gastric ulcer     CLO (-) per EGD ~ 11-2011  Significant for hypertension, osteoarthritis, history of migraines and status post C-section.    PAST SURGICAL HISTORY: Past Surgical History  Procedure Laterality Date  . Mastectomy Left 04/2009  . Colonoscopy w/ polypectomy  11/2011    FAMILY HISTORY Family History  Problem Relation Age of Onset  . Heart attack Father 67    F age 54, M age 38s  . Stroke Neg Hx   . Colon cancer Neg Hx   . Breast cancer Neg Hx   . Diabetes Sister   . Diabetes Brother   . Diabetes Maternal Aunt   . Diabetes Maternal Uncle   . Diabetes Paternal Aunt   . Diabetes Paternal Uncle   . Diabetes Maternal Grandmother   . Diabetes Maternal Grandfather   . Diabetes Paternal Grandmother   . Diabetes Paternal Grandfather   . Diabetes Sister   The patient's father died at the age of 70 from heart disease.  The patient's mother died at the age of 6 from myocardial infarction.  The patient also has a sister who died from a subarachnoid hemorrhage.  Another one died from complications of diabetes. She  has two surviving sisters and three brothers.  There is no history of breast or ovarian cancer in the family.    GYNECOLOGIC HISTORY: The patient carried 4 children to term; first child to term at age 73.  She has been having irregular periods, once every few months for several years now. She is having hot flashes as well.   She is not on any hormone replacement.    SOCIAL HISTORY: The patient works for [employer] in [employer].  Her husband  Elita Quick (same last name) works for [employer] in [employer]. The patient's daughter, Davoli, who is a Clinical research associate and passed the bar in [place],  has moved to this area.  She is studying for the bar here.  A son,lives in [place].  His name is Albia.  A son, Newt Lukes, is currently attending [employer] trying to get his GED.  Son Donnie Coffin attends [employer] and hopes to become a sports medicine specialist.  He is a Print production planner.  The patient was born in [place]. She belongs to an [employer].    ADVANCED DIRECTIVES:  HEALTH MAINTENANCE: History  Substance Use Topics  . Smoking status: Former Smoker    Quit date: 02/17/2007  . Smokeless tobacco: Never Used  . Alcohol Use: No     Colonoscopy: never  PAP: UTD  Bone density: Fernandez/nl  Lipid panel:  No Known Allergies  Current Outpatient Prescriptions  Medication Sig Dispense Refill  . acetaminophen (TYLENOL) 500 MG tablet Take 1,000 mg by mouth every 6 (six) hours as needed. For pain      . Cholecalciferol (VITAMIN D PO) Take 1 tablet by mouth daily.      . cyclobenzaprine (FLEXERIL) 10 MG tablet Take 1 tablet (10 mg total) by mouth 2 (two) times daily as needed (headache , neck pain).  60 tablet  1  . fish oil-omega-3 fatty acids 1000 MG capsule Take 2 g by mouth daily.      . hydrochlorothiazide (HYDRODIURIL) 25 MG tablet Take 1 tablet (25 mg total) by mouth daily.  90 tablet  1  . lisinopril (PRINIVIL,ZESTRIL) 20 MG tablet Take 1 tablet (20 mg total) by mouth daily.  90 tablet  1  . metoprolol (LOPRESSOR) 50 MG tablet Take 1 tablet (50 mg total) by mouth daily.  90 tablet  1   No current facility-administered medications for this visit.    OBJECTIVE: Middle-aged Latin American woman in no acute distress Filed Vitals:   11/23/12 1332  BP: 151/83  Pulse: 93  Temp: 98.4 F (36.9 C)  Resp: 20     Body mass index is 27.07 kg/(m^2).    ECOG FS: 0  Sclerae unicteric Oropharynx clear No peripheral adenopathy Lungs no rales or rhonchi Heart regular rate and rhythm Abd benign MSK no focal spinal tenderness, no peripheral edema; her area of concern in the left chest wall  and axilla is entirely benign, both by palpation and inspection Neuro: nonfocal, well oriented, pleasant affect Breasts: right breast, no suspicious findings; left breast status post mastectomy, no evidence of local recurrence; left axilla is benign  LAB RESULTS: Lab Results  Component Value Date   WBC 8.3 11/09/2012   NEUTROABS 4.0 11/09/2012   HGB 14.1 11/09/2012   HCT 42.4 11/09/2012   MCV 86.4 11/09/2012   PLT 293 11/09/2012      Chemistry      Component Value Date/Time   NA 139 11/09/2012 1510   NA 138 10/03/2012 1153   K  3.7 11/09/2012 1510   K 3.8 10/03/2012 1153   CL 101 11/09/2012 1510   CL 101 10/03/2012 1153   CO2 30* 11/09/2012 1510   CO2 30 10/03/2012 1153   BUN 15.9 11/09/2012 1510   BUN 17 10/03/2012 1153   CREATININE 0.7 11/09/2012 1510   CREATININE 0.5 10/03/2012 1153      Component Value Date/Time   CALCIUM 9.5 11/09/2012 1510   CALCIUM 9.7 10/03/2012 1153   ALKPHOS 84 11/09/2012 1510   ALKPHOS 69 10/03/2012 1153   AST 16 11/09/2012 1510   AST 16 10/03/2012 1153   ALT 21 11/09/2012 1510   ALT 27 10/03/2012 1153   BILITOT 0.33 11/09/2012 1510   BILITOT 0.6 10/03/2012 1153       No results found for this basename: LABCA2    No components found with this basename: LABCA125    No results found for this basename: INR,  in the last 168 hours  Urinalysis    Component Value Date/Time   COLORURINE YELLOW 02/17/2012 1641   APPEARANCEUR CLEAR 02/17/2012 1641   LABSPEC 1.010 02/17/2012 1641   PHURINE 6.0 02/17/2012 1641   GLUCOSEU NEG 02/17/2012 1641   HGBUR NEG 02/17/2012 1641   BILIRUBINUR NEG 02/17/2012 1641   BILIRUBINUR neg 11/25/2010   KETONESUR NEG 02/17/2012 1641   PROTEINUR NEG 02/17/2012 1641   UROBILINOGEN 0.2 02/17/2012 1641   UROBILINOGEN neg 11/25/2010   NITRITE NEG 02/17/2012 1641   NITRITE neg 11/25/2010   LEUKOCYTESUR NEG 02/17/2012 1641    STUDIES: Mammography 10/17/2012 was unremarkable   ASSESSMENT: 62 y.o.  Spanish speaker, status post left breast biopsy June of 2010 for high-grade  ductal carcinoma in situ, estrogen receptor 100% positive, progesterone receptor positive at 19%,  (1) status post left mastectomy and sentinel lymph node sampling September of 2010.  There was a less than 1 mm area of microinvasion along with the ductal carcinoma in situ, margins were ample, and 1/3 sentinel lymph nodes had some isolated tumor cells.   (2) She tried anastrozole briefly between October of 2010 and March of 2011 at which time it was discontinued because of myalgias/arthralgias.  She is now being followed with observation alone.   PLAN: Onetta is doing very well from a breast cancer point of view, and she will complete 5 years of followup when she sees me in a year. She will "graduate" at that point. She is a ready scheduled for a bone density later this month. She knows to call for any problems that may develop before the next visit.   MAGRINAT,GUSTAV C    11/23/2012

## 2012-11-23 NOTE — Telephone Encounter (Signed)
appts made and printed...td 

## 2012-11-24 ENCOUNTER — Other Ambulatory Visit: Payer: Self-pay | Admitting: Gynecology

## 2012-11-24 DIAGNOSIS — Z13820 Encounter for screening for osteoporosis: Secondary | ICD-10-CM

## 2012-12-06 ENCOUNTER — Encounter: Payer: Self-pay | Admitting: Gynecology

## 2013-02-06 ENCOUNTER — Telehealth: Payer: Self-pay | Admitting: Internal Medicine

## 2013-02-06 NOTE — Telephone Encounter (Signed)
Spoke to pharmacy, pt still has a refill let on file.

## 2013-02-06 NOTE — Telephone Encounter (Signed)
Pt came in today and requested a refill:hydrochlorothiazide (HYDRODIURIL) 25 MG tablet Pharmacy: [facility] [address]

## 2013-04-29 ENCOUNTER — Encounter (HOSPITAL_COMMUNITY): Payer: Self-pay | Admitting: *Deleted

## 2013-04-29 ENCOUNTER — Emergency Department (HOSPITAL_COMMUNITY)
Admission: EM | Admit: 2013-04-29 | Discharge: 2013-04-29 | Discharge status: Against Medical Advice | Payer: BC Managed Care – PPO | Attending: Emergency Medicine | Admitting: Emergency Medicine

## 2013-04-29 ENCOUNTER — Emergency Department (HOSPITAL_COMMUNITY): Payer: BC Managed Care – PPO

## 2013-04-29 DIAGNOSIS — Z8719 Personal history of other diseases of the digestive system: Secondary | ICD-10-CM | POA: Insufficient documentation

## 2013-04-29 DIAGNOSIS — Z79899 Other long term (current) drug therapy: Secondary | ICD-10-CM | POA: Insufficient documentation

## 2013-04-29 DIAGNOSIS — Z87891 Personal history of nicotine dependence: Secondary | ICD-10-CM | POA: Insufficient documentation

## 2013-04-29 DIAGNOSIS — Z853 Personal history of malignant neoplasm of breast: Secondary | ICD-10-CM | POA: Insufficient documentation

## 2013-04-29 DIAGNOSIS — R0602 Shortness of breath: Secondary | ICD-10-CM | POA: Insufficient documentation

## 2013-04-29 DIAGNOSIS — R072 Precordial pain: Secondary | ICD-10-CM | POA: Insufficient documentation

## 2013-04-29 DIAGNOSIS — I10 Essential (primary) hypertension: Secondary | ICD-10-CM | POA: Insufficient documentation

## 2013-04-29 DIAGNOSIS — E119 Type 2 diabetes mellitus without complications: Secondary | ICD-10-CM | POA: Insufficient documentation

## 2013-04-29 DIAGNOSIS — R079 Chest pain, unspecified: Secondary | ICD-10-CM

## 2013-04-29 LAB — CBC WITH DIFFERENTIAL/PLATELET
Basophils Absolute: 0 10*3/uL (ref 0.0–0.1)
Basophils Relative: 1 % (ref 0–1)
Eosinophils Absolute: 0.2 10*3/uL (ref 0.0–0.7)
Hemoglobin: 14.8 g/dL (ref 12.0–15.0)
Lymphocytes Relative: 39 % (ref 12–46)
MCH: 30 pg (ref 26.0–34.0)
MCHC: 35 g/dL (ref 30.0–36.0)
MCV: 85.6 fL (ref 78.0–100.0)
Monocytes Relative: 8 % (ref 3–12)
Neutrophils Relative %: 51 % (ref 43–77)
RBC: 4.94 MIL/uL (ref 3.87–5.11)

## 2013-04-29 LAB — COMPREHENSIVE METABOLIC PANEL
ALT: 19 U/L (ref 0–35)
AST: 15 U/L (ref 0–37)
Albumin: 3.7 g/dL (ref 3.5–5.2)
Alkaline Phosphatase: 75 U/L (ref 39–117)
BUN: 17 mg/dL (ref 6–23)
Calcium: 9.9 mg/dL (ref 8.4–10.5)
Creatinine, Ser: 0.52 mg/dL (ref 0.50–1.10)
Potassium: 4.1 mEq/L (ref 3.5–5.1)
Total Bilirubin: 0.3 mg/dL (ref 0.3–1.2)
Total Protein: 7.5 g/dL (ref 6.0–8.3)

## 2013-04-29 LAB — POCT I-STAT TROPONIN I: Troponin i, poc: 0 ng/mL (ref 0.00–0.08)

## 2013-04-29 LAB — D-DIMER, QUANTITATIVE: D-Dimer, Quant: <0.27 ug/mL-FEU (ref 0.00–0.48)

## 2013-04-29 MED ORDER — NITROGLYCERIN 0.4 MG SL SUBL
0.4000 mg | SUBLINGUAL_TABLET | Freq: Once | SUBLINGUAL | Status: AC
  Administered 2013-04-29: 0.4 mg via SUBLINGUAL

## 2013-04-29 MED ORDER — ASPIRIN 81 MG PO CHEW: 324.0000 mg | CHEWABLE_TABLET | Freq: Once | ORAL | Status: DC

## 2013-04-29 NOTE — ED Notes (Signed)
Pt with acute onset L chest pain and L arm numbness, starting around 11 am.  States when she breathes, she feels "weak".  Denies cardiac hx.  Pain increases with palpation.  Took 2 tylenol with no relief.

## 2013-04-29 NOTE — ED Provider Notes (Signed)
CSN: [id number]     Arrival date & time 04/29/13  1257 History   First MD Initiated Contact with Patient 04/29/13 1332     Chief Complaint  Patient presents with  . Chest Pain  . Shortness of Breath   (Consider location/radiation/quality/duration/timing/severity/associated sxs/prior Treatment) Patient is a 62 y.o. female presenting with chest pain and shortness of breath. The history is provided by the patient. The history is limited by a language barrier.  Chest Pain Pain location:  Substernal area Associated symptoms: shortness of breath   Associated symptoms: no abdominal pain, no back pain, no headache, no nausea, no numbness, not vomiting and no weakness   Shortness of Breath Associated symptoms: chest pain   Associated symptoms: no abdominal pain, no headaches, no rash and no vomiting    patient with substernal chest pain began at rest. Went to her left jaw and arm. Worse with palpation and worse with movement. Not worse with exertion. His been going on for a few hours today. No cough. No shortness of breath. No diaphoresis. No nausea. No numbness or weakness.  Past Medical History  Diagnosis Date  . Hypertension 2006  . Diabetes mellitus 06/2011    Dr. Willow Ora  . Diverticulitis     Per patient   . Breast cancer 01/2009    left brest- status post mastectomy, intolerant to Arimidex, declined tamoxfien  . Gastric ulcer     CLO (-) per EGD ~ 11-2011   Past Surgical History  Procedure Laterality Date  . Mastectomy Left 04/2009  . Colonoscopy w/ polypectomy  11/2011   Family History  Problem Relation Age of Onset  . Heart attack Father 73    F age 2, M age 93s  . Stroke Neg Hx   . Colon cancer Neg Hx   . Breast cancer Neg Hx   . Diabetes Sister   . Diabetes Brother   . Diabetes Maternal Aunt   . Diabetes Maternal Uncle   . Diabetes Paternal Aunt   . Diabetes Paternal Uncle   . Diabetes Maternal Grandmother   . Diabetes Maternal Grandfather   . Diabetes Paternal  Grandmother   . Diabetes Paternal Grandfather   . Diabetes Sister    History  Substance Use Topics  . Smoking status: Former Smoker    Quit date: 02/17/2007  . Smokeless tobacco: Never Used  . Alcohol Use: No   OB History   Grav Para Term Preterm Abortions TAB SAB Ect Mult Living   4 3 3  1  1   3      Review of Systems  Constitutional: Negative for activity change and appetite change.  HENT: Negative for neck stiffness.   Eyes: Negative for pain.  Respiratory: Positive for shortness of breath. Negative for chest tightness.   Cardiovascular: Positive for chest pain. Negative for leg swelling.  Gastrointestinal: Negative for nausea, vomiting, abdominal pain and diarrhea.  Genitourinary: Negative for flank pain.  Musculoskeletal: Negative for back pain.  Skin: Negative for rash.  Neurological: Negative for weakness, numbness and headaches.  Psychiatric/Behavioral: Negative for behavioral problems.    Allergies  Review of patient's allergies indicates no known allergies.  Home Medications   Current Outpatient Rx  Name  Route  Sig  Dispense  Refill  . acetaminophen (TYLENOL) 500 MG tablet   Oral   Take 1,000 mg by mouth every 6 (six) hours as needed. For pain         . Cholecalciferol (VITAMIN D PO)  Oral   Take 1 tablet by mouth daily.         . fish oil-omega-3 fatty acids 1000 MG capsule   Oral   Take 2 g by mouth daily.         . hydrochlorothiazide (HYDRODIURIL) 25 MG tablet   Oral   Take 1 tablet (25 mg total) by mouth daily.   90 tablet   1   . lisinopril (PRINIVIL,ZESTRIL) 20 MG tablet   Oral   Take 1 tablet (20 mg total) by mouth daily.   90 tablet   1   . metoprolol (LOPRESSOR) 50 MG tablet   Oral   Take 1 tablet (50 mg total) by mouth daily.   90 tablet   1    BP 141/71  Pulse 64  Temp(Src) 98.7 F (37.1 C) (Oral)  Resp 13  Ht 5\' 5"  (1.651 m)  SpO2 98% Physical Exam  Nursing note and vitals reviewed. Constitutional: She is  oriented to person, place, and time. She appears well-developed and well-nourished.  HENT:  Head: Normocephalic and atraumatic.  Eyes: EOM are normal. Pupils are equal, round, and reactive to light.  Neck: Normal range of motion. Neck supple.  Cardiovascular: Normal rate, regular rhythm and normal heart sounds.   No murmur heard. Pulmonary/Chest: Effort normal and breath sounds normal. No respiratory distress. She has no wheezes. She has no rales. She exhibits tenderness.  Moderate tenderness to upper chest just to left of sternum. No rash.  Abdominal: Soft. Bowel sounds are normal. She exhibits no distension. There is no tenderness. There is no rebound and no guarding.  Musculoskeletal: Normal range of motion.  Neurological: She is alert and oriented to person, place, and time. No cranial nerve deficit.  Skin: Skin is warm and dry.  Psychiatric: She has a normal mood and affect. Her speech is normal.    ED Course  Procedures (including critical care time) Labs Review Labs Reviewed  CBC WITH DIFFERENTIAL  COMPREHENSIVE METABOLIC PANEL  D-DIMER, QUANTITATIVE  POCT I-STAT TROPONIN I   Imaging Review Dg Chest 2 View  04/29/2013   CLINICAL DATA:  Chest pain and shortness of Breath  EXAM: CHEST  2 VIEW  COMPARISON:  For 1913  FINDINGS: The heart size and mediastinal contours are within normal limits. Both lungs are clear. The bony thorax is demineralized but intact.  IMPRESSION: No active cardiopulmonary disease.   Electronically Signed   By: Amie Portland   On: 04/29/2013 15:10    Date: 04/29/2013  Rate: 71  Rhythm: normal sinus rhythm  QRS Axis: normal  Intervals: normal  ST/T Wave abnormalities: normal  Conduction Disutrbances: none  Narrative Interpretation: unremarkable    MDM   1. Chest pain    Patient with pain to her left chest that radiated to left neck and arm. Tender to palpation. Pain was much improved with nitroglycerin. EKG reassuring and first enzymes negative.  Negative d-dimer. Patient was going to be admitted, but she changed her mind and wanted to be discharged home. She could not be convinced to change it back. She was discharged AMA and will follow with her PCP or cardiology. She was instructed that she can return and time.    Juliet Rude. Rubin Payor, MD 04/29/13 1718

## 2013-04-29 NOTE — ED Notes (Signed)
Patient made aware of risk leaving AMA, patient agreed to risk and still insists on leaving.  Patient stable on discharge.

## 2013-05-01 ENCOUNTER — Encounter: Payer: Self-pay | Admitting: Family Medicine

## 2013-05-01 ENCOUNTER — Ambulatory Visit (INDEPENDENT_AMBULATORY_CARE_PROVIDER_SITE_OTHER): Payer: BC Managed Care – PPO | Admitting: Family Medicine

## 2013-05-01 VITALS — BP 120/80 | HR 75 | Temp 98.6°F | Wt 171.8 lb

## 2013-05-01 DIAGNOSIS — Z8249 Family history of ischemic heart disease and other diseases of the circulatory system: Secondary | ICD-10-CM

## 2013-05-01 DIAGNOSIS — Z23 Encounter for immunization: Secondary | ICD-10-CM

## 2013-05-01 DIAGNOSIS — R079 Chest pain, unspecified: Secondary | ICD-10-CM

## 2013-05-01 MED ORDER — CYCLOBENZAPRINE HCL 10 MG PO TABS: 10.0000 mg | ORAL_TABLET | Freq: Three times a day (TID) | ORAL | Status: DC | PRN

## 2013-05-01 NOTE — Progress Notes (Signed)
  Subjective:    Jillian Chandler is a 62 y.o. female who presents for evaluation of chest pain. Onset was 2 days ago. Symptoms have improved since that time. The patient describes the pain as crushing, heaviness and radiates to the left arm. Patient rates pain as a 10/10 in intensity. Associated symptoms are: chest pain. Aggravating factors are: housework. Alleviating factors are: rest. Patient's cardiac risk factors are: diabetes mellitus, dyslipidemia, family history of premature cardiovascular disease, hypertension, obesity (BMI >= 30 kg/m2) and sedentary lifestyle. Patient's risk factors for DVT/PE: none. Previous cardiac testing: electrocardiogram (ECG) and cardiac labs in ER.  Pt states pain was intense after doing housework and radiated down L arm.  NTG helped pain in ER.  ER wanted to admit her but pt refused.  Translator is present as well.  The following portions of the patient's history were reviewed and updated as appropriate: allergies, current medications, past family history, past medical history, past social history, past surgical history and problem list.  Review of Systems Pertinent items are noted in HPI.    Objective:    BP 120/80  Pulse 75  Temp(Src) 98.6 F (37 C) (Oral)  Wt 171 lb 12.8 oz (77.928 kg)  BMI 28.59 kg/m2  SpO2 96% General appearance: alert, cooperative, appears stated age and no distress Neck: no adenopathy, no carotid bruit, no JVD, supple, symmetrical, trachea midline and thyroid not enlarged, symmetric, no tenderness/mass/nodules Lungs: clear to auscultation bilaterally Breasts: positive findings: L chest wall tenderness with palpation Heart: S1, S2 normal Abdomen: soft, non-tender; bowel sounds normal; no masses,  no organomegaly  Cardiographics ECG: normal sinus rhythm, no blocks or conduction defects, no ischemic changes--- done in ER  Imaging Chest x-ray: not indicated    Assessment:    Chest pain, suspected etiology: costochondritis     Plan:    Cardiology consultation. see ER visit  ER labs all neg--- but secondary to risk factor--+ fam hx and diabetes etc --refer to cards

## 2013-05-01 NOTE — Patient Instructions (Addendum)
Chest Pain (Nonspecific) °It is often hard to give a specific diagnosis for the cause of chest pain. There is always a chance that your pain could be related to something serious, such as a heart attack or a blood clot in the lungs. You need to follow up with your caregiver for further evaluation. °CAUSES  °· Heartburn. °· Pneumonia or bronchitis. °· Anxiety or stress. °· Inflammation around your heart (pericarditis) or lung (pleuritis or pleurisy). °· A blood clot in the lung. °· A collapsed lung (pneumothorax). It can develop suddenly on its own (spontaneous pneumothorax) or from injury (trauma) to the chest. °· Shingles infection (herpes zoster virus). °The chest wall is composed of bones, muscles, and cartilage. Any of these can be the source of the pain. °· The bones can be bruised by injury. °· The muscles or cartilage can be strained by coughing or overwork. °· The cartilage can be affected by inflammation and become sore (costochondritis). °DIAGNOSIS  °Lab tests or other studies, such as X-rays, electrocardiography, stress testing, or cardiac imaging, may be needed to find the cause of your pain.  °TREATMENT  °· Treatment depends on what may be causing your chest pain. Treatment may include: °· Acid blockers for heartburn. °· Anti-inflammatory medicine. °· Pain medicine for inflammatory conditions. °· Antibiotics if an infection is present. °· You may be advised to change lifestyle habits. This includes stopping smoking and avoiding alcohol, caffeine, and chocolate. °· You may be advised to keep your head raised (elevated) when sleeping. This reduces the chance of acid going backward from your stomach into your esophagus. °· Most of the time, nonspecific chest pain will improve within 2 to 3 days with rest and mild pain medicine. °HOME CARE INSTRUCTIONS  °· If antibiotics were prescribed, take your antibiotics as directed. Finish them even if you start to feel better. °· For the next few days, avoid physical  activities that bring on chest pain. Continue physical activities as directed. °· Do not smoke. °· Avoid drinking alcohol. °· Only take over-the-counter or prescription medicine for pain, discomfort, or fever as directed by your caregiver. °· Follow your caregiver's suggestions for further testing if your chest pain does not go away. °· Keep any follow-up appointments you made. If you do not go to an appointment, you could develop lasting (chronic) problems with pain. If there is any problem keeping an appointment, you must call to reschedule. °SEEK MEDICAL CARE IF:  °· You think you are having problems from the medicine you are taking. Read your medicine instructions carefully. °· Your chest pain does not go away, even after treatment. °· You develop a rash with blisters on your chest. °SEEK IMMEDIATE MEDICAL CARE IF:  °· You have increased chest pain or pain that spreads to your arm, neck, jaw, back, or abdomen. °· You develop shortness of breath, an increasing cough, or you are coughing up blood. °· You have severe back or abdominal pain, feel nauseous, or vomit. °· You develop severe weakness, fainting, or chills. °· You have a fever. °THIS IS AN EMERGENCY. Do not wait to see if the pain will go away. Get medical help at once. Call your local emergency services (911 in [place] Do not drive yourself to the hospital. °MAKE SURE YOU:  °· Understand these instructions. °· Will watch your condition. °· Will get help right away if you are not doing well or get worse. °Document Released: 04/29/2005 Document Revised: 10/12/2011 Document Reviewed: 02/23/2008 °ExitCare® Patient Information ©2014 ExitCare,   LLC. ° °

## 2013-06-13 ENCOUNTER — Encounter: Payer: Self-pay | Admitting: Cardiovascular Disease

## 2013-06-13 ENCOUNTER — Ambulatory Visit (INDEPENDENT_AMBULATORY_CARE_PROVIDER_SITE_OTHER): Payer: BC Managed Care – PPO | Admitting: Cardiovascular Disease

## 2013-06-13 VITALS — BP 140/80 | HR 79 | Ht 67.0 in | Wt 175.2 lb

## 2013-06-13 DIAGNOSIS — I10 Essential (primary) hypertension: Secondary | ICD-10-CM

## 2013-06-13 DIAGNOSIS — K279 Peptic ulcer, site unspecified, unspecified as acute or chronic, without hemorrhage or perforation: Secondary | ICD-10-CM

## 2013-06-13 DIAGNOSIS — R0789 Other chest pain: Secondary | ICD-10-CM

## 2013-06-13 DIAGNOSIS — R079 Chest pain, unspecified: Secondary | ICD-10-CM

## 2013-06-13 DIAGNOSIS — Z853 Personal history of malignant neoplasm of breast: Secondary | ICD-10-CM

## 2013-06-13 NOTE — Patient Instructions (Addendum)
Your physician recommends that you return for lab workYour physician recommends that you schedule a follow-up appointment in: 4 weeks.   Your physician has recommended you make the following change in your medication: take some naprosyn or aleve over the counter for the pain . Your physician has requested that you have an echocardiogram. Echocardiography is a painless test that uses sound waves to create images of your heart. It provides your doctor with information about the size and shape of your heart and how well your heart's chambers and valves are working. This procedure takes approximately one hour. There are no restrictions for this procedure.         Dolor de la pared torcica    (Chest Wall Pain)  El dolor en la pared torcica se siente en la zona de los huesos y msculos del trax. Podrn pasar hasta 6 semanas hasta que comience a mejorar. Podra pasar ms tiempo si es Probation officer. Puede aparecer sin motivo. Otras veces, algunos factores como grmenes, lesiones, tos o ejercicios pueden Armed forces operational officer. CUIDADOS EN EL HOGAR   Evite las actividades que lo cansen o le causen Engineer, mining. Trate de no usar los msculos del pecho, el vientre (abdomen) ni los msculos laterales. No levante objetos pesados.  Aplique hielo sobre la zona dolorida.  Ponga el hielo en una bolsa plstica.  Colquese una toalla entre la piel y la bolsa de hielo.  Deje el hielo durante 15 a 20 minutos durante los 2 primeros das.  Slo tome los medicamentos que le indique el mdico. SOLICITE AYUDA DE INMEDIATO SI:   Siente ms dolor o el dolor es muy molesto.  Tiene fiebre.  El dolor en el pecho [place].  Tiene nuevos sntomas.  Tiene malestar estomacal (nuseas) ovmitos.  Si se siente sudoroso o mareado.  Tiene tos con mucosidad (flema).  Tose y escupe sangre. ASEGRESE DE QUE:   Comprende estas instrucciones.  Controlar su enfermedad.  Solicitar ayuda de inmediato si no mejora o  si empeora. Document Released: 07/09/2011 Document Revised: 10/12/2011 ExitCare Patient Information 2014 ExitCare, [place]  Ecocardiograma (Echocardiography) El profesional que lo asiste le ha solicitado un Science writer. Esta es una prueba en la que se producen imgenes del corazn utilizando ondas sonoras. Estas imgenes ayudan a identificar anormalidades del corazn [employer] se incluyen: las del [address] cardaco, el tamao de las cmaras, el funcionamiento de las vlvulas, los lquidos que rodean el corazn y anormalidades en la movilidad. ANTES DEL PROCEDIMIENTO Regstrese en el departamento de admisiones para completar los formularios si fuera necesario. PROCEDIMIENTO  No se requiere una preparacin especial. Vstase y coma normalmente.  El procedimiento demorar aproximadamente 45 minutos.  Deber quitarse la ropa de la cintura para [place].  Se lubricar con gel un pequeo dispositivo de mano y se lo Scientific laboratory technician en varios puntos sobre el pecho. ste enviar ondas sonoras inocuas que usted no sentir, y stas a su vez sern transmitidas a una mquina que imprime estos patrones en un papel, para que el cardilogo (el mdico especialista en el corazn) pueda leerlo.  Esta prueba es simple, indolora, y proporciona una informacin muy valiosa al profesional que lo asiste. RESULTADOS Los resultados del ecocardiograma demorarn aproximadamente entre 1 y 2 [address]  [address]. Consulte al profesional que lo asiste el modo en que podr [employer]. Es su responsabilidad retirar el resultado del [place]. Document Released: 07/20/2005 Document Revised: 04/13/2012 [facility] Patient Information 2014 [place], [place].

## 2013-06-21 LAB — LIPID PANEL
Cholesterol: 209 mg/dL — ABNORMAL HIGH (ref 0–200)
Triglycerides: 89 mg/dL (ref <150)
VLDL: 18 mg/dL (ref 0–40)

## 2013-06-21 LAB — TSH: TSH: 1.127 u[IU]/mL (ref 0.350–4.500)

## 2013-07-04 ENCOUNTER — Ambulatory Visit (HOSPITAL_COMMUNITY): Payer: BC Managed Care – PPO

## 2013-07-06 ENCOUNTER — Encounter: Payer: Self-pay | Admitting: Cardiovascular Disease

## 2013-07-06 DIAGNOSIS — R079 Chest pain, unspecified: Secondary | ICD-10-CM | POA: Insufficient documentation

## 2013-07-06 NOTE — Progress Notes (Signed)
Patient ID: Jillian Chandler, female   DOB: [birth year], 62 y.o.   MRN: [id number]     PATIENT PROFILE: Jillian Chandler is a 62 year-old Hispanic female who is referred by Dr. Drue Novel for evaluation of left-sided chest discomfort.   HPI: Mr. Jillian Chandler denies any history of known cardiac disease. She is employed as a Building control surveyor. Recently, she had developed left-sided chest discomfort prompting an evaluation on 05/01/2013 by Jillian Chandler. At that time, the pain was described as crushing heaviness radiating to the left arm but seem to be aggravated by housework. The patient did have tenderness over the left chest wall and a presumptive diagnosis of costochondritis was made.  Subsequently, the patient has continued to experiment episodes of left-sided chest discomfort. This typically is nonexertional. It is not associated with shortness of breath. She does note soreness to pressure. She is continued to be active with her housekeeping and laundry responsibilities. She now presents for evaluation.  Past history is notable for hypertension for which she's been on pressure medications consisting of hydro-chlorothiazide 25 mg lisinopril 20 mg and metoprolol 50 mg. There also is a history of breast CA, and she underwent left mastectomy 4 years ago. She does not smoke cigarettes presently, but remotely had smoked a few cigarettes a day and quit over 8 years ago. There is no alcohol use. She does like to walk. She denies exertional precipitation of discomfort.  Past Medical History  Diagnosis Date  . Hypertension 2006  . Diabetes mellitus 06/2011    Dr. Willow Ora  . Diverticulitis     Per patient   . Breast cancer 01/2009    left brest- status post mastectomy, intolerant to Arimidex, declined tamoxfien  . Gastric ulcer     CLO (-) per EGD ~ 11-2011    Past Surgical History  Procedure Laterality Date  . Mastectomy Left 04/2009  . Colonoscopy w/ polypectomy  11/2011    No Known  Allergies  Current Outpatient Prescriptions  Medication Sig Dispense Refill  . acetaminophen (TYLENOL) 500 MG tablet Take 1,000 mg by mouth every 6 (six) hours as needed. For pain      . Cholecalciferol (VITAMIN D PO) Take 1 tablet by mouth daily.      . cyclobenzaprine (FLEXERIL) 10 MG tablet Take 10 mg by mouth as needed for muscle spasms.      . fish oil-omega-3 fatty acids 1000 MG capsule Take 2 g by mouth daily.      . hydrochlorothiazide (HYDRODIURIL) 25 MG tablet Take 1 tablet (25 mg total) by mouth daily.  90 tablet  1  . lisinopril (PRINIVIL,ZESTRIL) 20 MG tablet Take 1 tablet (20 mg total) by mouth daily.  90 tablet  1  . metoprolol (LOPRESSOR) 50 MG tablet Take 1 tablet (50 mg total) by mouth daily.  90 tablet  1   No current facility-administered medications for this visit.    Socially she is married for 36 years. She has 4 children 5 grandchildren. She speaks Spanish as her primary language.  Family History  Problem Relation Age of Onset  . Heart attack Father 73    F age 60, M age 71s  . Stroke Neg Hx   . Colon cancer Neg Hx   . Breast cancer Neg Hx   . Diabetes Sister   . Diabetes Brother   . Diabetes Maternal Aunt   . Diabetes Maternal Uncle   . Diabetes Paternal Aunt   . Diabetes Paternal Uncle   .  Diabetes Maternal Grandmother   . Diabetes Maternal Grandfather   . Diabetes Paternal Grandmother   . Diabetes Paternal Grandfather   . Diabetes Sister     ROS is negative for fever chills or night sweats. She denies rash. She denies change in vision. There is no hearing problems. There is a history of breast CA status post mastectomy. She is unaware of any lymphadenopathy. She denies wheezing. She denies shortness of breath. She does admit to chest wall discomfort. She denies reflux. She denies blood in her stool or urine. She denies claudication symptoms. She is unaware of any diabetes. There is no cold or heat intolerance. She denies tremors. There are no seizure  history. She denies difficulty with sleep. Other comprehensive 14 point system review is negative.  PE BP 140/80  Pulse 79  Ht 5\' 7"  (1.702 m)  Wt 79.47 kg (175 lb 3.2 oz)  BMI 27.43 kg/m2 General: Alert, oriented, no distress.  Skin: normal turgor, no rashes HEENT: Normocephalic, atraumatic. Pupils round and reactive; sclera anicteric; Fundi without hemorrhages or exudates Nose without nasal septal hypertrophy Mouth/Parynx benign; Mallinpatti scale 2 Neck: No JVD, no carotid bruits Lungs: clear to ausculatation and percussion; no wheezing or rales Chest wall tenderness over the left costochondral region rib region. This mimicked her discomfort Heart: RRR, s1 s2 normal;  1/6 systolic murmur Abdomen: soft, nontender; no hepatosplenomehaly, BS+; abdominal aorta nontender and not dilated by palpation. Pulses 2+ Extremities: no clubbinbg cyanosis or edema, Homan's sign negative  Neurologic: grossly nonfocal Psychologic: Normal mood and affect    ECG: Sinus rhythm at 79 beats per minute. No ectopy. Normal intervals.  LABS:  BMET    Component Value Date/Time   NA 138 04/29/2013 1327   NA 139 11/09/2012 1510   K 4.1 04/29/2013 1327   K 3.7 11/09/2012 1510   CL 102 04/29/2013 1327   CL 101 11/09/2012 1510   CO2 30 04/29/2013 1327   CO2 30* 11/09/2012 1510   GLUCOSE 95 04/29/2013 1327   GLUCOSE 80 11/09/2012 1510   BUN 17 04/29/2013 1327   BUN 15.9 11/09/2012 1510   CREATININE 0.52 04/29/2013 1327   CREATININE 0.7 11/09/2012 1510   CALCIUM 9.9 04/29/2013 1327   CALCIUM 9.5 11/09/2012 1510   GFRNONAA >90 04/29/2013 1327   GFRAA >90 04/29/2013 1327     Hepatic Function Panel     Component Value Date/Time   PROT 7.5 04/29/2013 1327   PROT 7.2 11/09/2012 1510   ALBUMIN 3.7 04/29/2013 1327   ALBUMIN 3.6 11/09/2012 1510   AST 15 04/29/2013 1327   AST 16 11/09/2012 1510   ALT 19 04/29/2013 1327   ALT 21 11/09/2012 1510   ALKPHOS 75 04/29/2013 1327   ALKPHOS 84 11/09/2012 1510   BILITOT 0.3 04/29/2013 1327    BILITOT 0.33 11/09/2012 1510   BILIDIR 0.1 07/17/2011 0038   IBILI 0.5 07/17/2011 0038     CBC    Component Value Date/Time   WBC 8.5 04/29/2013 1327   WBC 8.3 11/09/2012 1510   RBC 4.94 04/29/2013 1327   RBC 4.91 11/09/2012 1510   HGB 14.8 04/29/2013 1327   HGB 14.1 11/09/2012 1510   HCT 42.3 04/29/2013 1327   HCT 42.4 11/09/2012 1510   PLT 335 04/29/2013 1327   PLT 293 11/09/2012 1510   MCV 85.6 04/29/2013 1327   MCV 86.4 11/09/2012 1510   MCH 30.0 04/29/2013 1327   MCH 28.7 11/09/2012 1510   MCHC 35.0 04/29/2013 1327  MCHC 33.3 11/09/2012 1510   RDW 13.7 04/29/2013 1327   RDW 13.5 11/09/2012 1510   LYMPHSABS 3.3 04/29/2013 1327   LYMPHSABS 3.4* 11/09/2012 1510   MONOABS 0.7 04/29/2013 1327   MONOABS 0.7 11/09/2012 1510   EOSABS 0.2 04/29/2013 1327   EOSABS 0.2 11/09/2012 1510   BASOSABS 0.0 04/29/2013 1327   BASOSABS 0.0 11/09/2012 1510     BNP No results found for this basename: probnp    Lipid Panel     Component Value Date/Time   CHOL 209* 06/21/2013 0843   TRIG 89 06/21/2013 0843   HDL 52 06/21/2013 0843   CHOLHDL 4.0 06/21/2013 0843   VLDL 18 06/21/2013 0843   LDLCALC 139* 06/21/2013 0843     RADIOLOGY: No results found.   ASSESSMENT AND PLAN: I had a long discussion with Jillian Chandler who is here today with her daughter as well as an interpreter. I did speak part of the time with her in [place]. Her ECG is normal. She is on 3 medications for blood pressure control. I suspect her chest discomfort is musculoskeletal in etiology rather than ischemia mediated. Her pain has improved from her initial presentation in late September but is still present. We did discuss potential nonsteroidal anti-inflammatory medication for this. I am scheduling her for a 2-D echo Doppler study to evaluate systolic and diastolic function. I have reviewed her previous laboratory but will  also check lipid studies and TSH. I will see her back in the office for followup evaluation.   Lennette Bihari, MD,  [facility] 07/06/2013 2:54 PM

## 2013-07-10 ENCOUNTER — Encounter: Payer: Self-pay | Admitting: Cardiovascular Disease

## 2013-07-10 ENCOUNTER — Ambulatory Visit: Payer: BC Managed Care – PPO | Admitting: Cardiovascular Disease

## 2013-07-18 ENCOUNTER — Ambulatory Visit (HOSPITAL_COMMUNITY)
Admission: RE | Admit: 2013-07-18 | Discharge: 2013-07-18 | Disposition: A | Discharge status: Home / Self Care | Payer: BC Managed Care – PPO | Source: Ambulatory Visit | Attending: Cardiovascular Disease | Admitting: Cardiovascular Disease

## 2013-07-18 DIAGNOSIS — I10 Essential (primary) hypertension: Secondary | ICD-10-CM

## 2013-07-18 DIAGNOSIS — R079 Chest pain, unspecified: Secondary | ICD-10-CM | POA: Insufficient documentation

## 2013-07-18 DIAGNOSIS — E119 Type 2 diabetes mellitus without complications: Secondary | ICD-10-CM | POA: Insufficient documentation

## 2013-07-18 NOTE — Progress Notes (Signed)
2D Echo Performed 07/18/2013    Olie Dibert, RCS  

## 2013-07-23 ENCOUNTER — Encounter: Payer: Self-pay | Admitting: *Deleted

## 2013-07-26 ENCOUNTER — Encounter: Payer: Self-pay | Admitting: Cardiovascular Disease

## 2013-07-26 ENCOUNTER — Ambulatory Visit (INDEPENDENT_AMBULATORY_CARE_PROVIDER_SITE_OTHER): Payer: BC Managed Care – PPO | Admitting: Cardiovascular Disease

## 2013-07-26 VITALS — BP 120/68 | HR 76 | Resp 16 | Ht 65.0 in | Wt 173.9 lb

## 2013-07-26 DIAGNOSIS — E785 Hyperlipidemia, unspecified: Secondary | ICD-10-CM

## 2013-07-26 DIAGNOSIS — E782 Mixed hyperlipidemia: Secondary | ICD-10-CM

## 2013-07-26 DIAGNOSIS — I10 Essential (primary) hypertension: Secondary | ICD-10-CM

## 2013-07-26 DIAGNOSIS — Z79899 Other long term (current) drug therapy: Secondary | ICD-10-CM

## 2013-07-26 MED ORDER — PRAVASTATIN SODIUM 40 MG PO TABS: 40.0000 mg | ORAL_TABLET | Freq: Every evening | ORAL | Status: DC

## 2013-07-26 NOTE — Progress Notes (Signed)
Patient ID: Jillian Chandler, female   DOB: [birth year], 62 y.o.   MRN: [id number]      Reason for office visit Followup hypertension and hyperlipidemia  Jillian Chandler usually sees Dr. Daphene Jaeger. A recent echocardiogram showed normal left ventricular systolic function, as well as absence of LVH and normal diastolic function. Her lipid profile shows elevated cholesterol levels, considerably worse than they were 2 years ago. She has gained weight and exercises a lot less than she did then. She continues have some chest discomfort but this is clearly musculoskeletal associated with mopping and not with other types of physical exercise.   No Known Allergies  Current Outpatient Prescriptions  Medication Sig Dispense Refill  . acetaminophen (TYLENOL) 500 MG tablet Take 1,000 mg by mouth every 6 (six) hours as needed. For pain      . aspirin 81 MG tablet Take 81 mg by mouth daily.      . butalbital-acetaminophen-caffeine (FIORICET WITH CODEINE) 50-325-40-30 MG per capsule Take 1 capsule by mouth every 4 (four) hours as needed for headache.      . Cholecalciferol (VITAMIN D PO) Take 1 tablet by mouth daily.      . cyclobenzaprine (FLEXERIL) 10 MG tablet Take 10 mg by mouth as needed for muscle spasms.      . fish oil-omega-3 fatty acids 1000 MG capsule Take 2 g by mouth daily.      . hydrochlorothiazide (HYDRODIURIL) 25 MG tablet Take 1 tablet (25 mg total) by mouth daily.  90 tablet  1  . levocetirizine (XYZAL) 5 MG tablet Take 5 mg by mouth every evening.      Marland Kitchen lisinopril (PRINIVIL,ZESTRIL) 20 MG tablet Take 1 tablet (20 mg total) by mouth daily.  90 tablet  1  . metoprolol (LOPRESSOR) 50 MG tablet Take 150 mg by mouth daily.      . Multiple Vitamin (MULTIVITAMIN) capsule Take 1 capsule by mouth daily.      . pravastatin (PRAVACHOL) 40 MG tablet Take 1 tablet (40 mg total) by mouth every evening.  90 tablet  3   No current facility-administered medications for this visit.    Past Medical  History  Diagnosis Date  . Hypertension 2006  . Diabetes mellitus 06/2011    Dr. Willow Ora  . Diverticulitis     Per patient   . Breast cancer 01/2009    left brest- status post mastectomy, intolerant to Arimidex, declined tamoxfien  . Gastric ulcer     CLO (-) per EGD ~ 11-2011    Past Surgical History  Procedure Laterality Date  . Mastectomy Left 04/2009  . Colonoscopy w/ polypectomy  11/2011    Family History  Problem Relation Age of Onset  . Heart attack Father 38    F age 5, M age 34s  . Stroke Neg Hx   . Colon cancer Neg Hx   . Breast cancer Neg Hx   . Diabetes Sister   . Diabetes Brother   . Diabetes Maternal Aunt   . Diabetes Maternal Uncle   . Diabetes Paternal Aunt   . Diabetes Paternal Uncle   . Diabetes Maternal Grandmother   . Diabetes Maternal Grandfather   . Diabetes Paternal Grandmother   . Diabetes Paternal Grandfather   . Diabetes Sister     History   Social History  . Marital Status: Married    Spouse Name: N/A    Number of Children: 4  . Years of Education: N/A   Occupational History  .  [employer]ologist, prison and probation services   Social History Main Topics  . Smoking status: Former Smoker    Quit date: 02/17/2007  . Smokeless tobacco: Never Used  . Alcohol Use: No  . Drug Use: No  . Sexual Activity: Not on file   Other Topics Concern  . Not on file   Social History Narrative   Original from [place] ---   Diet: very  healthy   Exercise- not frequently but active at work                Review of systems: The patient specifically denies any chest pain at rest or with exertion, dyspnea at rest or with exertion, orthopnea, paroxysmal nocturnal dyspnea, syncope, palpitations, focal neurological deficits, intermittent claudication, lower extremity edema, unexplained weight gain, cough, hemoptysis or wheezing.  The patient also denies abdominal pain, nausea, vomiting, dysphagia, diarrhea, constipation, polyuria, polydipsia, dysuria,  hematuria, frequency, urgency, abnormal bleeding or bruising, fever, chills, unexpected weight changes, mood swings, change in skin or hair texture, change in voice quality, auditory or visual problems, allergic reactions or rashes, new musculoskeletal complaints other than usual "aches and pains".   PHYSICAL EXAM BP 120/68  Pulse 76  Resp 16  Ht 5\' 5"  (1.651 m)  Wt 173 lb 14.4 oz (78.881 kg)  BMI 28.94 kg/m2  General: Alert, oriented x3, no distress Head: no evidence of trauma, PERRL, EOMI, no exophtalmos or lid lag, no myxedema, no xanthelasma; normal ears, nose and oropharynx Neck: normal jugular venous pulsations and no hepatojugular reflux; brisk carotid pulses without delay and no carotid bruits Chest: clear to auscultation, no signs of consolidation by percussion or palpation, normal fremitus, symmetrical and full respiratory excursions Cardiovascular: normal position and quality of the apical impulse, regular rhythm, normal first and second heart sounds, no murmurs, rubs or gallops Abdomen: no tenderness or distention, no masses by palpation, no abnormal pulsatility or arterial bruits, normal bowel sounds, no hepatosplenomegaly Extremities: no clubbing, cyanosis or edema; 2+ radial, ulnar and brachial pulses bilaterally; 2+ right femoral, posterior tibial and dorsalis pedis pulses; 2+ left femoral, posterior tibial and dorsalis pedis pulses; no subclavian or femoral bruits Neurological: grossly nonfocal   Lipid Panel     Component Value Date/Time   CHOL 209* 06/21/2013 0843   TRIG 89 06/21/2013 0843   HDL 52 06/21/2013 0843   CHOLHDL 4.0 06/21/2013 0843   VLDL 18 06/21/2013 0843   LDLCALC 139* 06/21/2013 0843    BMET    Component Value Date/Time   NA 138 04/29/2013 1327   NA 139 11/09/2012 1510   K 4.1 04/29/2013 1327   K 3.7 11/09/2012 1510   CL 102 04/29/2013 1327   CL 101 11/09/2012 1510   CO2 30 04/29/2013 1327   CO2 30* 11/09/2012 1510   GLUCOSE 95 04/29/2013 1327    GLUCOSE 80 11/09/2012 1510   BUN 17 04/29/2013 1327   BUN 15.9 11/09/2012 1510   CREATININE 0.52 04/29/2013 1327   CREATININE 0.7 11/09/2012 1510   CALCIUM 9.9 04/29/2013 1327   CALCIUM 9.5 11/09/2012 1510   GFRNONAA >90 04/29/2013 1327   GFRAA >90 04/29/2013 1327     ASSESSMENT AND PLAN HYPERTENSION Control is good  Hyperlipidemia Target LDL cholesterol is less than 100 mg/dL. Her total and LDL cholesterol are substantially worse and there were 2 years ago. This appears to be related to a reduction in exercise level. I strongly encourage her to go back to exercise. She may try swimming  or biking as alternatives to walking if her hip hurts too much. We also discussed the importance of avoiding saturated fat and excessive starches with high glycemic index. I gave her a prescription for pravastatin 40 mg at bedtime daily and she will repeat her laboratory tests in 3 months. Followup with Dr. Tresa Endo   Orders Placed This Encounter  Procedures  . Lipid Profile  . Comp Met (CMET)   Meds ordered this encounter  Medications  . metoprolol (LOPRESSOR) 50 MG tablet    Sig: Take 150 mg by mouth daily.  Marland Kitchen aspirin 81 MG tablet    Sig: Take 81 mg by mouth daily.  . butalbital-acetaminophen-caffeine (FIORICET WITH CODEINE) 50-325-40-30 MG per capsule    Sig: Take 1 capsule by mouth every 4 (four) hours as needed for headache.  . levocetirizine (XYZAL) 5 MG tablet    Sig: Take 5 mg by mouth every evening.  . Multiple Vitamin (MULTIVITAMIN) capsule    Sig: Take 1 capsule by mouth daily.  . pravastatin (PRAVACHOL) 40 MG tablet    Sig: Take 1 tablet (40 mg total) by mouth every evening.    Dispense:  90 tablet    Refill:  3    Eion Timbrook  Thurmon Fair, MD, [facility] [phone] office [phone] pager

## 2013-07-26 NOTE — Assessment & Plan Note (Addendum)
Target LDL cholesterol is less than 100 mg/dL. Her total and LDL cholesterol are substantially worse and there were 2 years ago. This appears to be related to a reduction in exercise level. I strongly encourage her to go back to exercise. She may try swimming or biking as alternatives to walking if her hip hurts too much. We also discussed the importance of avoiding saturated fat and excessive starches with high glycemic index. I gave her a prescription for pravastatin 40 mg at bedtime daily and she will repeat her laboratory tests in 3 months. Followup with Dr. Tresa Endo

## 2013-07-26 NOTE — Patient Instructions (Signed)
Your physician recommends that you schedule a follow-up appointment in: 6 months Dr Tresa Endo  Your physician recommends that you return for FASTING lab work in: 3 months  START PRAVASTATIN 40 MG AT [facility]  NIGHT

## 2013-07-26 NOTE — Assessment & Plan Note (Signed)
Control is good

## 2013-08-23 ENCOUNTER — Emergency Department (INDEPENDENT_AMBULATORY_CARE_PROVIDER_SITE_OTHER): Payer: BC Managed Care – PPO

## 2013-08-23 ENCOUNTER — Emergency Department (HOSPITAL_COMMUNITY)
Admission: EM | Admit: 2013-08-23 | Discharge: 2013-08-23 | Disposition: A | Discharge status: Home / Self Care | Payer: BC Managed Care – PPO | Source: Home / Self Care | Attending: Family Medicine | Admitting: Family Medicine

## 2013-08-23 ENCOUNTER — Encounter (HOSPITAL_COMMUNITY): Payer: Self-pay | Admitting: Emergency Medicine

## 2013-08-23 DIAGNOSIS — M67921 Unspecified disorder of synovium and tendon, right upper arm: Secondary | ICD-10-CM

## 2013-08-23 DIAGNOSIS — M719 Bursopathy, unspecified: Secondary | ICD-10-CM

## 2013-08-23 DIAGNOSIS — M6790 Unspecified disorder of synovium and tendon, unspecified site: Secondary | ICD-10-CM

## 2013-08-23 MED ORDER — HYDROCODONE-ACETAMINOPHEN 5-325 MG PO TABS: 1.0000 | ORAL_TABLET | Freq: Four times a day (QID) | ORAL | Status: DC | PRN

## 2013-08-23 NOTE — ED Provider Notes (Signed)
CSN: [id number]     Arrival date & time 08/23/13  1834 History   First MD Initiated Contact with Patient 08/23/13 1904     Chief Complaint  Patient presents with  . Shoulder Pain   (Consider location/radiation/quality/duration/timing/severity/associated sxs/prior Treatment) Patient is a 63 y.o. female presenting with shoulder pain. The history is provided by the patient and the spouse. The history is limited by a language barrier. No language interpreter was used (husband translated.).  Shoulder Pain This is a new problem. The current episode started 6 to 12 hours ago (lifting laundry basket this am, sudden right ant shoulder pain.). The problem has been gradually worsening. Pertinent negatives include no chest pain, no abdominal pain and no headaches.    Past Medical History  Diagnosis Date  . Hypertension 2006  . Diabetes mellitus 06/2011    Dr. Kathlene November  . Diverticulitis     Per patient   . Breast cancer 01/2009    left brest- status post mastectomy, intolerant to Arimidex, declined tamoxfien  . Gastric ulcer     CLO (-) per EGD ~ 11-2011   Past Surgical History  Procedure Laterality Date  . Mastectomy Left 04/2009  . Colonoscopy w/ polypectomy  11/2011   Family History  Problem Relation Age of Onset  . Heart attack Father 45    F age 53, M age 59s  . Stroke Neg Hx   . Colon cancer Neg Hx   . Breast cancer Neg Hx   . Diabetes Sister   . Diabetes Brother   . Diabetes Maternal Aunt   . Diabetes Maternal Uncle   . Diabetes Paternal Aunt   . Diabetes Paternal Uncle   . Diabetes Maternal Grandmother   . Diabetes Maternal Grandfather   . Diabetes Paternal Grandmother   . Diabetes Paternal Grandfather   . Diabetes Sister    History  Substance Use Topics  . Smoking status: Former Smoker    Quit date: 02/17/2007  . Smokeless tobacco: Never Used  . Alcohol Use: No   OB History   Grav Para Term Preterm Abortions TAB SAB Ect Mult Living   4 3 3  1  1   3      Review of  Systems  Constitutional: Negative.   Cardiovascular: Negative for chest pain.  Gastrointestinal: Negative for abdominal pain.  Musculoskeletal: Negative for joint swelling.  Neurological: Negative for headaches.    Allergies  Review of patient's allergies indicates no known allergies.  Home Medications   Current Outpatient Rx  Name  Route  Sig  Dispense  Refill  . acetaminophen (TYLENOL) 500 MG tablet   Oral   Take 1,000 mg by mouth every 6 (six) hours as needed. For pain         . aspirin 81 MG tablet   Oral   Take 81 mg by mouth daily.         . butalbital-acetaminophen-caffeine (FIORICET WITH CODEINE) 50-325-40-30 MG per capsule   Oral   Take 1 capsule by mouth every 4 (four) hours as needed for headache.         . Cholecalciferol (VITAMIN D PO)   Oral   Take 1 tablet by mouth daily.         . cyclobenzaprine (FLEXERIL) 10 MG tablet   Oral   Take 10 mg by mouth as needed for muscle spasms.         . fish oil-omega-3 fatty acids 1000 MG capsule   Oral  Take 2 g by mouth daily.         . hydrochlorothiazide (HYDRODIURIL) 25 MG tablet   Oral   Take 1 tablet (25 mg total) by mouth daily.   90 tablet   1   . HYDROcodone-acetaminophen (NORCO/VICODIN) 5-325 MG per tablet   Oral   Take 1 tablet by mouth every 6 (six) hours as needed.   10 tablet   0   . levocetirizine (XYZAL) 5 MG tablet   Oral   Take 5 mg by mouth every evening.         Marland Kitchen lisinopril (PRINIVIL,ZESTRIL) 20 MG tablet   Oral   Take 1 tablet (20 mg total) by mouth daily.   90 tablet   1   . metoprolol (LOPRESSOR) 50 MG tablet   Oral   Take 150 mg by mouth daily.         . Multiple Vitamin (MULTIVITAMIN) capsule   Oral   Take 1 capsule by mouth daily.         . pravastatin (PRAVACHOL) 40 MG tablet   Oral   Take 1 tablet (40 mg total) by mouth every evening.   90 tablet   3    BP 146/72  Pulse 61  Temp(Src) 97.4 F (36.3 C) (Oral)  Resp 16  SpO2 98% Physical  Exam  Nursing note and vitals reviewed. Constitutional: She is oriented to person, place, and time. She appears well-developed and well-nourished.  Musculoskeletal: She exhibits tenderness.       Right shoulder: She exhibits decreased range of motion, tenderness, pain and decreased strength.       Arms: Neurological: She is alert and oriented to person, place, and time.  Skin: Skin is warm and dry.    ED Course  Procedures (including critical care time) Labs Review Labs Reviewed - No data to display Imaging Review Dg Shoulder Right  08/23/2013   CLINICAL DATA:  Right shoulder pain  EXAM: RIGHT SHOULDER - 2+ VIEW  COMPARISON:  DG SHOULDER *R* dated 02/02/2012  FINDINGS: No acute fracture.  No dislocation.  IMPRESSION: No acute bony pathology.   Electronically Signed   By: Maryclare Bean M.D.   On: 08/23/2013 19:55    EKG Interpretation    Date/Time:    Ventricular Rate:    PR Interval:    QRS Duration:   QT Interval:    QTC Calculation:   R Axis:     Text Interpretation:              MDM      Billy Fischer, MD 08/23/13 2009

## 2013-08-23 NOTE — ED Notes (Signed)
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

## 2013-08-23 NOTE — Discharge Instructions (Signed)
Wear sling for comfort, use ice pack and medicine for pain as needed, see orthopedistin next few days for recheck.

## 2013-09-05 ENCOUNTER — Encounter: Payer: BC Managed Care – PPO | Admitting: Gynecology

## 2013-09-21 ENCOUNTER — Telehealth: Payer: Self-pay | Admitting: *Deleted

## 2013-09-21 NOTE — Telephone Encounter (Signed)
sw pt informed her that [facility] will be on call 11/23/13. gv appt for 11/21/13@ 3:15pm w/ [facility]. Pt is aware...td

## 2013-09-25 ENCOUNTER — Ambulatory Visit (INDEPENDENT_AMBULATORY_CARE_PROVIDER_SITE_OTHER): Payer: BC Managed Care – PPO | Admitting: Surgery

## 2013-09-25 ENCOUNTER — Encounter (INDEPENDENT_AMBULATORY_CARE_PROVIDER_SITE_OTHER): Payer: Self-pay | Admitting: Surgery

## 2013-09-25 VITALS — BP 130/80 | HR 72 | Temp 98.2°F | Resp 14 | Ht 67.0 in | Wt 168.0 lb

## 2013-09-25 DIAGNOSIS — Z853 Personal history of malignant neoplasm of breast: Secondary | ICD-10-CM

## 2013-09-25 NOTE — Patient Instructions (Signed)
No further follow up needed.  Will have chronic pain issues on left side which may limit work.

## 2013-09-25 NOTE — Progress Notes (Signed)
NAME: Rexanna Commerford       DOB: 1950-09-10           DATE: 09/25/2013       MRN: [id number]   Jillian Chandler is a 63 y.o.Marland Kitchenfemale who presents for routine followup of her  left breast cancer stage 1 diagnosed in 2010 and treated with left mastectomy. She has no problems or concerns on either side.  PFSH: She has had no significant changes since the last visit here.  ROS: There have been no significant changes since the last visit here  EXAM: General: The patient is alert, oriented, generally healty appearing, NAD. Mood and affect are normal.  Breasts:  Left breast surgically absent.  Soreness around the scar is present no mass  Right breast normal.  Lymphatics: She has no axillary or supraclavicular adenopathy on either side.  Extremities: Full ROM of the surgical side with no lymphedema noted. She does have more pain this year  On left side   Data Reviewed: Mammogram 2014 stable birads 1  Impression: Doing well, with no evidence of recurrent cancer or new cancer She still has pain and this is limiting her ability to work on left side at scar site.  May need to look for less labor type of employment or seek disability.   Plan: Pt has graduated at this point.  Follow up as needed.

## 2013-09-27 ENCOUNTER — Emergency Department (INDEPENDENT_AMBULATORY_CARE_PROVIDER_SITE_OTHER): Payer: BC Managed Care – PPO

## 2013-09-27 ENCOUNTER — Encounter (HOSPITAL_COMMUNITY): Payer: Self-pay | Admitting: Emergency Medicine

## 2013-09-27 ENCOUNTER — Emergency Department (HOSPITAL_COMMUNITY)
Admission: EM | Admit: 2013-09-27 | Discharge: 2013-09-27 | Disposition: A | Discharge status: Home / Self Care | Payer: BC Managed Care – PPO | Source: Home / Self Care | Attending: Family Medicine | Admitting: Family Medicine

## 2013-09-27 DIAGNOSIS — M2241 Chondromalacia patellae, right knee: Secondary | ICD-10-CM

## 2013-09-27 DIAGNOSIS — M2240 Chondromalacia patellae, unspecified knee: Secondary | ICD-10-CM

## 2013-09-27 DIAGNOSIS — M25579 Pain in unspecified ankle and joints of unspecified foot: Secondary | ICD-10-CM

## 2013-09-27 DIAGNOSIS — R209 Unspecified disturbances of skin sensation: Secondary | ICD-10-CM

## 2013-09-27 DIAGNOSIS — R203 Hyperesthesia: Secondary | ICD-10-CM

## 2013-09-27 DIAGNOSIS — M25571 Pain in right ankle and joints of right foot: Secondary | ICD-10-CM

## 2013-09-27 MED ORDER — AMITRIPTYLINE HCL 25 MG PO TABS: 25.0000 mg | ORAL_TABLET | Freq: Every evening | ORAL | Status: DC | PRN

## 2013-09-27 MED ORDER — PREDNISONE 10 MG PO KIT: PACK | ORAL | Status: DC

## 2013-09-27 NOTE — ED Provider Notes (Signed)
Jillian Chandler is a 63 y.o. female who presents to Urgent Care today for right knee and right ankle pain. 1) right knee: Patient has had right knee pain for 3 months. This has occurred without injury. The pain is diffuse to anterior and worse with walking climbing stairs and prolonged sitting. She denies any locking or catching or significant swelling. She's tried some over-the-counter medications which have not helped much. She feels well otherwise. She works as a Engineer, building services.  2) right ankle: Patient is in 3 weeks of right ankle pain. This occurred without injury the pain is more in the medial side of the ankle and across the dorsal aspect of the midfoot for medial collateral. Pain is worse with activity and better with rest. She denies any radiating pain weakness or numbness. Again she's tried some over-the-counter medications which have not been very helpful.  Patient also notes that she recently is now 5 years status post mastectomy for breast cancer currently cancer free. She notes that she has chronic hyperesthesia at the incision site.   Past Medical History  Diagnosis Date  . Hypertension 2006  . Diabetes mellitus 06/2011    Dr. Kathlene November  . Diverticulitis     Per patient   . Breast cancer 01/2009    left brest- status post mastectomy, intolerant to Arimidex, declined tamoxfien  . Gastric ulcer     CLO (-) per EGD ~ 11-2011   History  Substance Use Topics  . Smoking status: Former Smoker    Quit date: 02/17/2007  . Smokeless tobacco: Never Used  . Alcohol Use: No   ROS as above Medications: No current facility-administered medications for this encounter.   Current Outpatient Prescriptions  Medication Sig Dispense Refill  . acetaminophen (TYLENOL) 500 MG tablet Take 1,000 mg by mouth every 6 (six) hours as needed. For pain      . amitriptyline (ELAVIL) 25 MG tablet Take 1 tablet (25 mg total) by mouth at bedtime as needed for sleep. spanish  30 tablet  1  .  butalbital-acetaminophen-caffeine (FIORICET WITH CODEINE) 50-325-40-30 MG per capsule Take 1 capsule by mouth every 4 (four) hours as needed for headache.      . Cholecalciferol (VITAMIN D PO) Take 1 tablet by mouth daily.      . cyclobenzaprine (FLEXERIL) 10 MG tablet Take 10 mg by mouth as needed for muscle spasms.      . fish oil-omega-3 fatty acids 1000 MG capsule Take 2 g by mouth daily.      . hydrochlorothiazide (HYDRODIURIL) 25 MG tablet Take 1 tablet (25 mg total) by mouth daily.  90 tablet  1  . lisinopril (PRINIVIL,ZESTRIL) 20 MG tablet Take 1 tablet (20 mg total) by mouth daily.  90 tablet  1  . metoprolol (LOPRESSOR) 50 MG tablet Take 150 mg by mouth daily.      . PredniSONE 10 MG KIT 12 day dose pack po. Spanish  1 kit  0    Exam:  BP 132/75  Pulse 64  Temp(Src) 97.8 F (36.6 C) (Oral)  Resp 18  SpO2 99% Gen: Well NAD  Right knee: Normal-appearing. Range of motion 0-120 with 2+ retropatellar crepitations on extension. Nontender medial lateral joint line. Stable ligamentous exam. Negative McMurray's test. Strength is intact extension and flexion  Left knee:  Normal-appearing. Range of motion 0-120 with 2+ retropatellar crepitations on extension. Nontender medial lateral joint line. Stable ligamentous exam. Negative McMurray's test. Strength is intact extension and flexion  Right ankle:  Normal-appearing. Mildly tender over the navicular prominence Full range of motion. Stable ligamentous exam. Negative talar tilt for anterior drawer.  Left ankle Normal-appearing. Nontender. Full range of motion. Stable ligamentous exam. Negative talar tilt for anterior drawer.  Right foot: Normal-appearing tender across the dorsal midfoot. Normal range of motion  Left foot: Normal-appearing tender across the dorsal midfoot. Normal range of motion  Of note the patient appears to experience more pain with just light touch than would otherwise be normal.  Neurovascular:  Sensation capillary Refill and pulses are intact distal bilateral lower extremities  No results found for this or any previous visit (from the past 24 hour(s)). Dg Ankle Complete Right  09/27/2013   CLINICAL DATA:  Knee and foot pain.  No known injury.  EXAM: RIGHT ANKLE - COMPLETE 3+ VIEW  COMPARISON:  None.  FINDINGS: No acute fracture or malalignment. No evidence of inflammatory arthropathy or significant degenerative change. There is a plantar calcaneal spur. No definite joint effusion.  IMPRESSION: Negative.   Electronically Signed   By: Jorje Guild M.D.   On: 09/27/2013 10:14   Dg Foot Complete Right  09/27/2013   CLINICAL DATA:  Pain  EXAM: RIGHT FOOT COMPLETE - 3+ VIEW  COMPARISON:  None.  FINDINGS: There is no evidence of fracture or dislocation. There is no evidence of arthropathy or other focal bone abnormality. Soft tissues are unremarkable.  IMPRESSION: Negative.   Electronically Signed   By: Misty Stanley M.D.   On: 09/27/2013 10:14   Dg Knee Ap/lat W/sunrise Right  09/27/2013   CLINICAL DATA:  Knee pain  EXAM: DG KNEE - 3 VIEWS  COMPARISON:  None.  FINDINGS: No acute fracture. No dislocation. Unremarkable soft tissues. There is very slight lateral subluxation of the patella with respect to the trochlear groove. Minimal degenerative change.  IMPRESSION: No acute bony pathology.  Mild chronic changes.   Electronically Signed   By: Maryclare Bean M.D.   On: 09/27/2013 09:43    Assessment and Plan: 63 y.o. female with right knee and ankle pain. This is more likely related to DJD or overuse. Discussed options. Plan for prednisone Dosepak for her general inflammation. Additionally will use amitriptyline. I believe the patient may have a component of hyperesthesia. Will use 25 mg of amitriptyline at bedtime. If this is not sufficient would recommend patient followup with [facility] [place] for further evaluation and management of this problem.  Discussed warning signs or  symptoms. Please see discharge instructions. Patient expresses understanding.    Gregor Hams, MD 09/27/13 [phone]

## 2013-09-27 NOTE — Discharge Instructions (Signed)
Gracias por venir hoy.  [place] en 3-4 semanas.   Artralgia  (Arthralgia)  La artralgia es el dolor en las articulaciones. 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 El dolor articular puede ocurrir por diversos motivos. La articulacin puede tener un hematoma, una infeccin, volverse rgida o debilitarse por la edad. El dolor generalmente desaparece despus de hacer reposo y tomar medicamentos para Glass blower/designer.  CUIDADOS EN EL HOGAR   Haga descansar la articulacin tal como le indic el mdico.  Mantenga arriba (elevada) la articulacin dolorida durante las primeras 24 horas.  Aplique hielo sobre la zona.  Ponga el hielo en una bolsa plstica.  Colquese una toalla entre la piel y la bolsa de hielo.  Deje el hielo durante 15 a 20 minutos, 3 a 4 veces por da.  Use un cabestrillo, un yeso o venda elstica segn las indicaciones.  Tome los medicamentos segn le indique el mdico. No tome aspirina.  Utilice las [employer] le haya indicado el mdico. No aplique el peso sobre la articulacin hasta que el mdico lo autorice. SOLICITE AYUDA DE INMEDIATO SI:   Tiene un hematoma, inflamacin (hinchazn) o aumenta el dolor.  [place] los pies estn azules o comienza a perder la sensibilidad (adormecimiento).  Los medicamentos no Buyer, retail.  El dolor se hace ms intenso.  La temperatura oral le sube a ms de 38,9 C (102 F), y no puede bajarla con medicamentos.  No puede mover o Actor. ASEGRESE DE QUE:   Comprende estas instrucciones.  Controlar su enfermedad.  Solicitar ayuda de inmediato si no mejora o si empeora. Document Released: 07/09/2011 Document Revised: 10/12/2011 [facility] Patient Information 2014 [place], [place].

## 2013-09-27 NOTE — ED Notes (Signed)
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

## 2013-10-02 ENCOUNTER — Telehealth: Payer: Self-pay | Admitting: *Deleted

## 2013-10-02 ENCOUNTER — Ambulatory Visit (HOSPITAL_BASED_OUTPATIENT_CLINIC_OR_DEPARTMENT_OTHER): Payer: BC Managed Care – PPO | Admitting: Oncology

## 2013-10-02 VITALS — BP 126/76 | HR 70 | Temp 98.2°F | Resp 18 | Ht 67.0 in | Wt 169.2 lb

## 2013-10-02 DIAGNOSIS — Z170 Estrogen receptor positive status [ER+]: Secondary | ICD-10-CM

## 2013-10-02 DIAGNOSIS — C50912 Malignant neoplasm of unspecified site of left female breast: Secondary | ICD-10-CM | POA: Insufficient documentation

## 2013-10-02 DIAGNOSIS — D0590 Unspecified type of carcinoma in situ of unspecified breast: Secondary | ICD-10-CM

## 2013-10-02 NOTE — Telephone Encounter (Signed)
10/02/2013 Pt came by office and I spoke with husband by phone.  Pt has appt 10/20/2013 with Social Security for disability and would like to communicate with Dr Larose Kells before that appointment.  Thank You

## 2013-10-02 NOTE — Progress Notes (Signed)
ID: Buford Dresser   DOB: [birth year]  MR#: [id number]  QAE#:[id number]  PCP: Kathlene November, MD  HISTORY OF PRESENT ILLNESS: The patient had not had mammography for approximately 10 years.  More recently, she felt her breasts were a little bit more full than usual and she was set up for mammography I believe at [place] (the former [facility]).  I do not have that report, but she was recalled, brought back for a diagnostic mammogram because of microcalcifications and core biopsy was obtained January 08, 2009 showing ([id number]) an area of ductal carcinoma in situ with necrosis, high grade, ER positive at 100%, PR positive at 19%.    The patient was referred to Dr. Brantley Stage and bilateral breast MRIs were obtained June 23rd.  They showed the biopsy clip in the medial left breast and a 7 mm enhancing nodule near that.  There was a complex parenchymal pattern bilaterally, right greater than left, and there were several nodules in the right breast which were felt to be a little bit suspicious.  There was also a 1.2 cm enhancing area in the sternum with sternal metastases not excluded.  The patient had a right breast ultrasound showing no sonographic evidence of malignancy.  There was only a 6 mm normal appearing intramammary lymph node.  On August 4th, she had biopsy of the suspicious area on the right and this showed a fibroadenoma with no malignant features ([id number]).    Her subsequent evaluation and treatment is as detailed below.  INTERVAL HISTORY: Jillian Chandler returns today for an unscheduled visit. She is getting ready to apply for disability, and wanted me to be aware. 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  REVIEW OF SYSTEMS:  Tationa continues to have problems with her left axilla. This makes it very difficult for her to mop. She had to change her job somewhat because she was having right-sided pain involving her ankle, knee, hip, and  shoulder. The pain in the right shoulder was very acute. She had films regarding each one of these areas, which showed no acute abnormality. She has had no fever, rash, or bleeding. A detailed review of systems today was otherwise noncontributory  PAST MEDICAL HISTORY: Past Medical History  Diagnosis Date  . Hypertension 2006  . Diabetes mellitus 06/2011    Dr. Kathlene November  . Diverticulitis     Per patient   . Breast cancer 01/2009    left brest- status post mastectomy, intolerant to Arimidex, declined tamoxfien  . Gastric ulcer     CLO (-) per EGD ~ 11-2011  Significant for hypertension, osteoarthritis, history of migraines and status post C-section.    PAST SURGICAL HISTORY: Past Surgical History  Procedure Laterality Date  . Mastectomy Left 04/2009  . Colonoscopy w/ polypectomy  11/2011    FAMILY HISTORY Family History  Problem Relation Age of Onset  . Heart attack Father 60    F age 39, M age 5s  . Heart disease Father   . Stroke Neg Hx   . Colon cancer Neg Hx   . Breast cancer Neg Hx   . Diabetes Sister   . Diabetes Brother   . Diabetes Maternal Aunt   . Diabetes Maternal Uncle   . Diabetes Paternal Aunt   . Diabetes Paternal Uncle   . Diabetes Maternal Grandmother   . Diabetes Maternal Grandfather   . Diabetes Paternal Grandmother   . Diabetes Paternal Grandfather   .  Diabetes Sister   . Heart disease Mother   The patient's father died at the age of 77 from heart disease.  The patient's mother died at the age of 53 from myocardial infarction.  The patient also has a sister who died from a subarachnoid hemorrhage.  Another one died from complications of diabetes. She has two surviving sisters and three brothers.  There is no history of breast or ovarian cancer in the family.    GYNECOLOGIC HISTORY: The patient carried 4 children to term; first child to term at age 54.  She has been having irregular periods, once every few months for several years now. She is having hot  flashes as well.   She is not on any hormone replacement.    SOCIAL HISTORY: The patient works for [employer] in [employer].  Her husband  [name] (same last name) works for [employer] in [employer]. The patient's daughter, Cranmer, who is a Chief Executive Officer and passed the bar in [place], has moved to this area.  She is studying for the bar here.  A son lives in [place].  His name is Hampton.  A son, [name], is currently attending [employer] trying to get his GED.  Son [name] attends [employer] and hopes to become a sports medicine specialist.  He is a Pharmacist, community.  The patient was born in [place]. She belongs to an [employer].    ADVANCED DIRECTIVES:  HEALTH MAINTENANCE: History  Substance Use Topics  . Smoking status: Former Smoker    Quit date: 02/17/2007  . Smokeless tobacco: Never Used  . Alcohol Use: No     Colonoscopy: never  PAP: UTD  Bone density: Fernandez/nl  Lipid panel:  No Known Allergies  Current Outpatient Prescriptions  Medication Sig Dispense Refill  . acetaminophen (TYLENOL) 500 MG tablet Take 1,000 mg by mouth every 6 (six) hours as needed. For pain      . amitriptyline (ELAVIL) 25 MG tablet Take 1 tablet (25 mg total) by mouth at bedtime as needed for sleep. spanish  30 tablet  1  . butalbital-acetaminophen-caffeine (FIORICET WITH CODEINE) 50-325-40-30 MG per capsule Take 1 capsule by mouth every 4 (four) hours as needed for headache.      . Cholecalciferol (VITAMIN D PO) Take 1 tablet by mouth daily.      . cyclobenzaprine (FLEXERIL) 10 MG tablet Take 10 mg by mouth as needed for muscle spasms.      . fish oil-omega-3 fatty acids 1000 MG capsule Take 2 g by mouth daily.      . hydrochlorothiazide (HYDRODIURIL) 25 MG tablet Take 1 tablet (25 mg total) by mouth daily.  90 tablet  1  . lisinopril (PRINIVIL,ZESTRIL) 20 MG tablet Take 1 tablet (20 mg total) by mouth daily.  90 tablet  1  . metoprolol (LOPRESSOR) 50 MG tablet Take 150 mg by mouth daily.       . PredniSONE 10 MG KIT 12 day dose pack po. Spanish  1 kit  0   No current facility-administered medications for this visit.    OBJECTIVE: Middle-aged Latin American woman who appears stated age 63 Vitals:   10/02/13 1605  BP: 126/76  Pulse: 70  Temp: 98.2 F (36.8 C)  Resp: 18     Body mass index is 26.49 kg/(m^2).    ECOG FS: 1  Sclerae unicteric, pupils equal and reactive Oropharynx clear and moist No cervical or supraclavicular adenopathy Lungs no rales or rhonchi Heart regular rate and  rhythm Abd soft, nontender, positive bowel sounds MSK no focal spinal tenderness, no peripheral edema; I do not palpate any area of swelling in the right shoulder or left anterior chest wall or axilla.  Neuro: nonfocal, well oriented, pleasant affect Breasts: right breast, no suspicious findings; left breast status post mastectomy, no evidence of local recurrence; left axilla is benign  LAB RESULTS: Lab Results  Component Value Date   WBC 8.5 04/29/2013   NEUTROABS 4.3 04/29/2013   HGB 14.8 04/29/2013   HCT 42.3 04/29/2013   MCV 85.6 04/29/2013   PLT 335 04/29/2013      Chemistry      Component Value Date/Time   NA 138 04/29/2013 1327   NA 139 11/09/2012 1510   K 4.1 04/29/2013 1327   K 3.7 11/09/2012 1510   CL 102 04/29/2013 1327   CL 101 11/09/2012 1510   CO2 30 04/29/2013 1327   CO2 30* 11/09/2012 1510   BUN 17 04/29/2013 1327   BUN 15.9 11/09/2012 1510   CREATININE 0.52 04/29/2013 1327   CREATININE 0.7 11/09/2012 1510      Component Value Date/Time   CALCIUM 9.9 04/29/2013 1327   CALCIUM 9.5 11/09/2012 1510   ALKPHOS 75 04/29/2013 1327   ALKPHOS 84 11/09/2012 1510   AST 15 04/29/2013 1327   AST 16 11/09/2012 1510   ALT 19 04/29/2013 1327   ALT 21 11/09/2012 1510   BILITOT 0.3 04/29/2013 1327   BILITOT 0.33 11/09/2012 1510       No results found for this basename: LABCA2    No components found with this basename: LABCA125    No results found for this basename: INR,  in the last 168  hours  Urinalysis    Component Value Date/Time   COLORURINE YELLOW 02/17/2012 [place] 02/17/2012 1641   LABSPEC 1.010 02/17/2012 1641   PHURINE 6.0 02/17/2012 1641   GLUCOSEU NEG 02/17/2012 1641   HGBUR NEG 02/17/2012 1641   BILIRUBINUR NEG 02/17/2012 1641   BILIRUBINUR neg 11/25/2010   KETONESUR NEG 02/17/2012 1641   PROTEINUR NEG 02/17/2012 1641   UROBILINOGEN 0.2 02/17/2012 1641   UROBILINOGEN neg 11/25/2010   NITRITE NEG 02/17/2012 1641   NITRITE neg 11/25/2010   LEUKOCYTESUR NEG 02/17/2012 1641    STUDIES: Mammography 10/17/2012 was unremarkable; repeat exam   ASSESSMENT: 63 y.o.  Spanish speaker, status post left breast biopsy June of 2010 for high-grade ductal carcinoma in situ, estrogen receptor 100% positive, progesterone receptor positive at 19%,  (1) status post left mastectomy and sentinel lymph node sampling September of 2010.  There was a less than 1 mm area of microinvasion along with the ductal carcinoma in situ, margins were ample, and 1/3 sentinel lymph nodes had some isolated tumor cells.   (2) She tried anastrozole briefly between October of 2010 and March of 2011 at which time it was discontinued because of myalgias/arthralgias.  She is now being followed with observation alone.   PLAN: Taeko is doing fine as far as her breast cancer is concerned, with no evidence of disease recurrence. She is having significant pain problems, which she is treating with Tylenol and Aleve. These are interfering with her work. Plain films of these areas have been nonrevealing. Certainly in the left axilla and left chest wall, where she had surgery, many patients complained of dysesthesia and pain, as one would expect following any kind of trauma. She also has pain in the right shoulder, which may be due to bursitis,  and in the right hip and knee as well as the right ankle.  I am canceling your appointment after her mammogram, and we will call her if there is any abnormality  there. Otherwise she will see me again in September. That will be her "5 year anniversary" and likely she will "graduate" from followup here at that time.  Mercy Malena C    10/02/2013

## 2013-10-05 ENCOUNTER — Telehealth: Payer: Self-pay | Admitting: *Deleted

## 2013-10-05 NOTE — Telephone Encounter (Signed)
sw pt informed her that April appts are cancel. gv appt for 04/05/14 labs @ 3:30pm and 04/12/14 @ 3:30pm for ov. Pt is aware of her appts...td

## 2013-10-05 NOTE — Telephone Encounter (Signed)
Please schedule a OV

## 2013-10-05 NOTE — Telephone Encounter (Signed)
Pt scheduled  

## 2013-10-16 ENCOUNTER — Ambulatory Visit: Payer: BC Managed Care – PPO | Admitting: Internal Medicine

## 2013-10-16 DIAGNOSIS — Z0289 Encounter for other administrative examinations: Secondary | ICD-10-CM

## 2013-11-16 ENCOUNTER — Other Ambulatory Visit: Payer: BC Managed Care – PPO

## 2013-11-21 ENCOUNTER — Ambulatory Visit: Payer: BC Managed Care – PPO | Admitting: Physician Assistant

## 2013-11-23 ENCOUNTER — Ambulatory Visit: Payer: BC Managed Care – PPO | Admitting: Oncology

## 2014-03-01 ENCOUNTER — Other Ambulatory Visit: Payer: Self-pay | Admitting: Gastroenterology

## 2014-03-01 DIAGNOSIS — R1011 Right upper quadrant pain: Secondary | ICD-10-CM

## 2014-03-05 ENCOUNTER — Ambulatory Visit
Admission: RE | Admit: 2014-03-05 | Discharge: 2014-03-05 | Disposition: A | Discharge status: Home / Self Care | Payer: BC Managed Care – PPO | Source: Ambulatory Visit | Attending: Gastroenterology | Admitting: Gastroenterology

## 2014-03-05 DIAGNOSIS — R1011 Right upper quadrant pain: Secondary | ICD-10-CM

## 2014-04-05 ENCOUNTER — Other Ambulatory Visit: Payer: BC Managed Care – PPO

## 2014-04-12 ENCOUNTER — Ambulatory Visit: Payer: BC Managed Care – PPO | Admitting: Oncology

## 2014-04-16 ENCOUNTER — Telehealth: Payer: Self-pay | Admitting: Oncology

## 2014-04-16 NOTE — Telephone Encounter (Signed)
Sent letter to patient from Dr. Magrinat. °

## 2014-05-24 ENCOUNTER — Telehealth: Payer: Self-pay | Admitting: Oncology

## 2014-05-24 NOTE — Telephone Encounter (Signed)
, °

## 2014-06-04 ENCOUNTER — Encounter (HOSPITAL_COMMUNITY): Payer: Self-pay | Admitting: Emergency Medicine

## 2014-07-05 ENCOUNTER — Encounter: Payer: Self-pay | Admitting: Oncology

## 2014-07-05 ENCOUNTER — Ambulatory Visit (HOSPITAL_COMMUNITY)
Admission: RE | Admit: 2014-07-05 | Discharge: 2014-07-05 | Disposition: A | Discharge status: Home / Self Care | Payer: Medicaid Other | Source: Ambulatory Visit | Attending: Oncology | Admitting: Oncology

## 2014-07-05 ENCOUNTER — Other Ambulatory Visit (HOSPITAL_BASED_OUTPATIENT_CLINIC_OR_DEPARTMENT_OTHER): Payer: Self-pay

## 2014-07-05 ENCOUNTER — Telehealth: Payer: Self-pay | Admitting: Oncology

## 2014-07-05 ENCOUNTER — Ambulatory Visit (HOSPITAL_BASED_OUTPATIENT_CLINIC_OR_DEPARTMENT_OTHER): Payer: Self-pay | Admitting: Oncology

## 2014-07-05 VITALS — BP 137/86 | HR 73 | Temp 97.8°F | Resp 18 | Ht 67.0 in | Wt 171.9 lb

## 2014-07-05 DIAGNOSIS — C50912 Malignant neoplasm of unspecified site of left female breast: Secondary | ICD-10-CM

## 2014-07-05 DIAGNOSIS — Z9012 Acquired absence of left breast and nipple: Secondary | ICD-10-CM | POA: Diagnosis not present

## 2014-07-05 DIAGNOSIS — Z853 Personal history of malignant neoplasm of breast: Secondary | ICD-10-CM

## 2014-07-05 LAB — CBC WITH DIFFERENTIAL/PLATELET
BASO%: 0.8 % (ref 0.0–2.0)
BASOS ABS: 0 10*3/uL (ref 0.0–0.1)
EOS%: 2.8 % (ref 0.0–7.0)
Eosinophils Absolute: 0.2 10*3/uL (ref 0.0–0.5)
HEMATOCRIT: 44.5 % (ref 34.8–46.6)
HEMOGLOBIN: 14.7 g/dL (ref 11.6–15.9)
LYMPH%: 47.7 % (ref 14.0–49.7)
MCH: 28.5 pg (ref 25.1–34.0)
MCHC: 33.1 g/dL (ref 31.5–36.0)
MCV: 86.2 fL (ref 79.5–101.0)
MONO#: 0.6 10*3/uL (ref 0.1–0.9)
MONO%: 9.3 % (ref 0.0–14.0)
NEUT#: 2.4 10*3/uL (ref 1.5–6.5)
NEUT%: 39.4 % (ref 38.4–76.8)
PLATELETS: 306 10*3/uL (ref 145–400)
RBC: 5.16 10*6/uL (ref 3.70–5.45)
RDW: 13 % (ref 11.2–14.5)
WBC: 6.1 10*3/uL (ref 3.9–10.3)
lymph#: 2.9 10*3/uL (ref 0.9–3.3)

## 2014-07-05 LAB — COMPREHENSIVE METABOLIC PANEL (CC13)
ALT: 23 U/L (ref 0–55)
AST: 18 U/L (ref 5–34)
Albumin: 3.9 g/dL (ref 3.5–5.0)
Alkaline Phosphatase: 80 U/L (ref 40–150)
Anion Gap: 8 mEq/L (ref 3–11)
BILIRUBIN TOTAL: 0.61 mg/dL (ref 0.20–1.20)
BUN: 11.5 mg/dL (ref 7.0–26.0)
CO2: 30 mEq/L — ABNORMAL HIGH (ref 22–29)
CREATININE: 0.7 mg/dL (ref 0.6–1.1)
Calcium: 10.3 mg/dL (ref 8.4–10.4)
Chloride: 104 mEq/L (ref 98–109)
EGFR: >90 mL/min/{1.73_m2} (ref >90)
Glucose: 100 mg/dl (ref 70–140)
Potassium: 4 mEq/L (ref 3.5–5.1)
Sodium: 143 mEq/L (ref 136–145)
Total Protein: 7.4 g/dL (ref 6.4–8.3)

## 2014-07-05 NOTE — Telephone Encounter (Signed)
, °

## 2014-07-05 NOTE — Progress Notes (Signed)
ID: Jillian Chandler   DOB: [birth year]  MR#: [id number]  ERX#:[id number]  PCP: Jillian November, MD  HISTORY OF PRESENT ILLNESS: The patient had not had mammography for approximately 10 years.  More recently, she felt her breasts were a little bit more full than usual and she was set up for mammography I believe at [place] (the former [facility]).  I do not have that report, but she was recalled, brought back for a diagnostic mammogram because of microcalcifications and core biopsy was obtained January 08, 2009 showing ([id number]) an area of ductal carcinoma in situ with necrosis, high grade, ER positive at 100%, PR positive at 19%.    The patient was referred to Dr. Brantley Stage and bilateral breast MRIs were obtained June 23rd.  They showed the biopsy clip in the medial left breast and a 7 mm enhancing nodule near that.  There was a complex parenchymal pattern bilaterally, right greater than left, and there were several nodules in the right breast which were felt to be a little bit suspicious.  There was also a 1.2 cm enhancing area in the sternum with sternal metastases not excluded.  The patient had a right breast ultrasound showing no sonographic evidence of malignancy.  There was only a 6 mm normal appearing intramammary lymph node.  On August 4th, she had biopsy of the suspicious area on the right and this showed a fibroadenoma with no malignant features ([id number]).    Her subsequent evaluation and treatment is as detailed below.  INTERVAL HISTORY: Jillian Chandler returns today for a scheduled visit. She has applied for disability. She is no longer working due to multiple arthralgias particularly in her axillae (left worse than right).   REVIEW OF SYSTEMS:  Hurley continues to have problems with her bilateral axillae. Left side is worse and has been more pronounced over the past 3 weeks. Has taken Tylenol which has helped some. She has not been able to work for about 5 months due to pain. Disability is  pending. Has not noticed any changes to her left mastectomy site. Has not noticed any masses or skin changes to her right breast. She has had no fever, rash, or bleeding. A detailed review of systems today was otherwise noncontributory  PAST MEDICAL HISTORY: Past Medical History  Diagnosis Date  . Hypertension 2006  . Diabetes mellitus 06/2011    Dr. Kathlene Chandler  . Diverticulitis     Per patient   . Breast cancer 01/2009    left brest- status post mastectomy, intolerant to Arimidex, declined tamoxfien  . Gastric ulcer     CLO (-) per EGD ~ 11-2011  Significant for hypertension, osteoarthritis, history of migraines and status post C-section.    PAST SURGICAL HISTORY: Past Surgical History  Procedure Laterality Date  . Mastectomy Left 04/2009  . Colonoscopy w/ polypectomy  11/2011    FAMILY HISTORY Family History  Problem Relation Age of Onset  . Heart attack Father 69    F age 60, M age 50s  . Heart disease Father   . Stroke Neg Hx   . Colon cancer Neg Hx   . Breast cancer Neg Hx   . Diabetes Sister   . Diabetes Brother   . Diabetes Maternal Aunt   . Diabetes Maternal Uncle   . Diabetes Paternal Aunt   . Diabetes Paternal Uncle   . Diabetes Maternal Grandmother   . Diabetes Maternal Grandfather   . Diabetes Paternal Grandmother   . Diabetes Paternal Grandfather   .  Diabetes Sister   . Heart disease Mother   The patient's father died at the age of 53 from heart disease.  The patient's mother died at the age of 84 from myocardial infarction.  The patient also has a sister who died from a subarachnoid hemorrhage.  Another one died from complications of diabetes. She has two surviving sisters and three brothers.  There is no history of breast or ovarian cancer in the family.    GYNECOLOGIC HISTORY: The patient carried 4 children to term; first child to term at age 6.  She has been having irregular periods, once every few months for several years now. She is having hot flashes as  well.   She is not on any hormone replacement.    SOCIAL HISTORY: The patient previously worked for [employer] in [employer].  Her husband  Jillian Chandler (same last name) works for [employer] in [employer]. The patient's daughter, Jillian Chandler, who is a Chief Executive Officer and passed the bar in [place], has moved to this area.  She is studying for the bar here.  A son lives in [place].  His name is Jillian Chandler.  A son, Jillian Chandler, is currently attending [employer] trying to get his GED.  Son Jillian Chandler attends [employer] and hopes to become a sports medicine specialist.  He is a Pharmacist, community.  The patient was born in [place]. She belongs to an [employer].    ADVANCED DIRECTIVES:  HEALTH MAINTENANCE: History  Substance Use Topics  . Smoking status: Former Smoker    Quit date: 02/17/2007  . Smokeless tobacco: Never Used  . Alcohol Use: No     Colonoscopy: never  PAP: UTD  Bone density: Fernandez/nl  Lipid panel:  No Known Allergies  Current Outpatient Prescriptions  Medication Sig Dispense Refill  . acetaminophen (TYLENOL) 500 MG tablet Take 1,000 mg by mouth every 6 (six) hours as needed. For pain    . fish oil-omega-3 fatty acids 1000 MG capsule Take 2 g by mouth daily.    . hydrochlorothiazide (HYDRODIURIL) 25 MG tablet Take 1 tablet (25 mg total) by mouth daily. 90 tablet 1  . lisinopril (PRINIVIL,ZESTRIL) 20 MG tablet Take 1 tablet (20 mg total) by mouth daily. 90 tablet 1  . metoprolol (LOPRESSOR) 50 MG tablet Take 150 mg by mouth daily.     No current facility-administered medications for this visit.    OBJECTIVE: Middle-aged Latin American woman who appears stated age 63 Vitals:   07/05/14 1032  BP: 137/86  Pulse: 73  Temp: 97.8 F (36.6 C)  Resp: 18     Body mass index is 26.92 kg/(m^2).    ECOG FS: 1  Sclerae unicteric, pupils equal and reactive Oropharynx clear and moist No cervical or supraclavicular adenopathy Lungs no rales or rhonchi Heart regular rate and rhythm Abd  soft, nontender, positive bowel sounds MSK no focal spinal tenderness, no peripheral edema; I do not palpate any area of swelling in the right shoulder or left anterior chest wall or axilla.  Neuro: nonfocal, well oriented, pleasant affect Breasts: right breast, no suspicious findings; left breast status post mastectomy, no evidence of local recurrence; left axilla is benign. Complains of discomfort to her axillae bilaterally with palpation.  LAB RESULTS: Lab Results  Component Value Date   WBC 6.1 07/05/2014   NEUTROABS 2.4 07/05/2014   HGB 14.7 07/05/2014   HCT 44.5 07/05/2014   MCV 86.2 07/05/2014   PLT 306 07/05/2014      Chemistry  Component Value Date/Time   NA 143 07/05/2014 1011   NA 138 04/29/2013 1327   K 4.0 07/05/2014 1011   K 4.1 04/29/2013 1327   CL 102 04/29/2013 1327   CL 101 11/09/2012 1510   CO2 30* 07/05/2014 1011   CO2 30 04/29/2013 1327   BUN 11.5 07/05/2014 1011   BUN 17 04/29/2013 1327   CREATININE 0.7 07/05/2014 1011   CREATININE 0.52 04/29/2013 1327      Component Value Date/Time   CALCIUM 10.3 07/05/2014 1011   CALCIUM 9.9 04/29/2013 1327   ALKPHOS 80 07/05/2014 1011   ALKPHOS 75 04/29/2013 1327   AST 18 07/05/2014 1011   AST 15 04/29/2013 1327   ALT 23 07/05/2014 1011   ALT 19 04/29/2013 1327   BILITOT 0.61 07/05/2014 1011   BILITOT 0.3 04/29/2013 1327       No results found for: LABCA2  No components found for: LABCA125  No results for input(s): INR in the last 168 hours.  Urinalysis    Component Value Date/Time   COLORURINE YELLOW 02/17/2012 1641   APPEARANCEUR CLEAR 02/17/2012 1641   LABSPEC 1.010 02/17/2012 1641   PHURINE 6.0 02/17/2012 1641   GLUCOSEU NEG 02/17/2012 1641   HGBUR NEG 02/17/2012 1641   BILIRUBINUR NEG 02/17/2012 1641   BILIRUBINUR neg 11/25/2010   KETONESUR NEG 02/17/2012 1641   PROTEINUR NEG 02/17/2012 1641   PROTEINUR neg 11/25/2010   UROBILINOGEN 0.2 02/17/2012 1641   UROBILINOGEN neg 11/25/2010    NITRITE NEG 02/17/2012 1641   NITRITE neg 11/25/2010   LEUKOCYTESUR NEG 02/17/2012 1641    STUDIES: Mammography 10/17/2012 was unremarkable; repeat exam   ASSESSMENT: 63 y.o.  Spanish speaker, status post left breast biopsy June of 2010 for high-grade ductal carcinoma in situ, estrogen receptor 100% positive, progesterone receptor positive at 19%,  (1) status post left mastectomy and sentinel lymph node sampling September of 2010.  There was a less than 1 mm area of microinvasion along with the ductal carcinoma in situ, margins were ample, and 1/3 sentinel lymph nodes had some isolated tumor cells.   (2) She tried anastrozole briefly between October of 2010 and March of 2011 at which time it was discontinued because of myalgias/arthralgias.  She is now being followed with observation alone.   PLAN: Jawana is doing fine as far as her breast cancer is concerned, with no evidence of disease recurrence. She is having significant pain problems, which she is treating with Tylenol. These are interfering with her ability to work. Plain films of these areas have been nonrevealing int he past, however, given her increase in pain to the bilateral axillae and upper ribs over the past 3 weeks, will obtain a CXR today. Certainly in the left axilla and left chest wall, where she had surgery, many patients complained of dysesthesia and pain, as one would expect following any kind of trauma.   Since she is now over 5 years out from diagnosis, we will have her see her PCP for ongoing medical care and will see her back with [place] on an as needed basis. She was given Dr. Virgie Dad card to call if she has issues. If her CXR shows any abnormality, we will schedule her back with [place].  Mikey Bussing    07/05/2014     ADDENDUM: I met with Anayelli today together with our extender. I am comfortable releasing Kanisha to her primary care physician and wanted to make sure she was comfortable also. She does have this unusual  bilateral axillary discomfort, but when I examined her there was no tenderness to palpation and no abnormality by inspection. Just in case we obtained a chest x-ray which was unremarkable.  Dg Chest 2 View  07/05/2014   CLINICAL DATA:  Left breast cancer.  EXAM: CHEST  2 VIEW  COMPARISON:  04/29/2013.  FINDINGS: Mediastinum and hilar structures normal. Lungs are clear. No pleural effusion or pneumothorax. No acute bony abnormality. Degenerative changes thoracic spine. Left mastectomy.  IMPRESSION: No active cardiopulmonary disease.   Electronically Signed   By: Marcello Moores  Register   On: 07/05/2014 14:42   Rosezetta understands we keep her records open for another 10 years. That means if she has any questions or problems she can call me or she can come in and I will be glad to evaluate her. All she will need in terms of breast cancer follow-up is set physician yearly breast exam and yearly mammography. As of this point, then, we are not making any further routine appointments for her here.  I personally saw this patient and performed a substantive portion of this encounter with the listed APP documented above.   Chauncey Cruel, MD

## 2014-07-05 NOTE — Progress Notes (Signed)
I advised the patient and intruptr she has to be in active treatment to apply for alight funds. I gave her application for asst for [facility] bill. She said she did apply for medicaid about 81months ago. I made note for her to incldue caseworker name/ph with application for asst.. She has no insurance.

## 2014-07-09 ENCOUNTER — Telehealth: Payer: Self-pay | Admitting: Oncology

## 2014-07-09 NOTE — Telephone Encounter (Signed)
Faxed letter to Dr. Larose Kells

## 2014-07-12 ENCOUNTER — Telehealth: Payer: Self-pay | Admitting: *Deleted

## 2014-07-12 NOTE — Telephone Encounter (Signed)
Pt Dr. Jana Hakim pt called with chest x-ray results. Spoke with pt and she placed her daughter-in-law, Verlan Friends, on the phone who speaks [place]. Passed along normal chest x-ray results and Etheline told pt while I was still on the phone. Told them to call us back with any further concerns and they were agreeable to this.

## 2015-03-13 ENCOUNTER — Emergency Department (HOSPITAL_COMMUNITY): Payer: Medicaid Other

## 2015-03-13 ENCOUNTER — Encounter (HOSPITAL_COMMUNITY): Payer: Self-pay | Admitting: Emergency Medicine

## 2015-03-13 ENCOUNTER — Emergency Department (HOSPITAL_COMMUNITY)
Admission: EM | Admit: 2015-03-13 | Discharge: 2015-03-14 | Disposition: A | Discharge status: Home / Self Care | Payer: Medicaid Other | Attending: Emergency Medicine | Admitting: Emergency Medicine

## 2015-03-13 DIAGNOSIS — Z8719 Personal history of other diseases of the digestive system: Secondary | ICD-10-CM | POA: Insufficient documentation

## 2015-03-13 DIAGNOSIS — Z79899 Other long term (current) drug therapy: Secondary | ICD-10-CM | POA: Diagnosis not present

## 2015-03-13 DIAGNOSIS — Z87891 Personal history of nicotine dependence: Secondary | ICD-10-CM | POA: Insufficient documentation

## 2015-03-13 DIAGNOSIS — M1711 Unilateral primary osteoarthritis, right knee: Secondary | ICD-10-CM | POA: Diagnosis not present

## 2015-03-13 DIAGNOSIS — Z791 Long term (current) use of non-steroidal anti-inflammatories (NSAID): Secondary | ICD-10-CM | POA: Insufficient documentation

## 2015-03-13 DIAGNOSIS — M1990 Unspecified osteoarthritis, unspecified site: Secondary | ICD-10-CM

## 2015-03-13 DIAGNOSIS — Z853 Personal history of malignant neoplasm of breast: Secondary | ICD-10-CM | POA: Insufficient documentation

## 2015-03-13 DIAGNOSIS — I10 Essential (primary) hypertension: Secondary | ICD-10-CM | POA: Insufficient documentation

## 2015-03-13 DIAGNOSIS — M25561 Pain in right knee: Secondary | ICD-10-CM | POA: Diagnosis present

## 2015-03-13 DIAGNOSIS — E119 Type 2 diabetes mellitus without complications: Secondary | ICD-10-CM | POA: Diagnosis not present

## 2015-03-13 NOTE — ED Notes (Signed)
Family states pt has been c/o pain to her right knee for a while now and has been to the dr for it but nothing is being done to help it  Pt denies injury to her knee  Pt states pain is on the inside of the knee and around the knee cap on the bottom side

## 2015-03-14 MED ORDER — MELOXICAM 7.5 MG PO TABS
7.5000 mg | ORAL_TABLET | Freq: Every day | ORAL | Status: DC
  Administered 2015-03-14: 7.5 mg via ORAL
  Filled 2015-03-14: qty 1

## 2015-03-14 MED ORDER — MELOXICAM 7.5 MG PO TABS: 7.5000 mg | ORAL_TABLET | Freq: Every day | ORAL | Status: DC

## 2015-03-14 MED ORDER — HYDROCODONE-ACETAMINOPHEN 5-325 MG PO TABS: 1.0000 | ORAL_TABLET | ORAL | Status: DC | PRN

## 2015-03-14 MED ORDER — HYDROCODONE-ACETAMINOPHEN 5-325 MG PO TABS
1.0000 | ORAL_TABLET | Freq: Once | ORAL | Status: AC
  Administered 2015-03-14: 1 via ORAL
  Filled 2015-03-14: qty 1

## 2015-03-14 NOTE — Discharge Instructions (Signed)
Artritis - [employer]  (Arthritis, Nonspecific)  La artritis se presenta como dolor, enrojecimiento, calor o hinchazn (inflamacin) en una articulacin. La articulacin puede estar rgida o doler al [employer]. Pueden verse involucradas una o ms articulaciones. Hay diferentes tipos de artritis. Puede ser que el mdico no sepa inmediatamente el tipo que usted padece. La causa ms frecuente es el desgaste de la articulacin (osteoartritis). CUIDADOS EN EL HOGAR   Slo tome los medicamentos segn le indique el mdico.  Haga reposo todo el tiempo que pueda.  Levante (eleve) la articulacin si est hinchada.  Utilice muletas si la articulacin que le duele es de la pierna.  Beba gran cantidad de lquido para mantener el pis (orina) de tono claro o amarillo plido.  Siga las instrucciones de su mdico sobre la dieta.  Si siente mucho dolor aplique compresas fras durante 10 a 15 minutos cada hora. Consulte con su mdico si puede aplicar compresas calientes.  Haga ejercicios tal como le indic el mdico.  Tome una ducha tibia si siente rigidez por la [place].  [employer] developer, haga movimientos con la articulacin que le duele. SOLICITE AYUDA DE INMEDIATO SI:   Tiene fiebre.  Tiene dolor, hinchazn o irritacin muy intensos.  Hay varias articulaciones que le duelen o estn hinchadas.  No mejora con el tratamiento.  Siente dolor muy intenso en la espalda o debilidad en las piernas.  No puede controlar su materia fecal (movimientos intestinales) o el pis orina.  No se siente mejor o empeora luego de 24 horas.  Los [employer] causan [facility]. ASEGRESE DE QUE:   Comprende estas instrucciones.  Controlar su enfermedad.  Solicitar ayuda de inmediato si no mejora o si empeora. Document Released: 01/19/2012 [facility] Patient Information 2015 [place]. This information is not intended to replace advice given to you by your health care provider. Make sure you  discuss any questions you have with your health care provider.

## 2015-03-14 NOTE — ED Provider Notes (Signed)
CSN: [id number]     Arrival date & time 03/13/15  2245 History   First MD Initiated Contact with Patient 03/14/15 [phone]     Chief Complaint  Patient presents with  . Knee Pain     (Consider location/radiation/quality/duration/timing/severity/associated sxs/prior Treatment) HPI Comments: Patient with a history of DM, HTN presents with pain in the right knee that has been steadily progressive for the past 4 months. Since yesterday, the pain has been sharply increased, and she reports being unable to bear weight. No fever, redness. She reports less intense pain in multiple joints inlcuding wrists, distal finger joints, shoulders and hips. No history of gout.   Patient is a 64 y.o. female presenting with knee pain. The history is provided by the patient and the spouse.  Knee Pain Location:  Knee Injury: no   Associated symptoms: no fever     Past Medical History  Diagnosis Date  . Hypertension 2006  . Diabetes mellitus 06/2011    Dr. Kathlene November  . Diverticulitis     Per patient   . Breast cancer 01/2009    left brest- status post mastectomy, intolerant to Arimidex, declined tamoxfien  . Gastric ulcer     CLO (-) per EGD ~ 11-2011   Past Surgical History  Procedure Laterality Date  . Mastectomy Left 04/2009  . Colonoscopy w/ polypectomy  11/2011   Family History  Problem Relation Age of Onset  . Heart attack Father 51    F age 44, M age 39s  . Heart disease Father   . Stroke Neg Hx   . Colon cancer Neg Hx   . Breast cancer Neg Hx   . Diabetes Sister   . Diabetes Brother   . Diabetes Maternal Aunt   . Diabetes Maternal Uncle   . Diabetes Paternal Aunt   . Diabetes Paternal Uncle   . Diabetes Maternal Grandmother   . Diabetes Maternal Grandfather   . Diabetes Paternal Grandmother   . Diabetes Paternal Grandfather   . Diabetes Sister   . Heart disease Mother    Social History  Substance Use Topics  . Smoking status: Former Smoker    Quit date: 02/17/2007  . Smokeless  tobacco: Never Used  . Alcohol Use: No   OB History    Gravida Para Term Preterm AB TAB SAB Ectopic Multiple Living   4 3 3  1  1   3      Review of Systems  Constitutional: Negative for fever and chills.  Musculoskeletal:       See HPI.  Skin: Negative.  Negative for color change.  Neurological: Negative.  Negative for numbness.      Allergies  Review of patient's allergies indicates no known allergies.  Home Medications   Prior to Admission medications   Medication Sig Start Date End Date Taking? Authorizing Provider  acetaminophen (TYLENOL) 500 MG tablet Take 1,000 mg by mouth every 6 (six) hours as needed. For pain   Yes Historical Provider, MD  cholecalciferol (VITAMIN D) 1000 UNITS tablet Take 1,000 Units by mouth daily.   Yes Historical Provider, MD  cyclobenzaprine (FLEXERIL) 10 MG tablet Take 10 mg by mouth 2 (two) times daily.   Yes Historical Provider, MD  fish oil-omega-3 fatty acids 1000 MG capsule Take 2 g by mouth daily.   Yes Historical Provider, MD  lisinopril-hydrochlorothiazide (PRINZIDE,ZESTORETIC) 20-25 MG per tablet Take 1 tablet by mouth daily.   Yes Historical Provider, MD  metoprolol (LOPRESSOR) 50 MG tablet  Take 50 mg by mouth daily.  11/08/12  Yes Colon Branch, MD  naproxen (NAPROSYN) 500 MG tablet Take 500 mg by mouth 2 (two) times daily with a meal.   Yes Historical Provider, MD  hydrochlorothiazide (HYDRODIURIL) 25 MG tablet Take 1 tablet (25 mg total) by mouth daily. Patient not taking: Reported on 03/14/2015 11/08/12   Colon Branch, MD  lisinopril (PRINIVIL,ZESTRIL) 20 MG tablet Take 1 tablet (20 mg total) by mouth daily. Patient not taking: Reported on 03/14/2015 11/08/12   Colon Branch, MD   BP 119/56 mmHg  Pulse 62  Temp(Src) 97.8 F (36.6 C) (Oral)  Resp 14  Ht 5\' 7"  (1.702 m)  Wt 160 lb (72.576 kg)  BMI 25.05 kg/m2  SpO2 100% Physical Exam  Constitutional: She is oriented to person, place, and time. She appears well-developed and well-nourished.   Neck: Normal range of motion.  Pulmonary/Chest: Effort normal.  Abdominal: There is no tenderness.  Musculoskeletal: Normal range of motion. She exhibits no edema.  Right knee has no significant swelling or discoloration. Slightly warm to touch but comparable to left. No calf or thigh tenderness. Distal pulses intact.   Neurological: She is alert and oriented to person, place, and time. Coordination normal.  Skin: Skin is warm and dry.  Psychiatric: She has a normal mood and affect.    ED Course  Procedures (including critical care time) Labs Review Labs Reviewed - No data to display  Imaging Review Dg Knee Complete 4 Views Right  03/14/2015   CLINICAL DATA:  Pain for 2 days.  No recent trauma.  EXAM: RIGHT KNEE - COMPLETE 4+ VIEW  COMPARISON:  September 27, 2013  FINDINGS: Frontal, bilateral oblique, and lateral views obtained. There is no demonstrable fracture or dislocation. No effusion. There is mild generalized joint space narrowing. No erosive change.  IMPRESSION: Mild generalized joint space narrowing. No fracture or dislocation. No effusion.   Electronically Signed   By: Lowella Grip III M.D.   On: 03/14/2015 00:54     EKG Interpretation None      MDM   Final diagnoses:  None    1. Arthritis  No new injury. She has progressive pain over time and has multiple joints that cause persistent pain. Suspect arthritis as cause of the pain. She has a family history of rheumatoid arthritis but has not been tested. She is afebrile.  Pain is somewhat improved with Mobic. Will discharge home with daily Mobic, ortho referral and prn pain medication. Patient and husband are agreeable with care plan.   Charlann Lange, PA-C 03/14/15 2952  Rolland Porter, MD 03/14/15 [phone]

## 2015-09-14 ENCOUNTER — Encounter (HOSPITAL_COMMUNITY): Payer: Self-pay | Admitting: Emergency Medicine

## 2015-09-14 ENCOUNTER — Emergency Department (HOSPITAL_COMMUNITY): Payer: Medicaid Other

## 2015-09-14 ENCOUNTER — Emergency Department (HOSPITAL_COMMUNITY)
Admission: EM | Admit: 2015-09-14 | Discharge: 2015-09-15 | Disposition: A | Discharge status: Home / Self Care | Payer: Medicaid Other | Attending: Emergency Medicine | Admitting: Emergency Medicine

## 2015-09-14 DIAGNOSIS — R05 Cough: Secondary | ICD-10-CM | POA: Diagnosis present

## 2015-09-14 DIAGNOSIS — R110 Nausea: Secondary | ICD-10-CM | POA: Diagnosis not present

## 2015-09-14 DIAGNOSIS — Z853 Personal history of malignant neoplasm of breast: Secondary | ICD-10-CM | POA: Insufficient documentation

## 2015-09-14 DIAGNOSIS — R51 Headache: Secondary | ICD-10-CM | POA: Diagnosis not present

## 2015-09-14 DIAGNOSIS — M549 Dorsalgia, unspecified: Secondary | ICD-10-CM | POA: Insufficient documentation

## 2015-09-14 DIAGNOSIS — E119 Type 2 diabetes mellitus without complications: Secondary | ICD-10-CM | POA: Diagnosis not present

## 2015-09-14 DIAGNOSIS — R6889 Other general symptoms and signs: Secondary | ICD-10-CM

## 2015-09-14 DIAGNOSIS — Z87891 Personal history of nicotine dependence: Secondary | ICD-10-CM | POA: Diagnosis not present

## 2015-09-14 DIAGNOSIS — Z79899 Other long term (current) drug therapy: Secondary | ICD-10-CM | POA: Diagnosis not present

## 2015-09-14 DIAGNOSIS — M542 Cervicalgia: Secondary | ICD-10-CM | POA: Diagnosis not present

## 2015-09-14 DIAGNOSIS — Z8719 Personal history of other diseases of the digestive system: Secondary | ICD-10-CM | POA: Insufficient documentation

## 2015-09-14 DIAGNOSIS — Z791 Long term (current) use of non-steroidal anti-inflammatories (NSAID): Secondary | ICD-10-CM | POA: Insufficient documentation

## 2015-09-14 DIAGNOSIS — R509 Fever, unspecified: Secondary | ICD-10-CM | POA: Diagnosis not present

## 2015-09-14 DIAGNOSIS — I10 Essential (primary) hypertension: Secondary | ICD-10-CM | POA: Insufficient documentation

## 2015-09-14 MED ORDER — ACETAMINOPHEN 325 MG PO TABS
650.0000 mg | ORAL_TABLET | Freq: Once | ORAL | Status: AC | PRN
  Administered 2015-09-14: 650 mg via ORAL
  Filled 2015-09-14: qty 2

## 2015-09-14 NOTE — ED Notes (Signed)
Patient presents for fever (101), HA, cough, generalized body aches, nausea x1 day.

## 2015-09-14 NOTE — ED Provider Notes (Addendum)
CSN: [id number]     Arrival date & time 09/14/15  2140 History  By signing my name below, I, Starleen Arms, attest that this documentation has been prepared under the direction and in the presence of [employer], PA-C. Electronically Signed: Starleen Arms ED Scribe. 09/14/2015. 11:01 PM.    Chief Complaint  Patient presents with  . Cough  . Generalized Body Aches   The history is provided by the patient. No language interpreter was used.   HPI Comments: Jillian Chandler is a 65 y.o. female who presents to the Emergency Department complaining of a fever onset two days ago.   Associated symptoms include cough, anterior neck pain, bilateral CP worse with cough, back pain worse with cough, headache, nausea, and generalized body aches.  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  He does not recall a dx.  Patient did not received a flu shot this year.  Patient denies vomiting.    The patient's son served as Optometrist.  Past Medical History  Diagnosis Date  . Hypertension 2006  . Diabetes mellitus 06/2011    Dr. Kathlene November  . Diverticulitis     Per patient   . Breast cancer ([place]) 01/2009    left brest- status post mastectomy, intolerant to Arimidex, declined tamoxfien  . Gastric ulcer     CLO (-) per EGD ~ 11-2011   Past Surgical History  Procedure Laterality Date  . Mastectomy Left 04/2009  . Colonoscopy w/ polypectomy  11/2011   Family History  Problem Relation Age of Onset  . Heart attack Father 38    F age 59, M age 26s  . Heart disease Father   . Stroke Neg Hx   . Colon cancer Neg Hx   . Breast cancer Neg Hx   . Diabetes Sister   . Diabetes Brother   . Diabetes Maternal Aunt   . Diabetes Maternal Uncle   . Diabetes Paternal Aunt   . Diabetes Paternal Uncle   . Diabetes Maternal Grandmother   . Diabetes Maternal Grandfather   . Diabetes Paternal  Grandmother   . Diabetes Paternal Grandfather   . Diabetes Sister   . Heart disease Mother    Social History  Substance Use Topics  . Smoking status: Former Smoker    Quit date: 02/17/2007  . Smokeless tobacco: Never Used  . Alcohol Use: No   OB History    Gravida Para Term Preterm AB TAB SAB Ectopic Multiple Living   4 3 3  1  1   3       Review of Systems  Gastrointestinal: Positive for nausea. Negative for vomiting.  Musculoskeletal: Positive for back pain.    Allergies  Review of patient's allergies indicates no known allergies.  Home Medications   Prior to Admission medications   Medication Sig Start Date End Date Taking? Authorizing Provider  acetaminophen (TYLENOL) 500 MG tablet Take 1 tablet (500 mg total) by mouth every 6 (six) hours as needed. 09/15/15   Antonietta Breach, PA-C  benzonatate (TESSALON) 100 MG capsule Take 1 capsule (100 mg total) by mouth every 8 (eight) hours. 09/15/15   Antonietta Breach, PA-C  cholecalciferol (VITAMIN D) 1000 UNITS tablet Take 1,000 Units by mouth daily.    Historical Provider, MD  cyclobenzaprine (FLEXERIL) 10 MG tablet Take 10 mg by mouth 2 (two) times daily.  Historical Provider, MD  fish oil-omega-3 fatty acids 1000 MG capsule Take 2 g by mouth daily.    Historical Provider, MD  hydrochlorothiazide (HYDRODIURIL) 25 MG tablet Take 1 tablet (25 mg total) by mouth daily. Patient not taking: Reported on 03/14/2015 11/08/12   Colon Branch, MD  HYDROcodone-acetaminophen (NORCO/VICODIN) 5-325 MG per tablet Take 1-2 tablets by mouth every 4 (four) hours as needed. 03/14/15   Charlann Lange, PA-C  lisinopril (PRINIVIL,ZESTRIL) 20 MG tablet Take 1 tablet (20 mg total) by mouth daily. Patient not taking: Reported on 03/14/2015 11/08/12   Colon Branch, MD  lisinopril-hydrochlorothiazide (PRINZIDE,ZESTORETIC) 20-25 MG per tablet Take 1 tablet by mouth daily.    Historical Provider, MD  meloxicam (MOBIC) 7.5 MG tablet Take 1 tablet (7.5 mg total) by mouth daily.  03/14/15   Charlann Lange, PA-C  metoprolol (LOPRESSOR) 50 MG tablet Take 50 mg by mouth daily.  11/08/12   Colon Branch, MD  naproxen (NAPROSYN) 500 MG tablet Take 500 mg by mouth 2 (two) times daily with a meal.    Historical Provider, MD   BP 153/85 mmHg  Pulse 98  Temp(Src) 100.4 F (38 C) (Oral)  Resp 18  SpO2 97%   Physical Exam  Constitutional: She is oriented to person, place, and time. She appears well-developed and well-nourished. No distress.  Nontoxic/nonseptic appearing  HENT:  Head: Normocephalic and atraumatic.  Right Ear: Tympanic membrane, external ear and ear canal normal.  Left Ear: Tympanic membrane, external ear and ear canal normal.  Mouth/Throat: Uvula is midline, oropharynx is clear and moist and mucous membranes are normal.  Eyes: Conjunctivae and EOM are normal. No scleral icterus.  Neck: Normal range of motion.  Cardiovascular: Normal rate, regular rhythm and intact distal pulses.   Pulmonary/Chest: Effort normal and breath sounds normal. No respiratory distress. She has no wheezes. She has no rales.  Respirations even and unlabored.  Musculoskeletal: Normal range of motion.  Neurological: She is alert and oriented to person, place, and time. She exhibits normal muscle tone. Coordination normal.  Ambulatory with steady gait.  Skin: Skin is warm and dry. No rash noted. She is not diaphoretic. No erythema. No pallor.  Psychiatric: She has a normal mood and affect. Her behavior is normal.  Nursing note and vitals reviewed.   ED Course  Procedures (including critical care time)  DIAGNOSTIC STUDIES: Oxygen Saturation is 97% on RA, normal by my interpretation.    COORDINATION OF CARE:  11:06 PM Will order CXR to r/o PNA.  Patient acknowledges and agrees with plan.    Labs Review Labs Reviewed - No data to display  Imaging Review Dg Chest 2 View  09/14/2015  CLINICAL DATA:  Acute onset of fever, cough, anterior neck pain and bilateral chest pain. Back  pain. Headache and nausea. Initial encounter. EXAM: CHEST  2 VIEW COMPARISON:  Chest radiograph from 07/05/2014 FINDINGS: The lungs are well-aerated and clear. There is no evidence of focal opacification, pleural effusion or pneumothorax. The heart is normal in size; the mediastinal contour is within normal limits. No acute osseous abnormalities are seen. IMPRESSION: No acute cardiopulmonary process seen. Electronically Signed   By: Garald Balding M.D.   On: 09/14/2015 23:42   I have personally reviewed and evaluated these images and lab results as part of my medical decision-making.   EKG Interpretation None      MDM   Final diagnoses:  Flu-like symptoms    Patient with symptoms consistent with influenza. Vitals  are stable, low-grade fever. No signs of dehydration, tolerating PO's. Lungs are clear. CXR negative for PNA. Patient will be discharged with instructions to orally hydrate, rest, and use over-the-counter medications such as Tylenol for fever. Tessalon prescribed for cough. Return precautions given at discharge. Patient discharged in satisfactory condition.  I personally performed the services described in this documentation, which was scribed in my presence. The recorded information has been reviewed and is accurate.    Filed Vitals:   09/14/15 2148  BP: 153/85  Pulse: 98  Temp: 100.4 F (38 C)  TempSrc: Oral  Resp: 18  SpO2: 97%     Antonietta Breach, PA-C 09/15/15 0043  Leo Grosser, MD 09/15/15 0327  Antonietta Breach, PA-C 10/11/15 [id number]  Leo Grosser, MD 10/21/15 [id number]

## 2015-09-14 NOTE — ED Notes (Signed)
Patient transported to X-ray 

## 2015-09-15 MED ORDER — BENZONATATE 100 MG PO CAPS: 100.0000 mg | ORAL_CAPSULE | Freq: Three times a day (TID) | ORAL | Status: DC

## 2015-09-15 MED ORDER — ACETAMINOPHEN 500 MG PO TABS: 500.0000 mg | ORAL_TABLET | Freq: Four times a day (QID) | ORAL | Status: DC | PRN

## 2015-09-15 NOTE — ED Notes (Signed)
Upon entering the room patient and family are no longer present. Discharge paper work has not been given .

## 2015-09-15 NOTE — Discharge Instructions (Signed)

## 2015-09-15 NOTE — ED Notes (Signed)
MD at bedside. 

## 2016-04-17 ENCOUNTER — Ambulatory Visit (HOSPITAL_COMMUNITY)
Admission: EM | Admit: 2016-04-17 | Discharge: 2016-04-17 | Disposition: A | Discharge status: Home / Self Care | Payer: Medicaid Other | Attending: Family Medicine | Admitting: Family Medicine

## 2016-04-17 ENCOUNTER — Encounter (HOSPITAL_COMMUNITY): Payer: Self-pay | Admitting: Emergency Medicine

## 2016-04-17 DIAGNOSIS — I10 Essential (primary) hypertension: Secondary | ICD-10-CM | POA: Diagnosis not present

## 2016-04-17 DIAGNOSIS — Z760 Encounter for issue of repeat prescription: Secondary | ICD-10-CM

## 2016-04-17 DIAGNOSIS — M542 Cervicalgia: Secondary | ICD-10-CM | POA: Diagnosis not present

## 2016-04-17 DIAGNOSIS — M170 Bilateral primary osteoarthritis of knee: Secondary | ICD-10-CM | POA: Diagnosis not present

## 2016-04-17 MED ORDER — METOPROLOL SUCCINATE ER 50 MG PO TB24: 50.0000 mg | ORAL_TABLET | Freq: Every day | ORAL | 0 refills | Status: DC

## 2016-04-17 MED ORDER — LISINOPRIL-HYDROCHLOROTHIAZIDE 20-25 MG PO TABS: 1.0000 | ORAL_TABLET | Freq: Every day | ORAL | 0 refills | Status: DC

## 2016-04-17 NOTE — Discharge Instructions (Signed)
Heat to your neck and knees Meds as directed. Must see your doctor. There is a risk of taking medications prescribed by a health care provider without obtaining a complete history, labwork or proper physical exam, etc. This cannot be completed adequately at an urgent care. There can be multiple problems, some serious,  associated with medications and undetermined conditions of your health status. This action is performed as a last resort in order to supply you with medication. By receiving these prescriptions you are ackowleging and accepting these risks and will not hold the prescriber or any agent of [facility] as responsible for any adverse outcomes.  It was recommended that you go to the emergency Department for additional evaluation that is not available in the urgent care. The reason for your retaining fluid and shortness of breath is unknown at this time. Possibilities include heart problems, heart attack, problems with her kidneys, blood clotting along or other conditions that may result in death or disability. Your decision was to have the minimum lab work performed here and receive a prescription for hydrochlorothiazide if no contraindication was found.

## 2016-04-17 NOTE — ED Provider Notes (Signed)
CSN: [id number]     Arrival date & time 04/17/16  1813 History   First MD Initiated Contact with Patient 04/17/16 2053     Chief Complaint  Patient presents with  . Headache  . Knee Pain  . Medication Refill   (Consider location/radiation/quality/duration/timing/severity/associated sxs/prior Treatment) 65 year old female states she is currently without any of her PCP due to language barrier and other poorly described problems. She is complaining of acute on chronic pain in the back of the head and muscles. This has occurred before. Pain is worse with movement and turning of the head. She is also complaining of chronic knee pain. She has chronic osteoarthritis. No recent injury or trauma. She is requesting to up her medications be refilled until she sees her doctor next month. She has hypertension and is requesting metoprolol and lisinopril HCTZ be refilled.      Past Medical History:  Diagnosis Date  . Breast cancer ([place]) 01/2009   left brest- status post mastectomy, intolerant to Arimidex, declined tamoxfien  . Diabetes mellitus 06/2011   Dr. Kathlene November  . Diverticulitis    Per patient   . Gastric ulcer    CLO (-) per EGD ~ 11-2011  . Hypertension 2006   Past Surgical History:  Procedure Laterality Date  . COLONOSCOPY W/ POLYPECTOMY  11/2011  . MASTECTOMY Left 04/2009   Family History  Problem Relation Age of Onset  . Heart attack Father 60    F age 42, M age 70s  . Heart disease Father   . Diabetes Sister   . Diabetes Brother   . Diabetes Sister   . Heart disease Mother   . Diabetes Maternal Aunt   . Diabetes Maternal Uncle   . Diabetes Paternal Aunt   . Diabetes Paternal Uncle   . Diabetes Maternal Grandmother   . Diabetes Maternal Grandfather   . Diabetes Paternal Grandmother   . Diabetes Paternal Grandfather   . Stroke Neg Hx   . Colon cancer Neg Hx   . Breast cancer Neg Hx    Social History  Substance Use Topics  . Smoking status: Former Smoker    Quit date:  02/17/2007  . Smokeless tobacco: Never Used  . Alcohol use No   OB History    Gravida Para Term Preterm AB Living   4 3 3   1 3    SAB TAB Ectopic Multiple Live Births   1       3     Review of Systems  Constitutional: Negative.   HENT: Negative.   Eyes: Negative.   Respiratory: Negative.  Negative for cough and shortness of breath.   Cardiovascular: Negative for chest pain.  Gastrointestinal: Negative.   Genitourinary: Negative.   Musculoskeletal: Positive for arthralgias and myalgias.  Skin: Negative.   Neurological: Negative.   All other systems reviewed and are negative.   Allergies  Review of patient's allergies indicates no known allergies.  Home Medications   Prior to Admission medications   Medication Sig Start Date End Date Taking? Authorizing Provider  acetaminophen (TYLENOL) 500 MG tablet Take 1 tablet (500 mg total) by mouth every 6 (six) hours as needed. 09/15/15   Antonietta Breach, PA-C  benzonatate (TESSALON) 100 MG capsule Take 1 capsule (100 mg total) by mouth every 8 (eight) hours. 09/15/15   Antonietta Breach, PA-C  cholecalciferol (VITAMIN D) 1000 UNITS tablet Take 1,000 Units by mouth daily.    Historical Provider, MD  cyclobenzaprine (FLEXERIL) 10 MG tablet Take  10 mg by mouth 2 (two) times daily.    Historical Provider, MD  fish oil-omega-3 fatty acids 1000 MG capsule Take 2 g by mouth daily.    Historical Provider, MD  naproxen (NAPROSYN) 500 MG tablet Take 500 mg by mouth 2 (two) times daily with a meal.    Historical Provider, MD   Meds Ordered and Administered this Visit  Medications - No data to display  BP 119/59 (BP Location: Left Arm)   Pulse 65   Temp 98.6 F (37 C) (Oral)   Resp 16   SpO2 100%  No data found.   Physical Exam  Constitutional: She is oriented to person, place, and time. She appears well-developed and well-nourished.  HENT:  Head: Normocephalic and atraumatic.  Eyes: EOM are normal.  Neck: Normal range of motion. Neck supple.   Tenderness to the paracervical musculature particularly the trapezii. Full range of motion.  Cardiovascular: Normal rate, regular rhythm, normal heart sounds and intact distal pulses.   Pulmonary/Chest: Effort normal and breath sounds normal. No respiratory distress.  Musculoskeletal: Normal range of motion. She exhibits no edema.  Bilateral knees knotty in appearance and palpation. No swelling, no discoloration, no erythema no increase in warmth. Flexion and extension are intact.  Neurological: She is alert and oriented to person, place, and time.  Skin: Skin is warm and dry. Capillary refill takes less than 2 seconds.  Psychiatric: She has a normal mood and affect.  Nursing note and vitals reviewed.   Urgent Care Course   Clinical Course    Procedures (including critical care time)  Labs Review Labs Reviewed - No data to display  Imaging Review No results found.   Visual Acuity Review  Right Eye Distance:   Left Eye Distance:   Bilateral Distance:    Right Eye Near:   Left Eye Near:    Bilateral Near:         MDM   1. Essential hypertension   2. Primary osteoarthritis of both knees   3. Medication refill   4. Muscle pain, cervical     Heat to your neck and knees Meds as directed. Must see your doctor. There is a risk of taking medications prescribed by a health care provider without obtaining a complete history, labwork or proper physical exam, etc. This cannot be completed adequately at an urgent care. There can be multiple problems, some serious,  associated with medications and undetermined conditions of your health status. This action is performed as a last resort in order to supply you with medication. By receiving these prescriptions you are ackowleging and accepting these risks and will not hold the prescriber or any agent of [facility] as responsible for any adverse outcomes.  It was recommended that you go to the  emergency Department for additional evaluation that is not available in the urgent care. The reason for your retaining fluid and shortness of breath is unknown at this time. Possibilities include heart problems, heart attack, problems with her kidneys, blood clotting along or other conditions that may result in death or disability. Your decision was to have the minimum lab work performed here and receive a prescription for hydrochlorothiazide if no contraindication was found.  Meds ordered this encounter  Medications  . metoprolol succinate (TOPROL XL) 50 MG 24 hr tablet    Sig: Take 1 tablet (50 mg total) by mouth daily. Take with or immediately following a meal.    Dispense:  20 tablet    Refill:  0    Order Specific Question:   Supervising Provider    Answer:   Billy Fischer [phone]  . lisinopril-hydrochlorothiazide (PRINZIDE,ZESTORETIC) 20-25 MG tablet    Sig: Take 1 tablet by mouth daily.    Dispense:  20 tablet    Refill:  0    Order Specific Question:   Supervising Provider    Answer:   Billy Fischer [[id number]]      Janne Napoleon, NP 04/17/16 2110    Janne Napoleon, NP 04/17/16 2115

## 2016-04-17 NOTE — ED Triage Notes (Signed)
C/o pain on back of head/neck onset this morning... Pain increases w/activity  Also c/o bilateral knee pain... Hx of arthritis... Denies inj/trauma  Needing refill on BP meds... She is in the process of switching PCP  A&O x4... NAD

## 2016-05-02 ENCOUNTER — Encounter (HOSPITAL_COMMUNITY): Payer: Self-pay | Admitting: Physical Medicine and Rehabilitation

## 2016-05-02 ENCOUNTER — Emergency Department (HOSPITAL_COMMUNITY)
Admission: EM | Admit: 2016-05-02 | Discharge: 2016-05-02 | Disposition: A | Discharge status: Home / Self Care | Payer: Medicaid Other | Attending: Emergency Medicine | Admitting: Emergency Medicine

## 2016-05-02 ENCOUNTER — Emergency Department (HOSPITAL_COMMUNITY): Payer: Medicaid Other

## 2016-05-02 DIAGNOSIS — R0789 Other chest pain: Secondary | ICD-10-CM | POA: Diagnosis not present

## 2016-05-02 DIAGNOSIS — I10 Essential (primary) hypertension: Secondary | ICD-10-CM | POA: Diagnosis not present

## 2016-05-02 DIAGNOSIS — Z87891 Personal history of nicotine dependence: Secondary | ICD-10-CM | POA: Insufficient documentation

## 2016-05-02 DIAGNOSIS — E119 Type 2 diabetes mellitus without complications: Secondary | ICD-10-CM | POA: Insufficient documentation

## 2016-05-02 DIAGNOSIS — Z853 Personal history of malignant neoplasm of breast: Secondary | ICD-10-CM | POA: Diagnosis not present

## 2016-05-02 DIAGNOSIS — R079 Chest pain, unspecified: Secondary | ICD-10-CM | POA: Diagnosis present

## 2016-05-02 LAB — I-STAT CHEM 8, ED
BUN: 16 mg/dL (ref 6–20)
CALCIUM ION: 1.25 mmol/L (ref 1.15–1.40)
CHLORIDE: 99 mmol/L — AB (ref 101–111)
Creatinine, Ser: 0.6 mg/dL (ref 0.44–1.00)
GLUCOSE: 84 mg/dL (ref 65–99)
HCT: 47 % — ABNORMAL HIGH (ref 36.0–46.0)
Hemoglobin: 16 g/dL — ABNORMAL HIGH (ref 12.0–15.0)
Potassium: 4 mmol/L (ref 3.5–5.1)
Sodium: 141 mmol/L (ref 135–145)
TCO2: 33 mmol/L (ref 0–100)

## 2016-05-02 LAB — I-STAT TROPONIN, ED: Troponin i, poc: 0 ng/mL (ref 0.00–0.08)

## 2016-05-02 MED ORDER — KETOROLAC TROMETHAMINE 30 MG/ML IJ SOLN
15.0000 mg | Freq: Once | INTRAMUSCULAR | Status: AC
  Administered 2016-05-02: 15 mg via INTRAVENOUS
  Filled 2016-05-02: qty 1

## 2016-05-02 MED ORDER — IBUPROFEN 600 MG PO TABS: 600.0000 mg | ORAL_TABLET | Freq: Four times a day (QID) | ORAL | 0 refills | Status: DC | PRN

## 2016-05-02 NOTE — ED Provider Notes (Addendum)
[facility] DEPT Provider Note   CSN: [id number] Arrival date & time: 05/02/16  [id number]     History   Chief Complaint Chief Complaint  Patient presents with  . Chest Pain    HPI Jillian Chandler is a 65 y.o. female.  HPI  Patient presents with left-sided chest pain that began Friday.  Pain is left anterior chest and left shoulder area.  Hurts worse when she moves her or abduction or shoulder.  Patient denies shortness of breath.  Denies diaphoresis.  Has had history of chest wall pain in the past.  Also said her breasts removal because of breast cancer in the past. Past Medical History:  Diagnosis Date  . Breast cancer ([place]) 01/2009   left brest- status post mastectomy, intolerant to Arimidex, declined tamoxfien  . Gastric ulcer    CLO (-) per EGD ~ 11-2011  . Hypertension 2006    Patient Active Problem List   Diagnosis Date Noted  . Breast cancer, left breast ([place]) 10/02/2013  . Hyperlipidemia 07/26/2013  . Atypical chest pain 07/06/2013  . Headache(784.0) 10/03/2012  . Menopause 02/17/2012  . Diabetes mellitus ([place]) 01/25/2012  . PUD (peptic ulcer disease) 01/25/2012  . Diverticulitis 07/20/2011  . General medical examination 06/18/2011  . Back pain 11/25/2010  . DEGENERATIVE JOINT DISEASE 01/21/2010  . HYPERTENSION 08/12/2009  . BREAST CANCER, HX OF 08/12/2009    Past Surgical History:  Procedure Laterality Date  . COLONOSCOPY W/ POLYPECTOMY  11/2011  . MASTECTOMY Left 04/2009    OB History    Gravida Para Term Preterm AB Living   4 3 3   1 3    SAB TAB Ectopic Multiple Live Births   1       3       Home Medications    Prior to Admission medications   Medication Sig Start Date End Date Taking? Authorizing Provider  acetaminophen (TYLENOL) 500 MG tablet Take 1 tablet (500 mg total) by mouth every 6 (six) hours as needed. 09/15/15   Antonietta Breach, PA-C  benzonatate (TESSALON) 100 MG capsule Take 1 capsule (100 mg total) by mouth every 8 (eight) hours. 09/15/15    Antonietta Breach, PA-C  cholecalciferol (VITAMIN D) 1000 UNITS tablet Take 1,000 Units by mouth daily.    Historical Provider, MD  cyclobenzaprine (FLEXERIL) 10 MG tablet Take 10 mg by mouth 2 (two) times daily.    Historical Provider, MD  fish oil-omega-3 fatty acids 1000 MG capsule Take 2 g by mouth daily.    Historical Provider, MD  ibuprofen (ADVIL,MOTRIN) 600 MG tablet Take 1 tablet (600 mg total) by mouth every 6 (six) hours as needed. 05/02/16   Leonard Schwartz, MD  lisinopril-hydrochlorothiazide (PRINZIDE,ZESTORETIC) 20-25 MG tablet Take 1 tablet by mouth daily. 04/17/16   Janne Napoleon, NP  metoprolol succinate (TOPROL XL) 50 MG 24 hr tablet Take 1 tablet (50 mg total) by mouth daily. Take with or immediately following a meal. 04/17/16   Janne Napoleon, NP  naproxen (NAPROSYN) 500 MG tablet Take 500 mg by mouth 2 (two) times daily with a meal.    Historical Provider, MD    Family History Family History  Problem Relation Age of Onset  . Heart attack Father 31    F age 68, M age 32s  . Heart disease Father   . Diabetes Sister   . Diabetes Brother   . Diabetes Sister   . Heart disease Mother   . Diabetes Maternal Aunt   . Diabetes Maternal  Uncle   . Diabetes Paternal Aunt   . Diabetes Paternal Uncle   . Diabetes Maternal Grandmother   . Diabetes Maternal Grandfather   . Diabetes Paternal Grandmother   . Diabetes Paternal Grandfather   . Stroke Neg Hx   . Colon cancer Neg Hx   . Breast cancer Neg Hx     Social History Social History  Substance Use Topics  . Smoking status: Former Smoker    Quit date: 02/17/2007  . Smokeless tobacco: Never Used  . Alcohol use No     Allergies   Review of patient's allergies indicates no known allergies.   Review of Systems Review of Systems  All other systems reviewed and are negative.    Physical Exam Updated Vital Signs BP 125/75 (BP Location: Right Arm)   Pulse 65   Temp 97.8 F (36.6 C) (Oral)   Resp 18   SpO2 98%   Physical Exam    Pulmonary/Chest:    Pain is reproducible over areas where indicated.  Also increases with movement of her shoulder.  No masses noted.  Absent left breast because of mastectomy.   Physical Exam  Nursing note and vitals reviewed. Constitutional: She is oriented to person, place, and time. She appears well-developed and well-nourished. No distress.  HENT:  Head: Normocephalic and atraumatic.  Eyes: Pupils are equal, round, and reactive to light.  Neck: Normal range of motion.  Cardiovascular: Normal rate and intact distal pulses.   Pulmonary/Chest: No respiratory distress.  Abdominal: Normal appearance. She exhibits no distension.  Musculoskeletal: Normal range of motion.  Neurological: She is alert and oriented to person, place, and time. No cranial nerve deficit.  Skin: Skin is warm and dry. No rash noted.  Psychiatric: She has a normal mood and affect. Her behavior is normal.    ED Treatments / Results  Labs (all labs ordered are listed, but only abnormal results are displayed) Labs Reviewed  I-STAT CHEM 8, ED - Abnormal; Notable for the following:       Result Value   Chloride 99 (*)    Hemoglobin 16.0 (*)    HCT 47.0 (*)    All other components within normal limits  Randolm Idol, ED  I-STAT TROPOININ, ED    EKG  EKG Interpretation  Date/Time:  Saturday May 02 2016 09:22:46 EDT Ventricular Rate:  64 PR Interval:  204 QRS Duration: 98 QT Interval:  390 QTC Calculation: 402 R Axis:   52 Text Interpretation:  Normal sinus rhythm Normal ECG Confirmed by Ahni Bradwell  MD, Nazariah Cadet ([id number]) on 05/02/2016 9:28:55 AM       Radiology Dg Chest 2 View  Result Date: 05/02/2016 CLINICAL DATA:  Chest pain and shortness of breath. Limited left shoulder mobility. EXAM: CHEST  2 VIEW COMPARISON:  September 14, 2015 FINDINGS: The heart size and mediastinal contours are within normal limits. Both lungs are clear. The visualized skeletal structures are unremarkable. IMPRESSION: No  active cardiopulmonary disease. Electronically Signed   By: Dorise Bullion III M.D   On: 05/02/2016 10:20   Dg Shoulder Left  Result Date: 05/02/2016 CLINICAL DATA:  Pain after trauma EXAM: LEFT SHOULDER - 2+ VIEW COMPARISON:  None. FINDINGS: There is no evidence of fracture or dislocation. There is no evidence of arthropathy or other focal bone abnormality. Soft tissues are unremarkable. IMPRESSION: Negative. Electronically Signed   By: Dorise Bullion III M.D   On: 05/02/2016 10:21    Procedures Procedures (including critical care time)  Medications Ordered  in ED Medications  ketorolac (TORADOL) 30 MG/ML injection 15 mg (15 mg Intravenous Given 05/02/16 1033)     Initial Impression / Assessment and Plan / ED Course  I have reviewed the triage vital signs and the nursing notes.  Pertinent labs & imaging results that were available during my care of the patient were reviewed by me and considered in my medical decision making (see chart for details).  Clinical Course  After treatment in the ED the patient feels back to baseline and wants to go home.    Final Clinical Impressions(s) / ED Diagnoses   Final diagnoses:  Chest wall pain    New Prescriptions New Prescriptions   IBUPROFEN (ADVIL,MOTRIN) 600 MG TABLET    Take 1 tablet (600 mg total) by mouth every 6 (six) hours as needed.         Leonard Schwartz, MD 05/02/16 [phone]

## 2016-05-02 NOTE — ED Triage Notes (Signed)
Pt states pain increases with deep breathing. Pt does not speak english. Husband at bedside to translate.

## 2016-05-02 NOTE — ED Triage Notes (Signed)
Pt reports L sided chest pain radiating to back. Onset Friday evening. Respirations unlabored.

## 2016-05-04 ENCOUNTER — Ambulatory Visit (INDEPENDENT_AMBULATORY_CARE_PROVIDER_SITE_OTHER): Payer: Medicare Other | Admitting: Medical

## 2016-05-04 ENCOUNTER — Encounter: Payer: Self-pay | Admitting: Medical

## 2016-05-04 VITALS — BP 122/60 | HR 65 | Temp 98.3°F | Ht 67.0 in | Wt 165.6 lb

## 2016-05-04 DIAGNOSIS — M940 Chondrocostal junction syndrome [Tietze]: Secondary | ICD-10-CM

## 2016-05-04 DIAGNOSIS — R739 Hyperglycemia, unspecified: Secondary | ICD-10-CM | POA: Diagnosis not present

## 2016-05-04 DIAGNOSIS — R5383 Other fatigue: Secondary | ICD-10-CM

## 2016-05-04 DIAGNOSIS — I10 Essential (primary) hypertension: Secondary | ICD-10-CM | POA: Diagnosis not present

## 2016-05-04 DIAGNOSIS — R0789 Other chest pain: Secondary | ICD-10-CM

## 2016-05-04 DIAGNOSIS — Z23 Encounter for immunization: Secondary | ICD-10-CM

## 2016-05-04 LAB — COMPREHENSIVE METABOLIC PANEL
ALK PHOS: 75 U/L (ref 39–117)
ALT: 20 U/L (ref 0–35)
AST: 14 U/L (ref 0–37)
Albumin: 4 g/dL (ref 3.5–5.2)
BILIRUBIN TOTAL: 0.4 mg/dL (ref 0.2–1.2)
BUN: 13 mg/dL (ref 6–23)
CO2: 34 mEq/L — ABNORMAL HIGH (ref 19–32)
CREATININE: 0.48 mg/dL (ref 0.40–1.20)
Calcium: 10.1 mg/dL (ref 8.4–10.5)
Chloride: 100 mEq/L (ref 96–112)
GFR: 138.65 mL/min (ref >60.00)
Glucose, Bld: 87 mg/dL (ref 70–99)
Potassium: 4.4 mEq/L (ref 3.5–5.1)
Sodium: 139 mEq/L (ref 135–145)
Total Protein: 7.6 g/dL (ref 6.0–8.3)

## 2016-05-04 LAB — TROPONIN I: TNIDX: 0 ug/L (ref 0.00–0.06)

## 2016-05-04 LAB — TSH: TSH: 1.05 u[IU]/mL (ref 0.35–4.50)

## 2016-05-04 LAB — VITAMIN B12: Vitamin B-12: 836 pg/mL (ref 211–911)

## 2016-05-04 LAB — HEMOGLOBIN A1C: Hgb A1c MFr Bld: 5.8 % (ref 4.6–6.5)

## 2016-05-04 LAB — CBC WITH DIFFERENTIAL/PLATELET
BASOS ABS: 0 10*3/uL (ref 0.0–0.1)
Basophils Relative: 0.4 % (ref 0.0–3.0)
Eosinophils Absolute: 0.1 10*3/uL (ref 0.0–0.7)
Eosinophils Relative: 1.5 % (ref 0.0–5.0)
HCT: 44.1 % (ref 36.0–46.0)
HEMOGLOBIN: 14.9 g/dL (ref 12.0–15.0)
Lymphocytes Relative: 37.5 % (ref 12.0–46.0)
Lymphs Abs: 3 10*3/uL (ref 0.7–4.0)
MCHC: 33.8 g/dL (ref 30.0–36.0)
MCV: 84.1 fl (ref 78.0–100.0)
MONOS PCT: 7.8 % (ref 3.0–12.0)
Monocytes Absolute: 0.6 10*3/uL (ref 0.1–1.0)
Neutro Abs: 4.3 10*3/uL (ref 1.4–7.7)
Neutrophils Relative %: 52.8 % (ref 43.0–77.0)
Platelets: 305 10*3/uL (ref 150.0–400.0)
RBC: 5.25 Mil/uL — AB (ref 3.87–5.11)
RDW: 13.4 % (ref 11.5–15.5)
WBC: 8.1 10*3/uL (ref 4.0–10.5)

## 2016-05-04 MED ORDER — METOPROLOL SUCCINATE ER 50 MG PO TB24: 50.0000 mg | ORAL_TABLET | Freq: Every day | ORAL | 3 refills | Status: DC

## 2016-05-04 MED ORDER — LISINOPRIL-HYDROCHLOROTHIAZIDE 20-25 MG PO TABS: 1.0000 | ORAL_TABLET | Freq: Every day | ORAL | 3 refills | Status: DC

## 2016-05-04 MED ORDER — KETOROLAC TROMETHAMINE 60 MG/2ML IM SOLN
60.0000 mg | Freq: Once | INTRAMUSCULAR | Status: AC
  Administered 2016-05-04: 60 mg via INTRAMUSCULAR

## 2016-05-04 NOTE — Progress Notes (Signed)
Pre visit review using our clinic tool,if applicable. No additional management support is needed unless otherwise documented below in the visit note.  

## 2016-05-04 NOTE — Progress Notes (Signed)
Subjective:    Patient ID: Jillian Chandler, female    DOB: 1950/10/09, 65 y.o.   MRN: [id number]  HPI   I have reviewed pt PMH, PSH, FH, Social History and Surgical History.  Pt not here for 3 years.  States had been going to [facility].  Pt initially from [place] years ago.   Ptin with some recent left side chest pain and some back pain. She was worked up in the ED. Troponin was negative. Xray of chest was normal. Pt was told to take motrin. She tried tylenol instead. Pt has history of gastric ulcer more than 8 years ago.   Pt pain in chest wall is worse on breathing deep. And worse on twisting thorax. Increased on lifting left arm. Last night pain was severe. But no jaw pain, No sob, no diaphoresis. No abdomen pain. No shoulder pain.  Pt in past used meloxicam for some back pain. But used one firiday 7.5 mg other day and did not help her chest wall pain. Then she went to ED.  Pt has hx of breast CA. Operation mastectomy 7 years ago.  Pt has hx of htn- She is taking metoprolol and lisinpril /hctz. BP is controlled.  Pt does get mammograms every year.   Pt cholesterol has been elevated in the past. Bu not on meds.  Pt has not diabetes history per her report. But states on time hyperglycemia at sugar level 146.  No leg pain reported.  Pt also reports some occasional mild fatigue and wants to know if her b12 is low.       Review of Systems  Constitutional: Positive for fatigue.  HENT: Negative for congestion, ear pain, sneezing and sore throat.   Respiratory: Negative for apnea, cough, chest tightness, shortness of breath and wheezing.   Cardiovascular: Positive for chest pain. Negative for palpitations and leg swelling.  Gastrointestinal: Negative for abdominal pain, blood in stool, constipation, diarrhea, nausea and vomiting.  Musculoskeletal: Negative for back pain, gait problem and neck stiffness.  Neurological: Negative for dizziness.  Hematological:  Negative for adenopathy. Does not bruise/bleed easily.   Past Medical History:  Diagnosis Date  . Breast cancer ([place]) 01/2009   left brest- status post mastectomy, intolerant to Arimidex, declined tamoxfien  . Gastric ulcer    CLO (-) per EGD ~ 11-2011  . Hyperlipidemia   . Hypertension 2006     Social History   Social History  . Marital status: Married    Spouse name: N/A  . Number of children: 4  . Years of education: N/A   Occupational History  . [employer] and spatial designer   Social History Main Topics  . Smoking status: Former Smoker    Quit date: 02/17/2007  . Smokeless tobacco: Never Used  . Alcohol use No  . Drug use: No  . Sexual activity: Not on file   Other Topics Concern  . Not on file   Social History Narrative   Original from [place] ---   Diet: very  healthy   Exercise- not frequently but active at work                Past Surgical History:  Procedure Laterality Date  . COLONOSCOPY W/ POLYPECTOMY  11/2011  . MASTECTOMY Left 04/2009    Family History  Problem Relation Age of Onset  . Heart attack Father 69    F age 5, M age 7s  . Heart disease Father   .  Diabetes Sister   . Diabetes Brother   . Diabetes Sister   . Heart disease Mother   . Diabetes Maternal Aunt   . Diabetes Maternal Uncle   . Diabetes Paternal Aunt   . Diabetes Paternal Uncle   . Diabetes Maternal Grandmother   . Diabetes Maternal Grandfather   . Diabetes Paternal Grandmother   . Diabetes Paternal Grandfather   . Stroke Neg Hx   . Colon cancer Neg Hx   . Breast cancer Neg Hx     No Known Allergies  Current Outpatient Prescriptions on File Prior to Visit  Medication Sig Dispense Refill  . acetaminophen (TYLENOL) 500 MG tablet Take 1 tablet (500 mg total) by mouth every 6 (six) hours as needed. 30 tablet 0  . benzonatate (TESSALON) 100 MG capsule Take 1 capsule (100 mg total) by mouth every 8 (eight) hours. 21 capsule 0  . cholecalciferol (VITAMIN D) 1000  UNITS tablet Take 1,000 Units by mouth daily.    . cyclobenzaprine (FLEXERIL) 10 MG tablet Take 10 mg by mouth 2 (two) times daily.    . fish oil-omega-3 fatty acids 1000 MG capsule Take 2 g by mouth daily.    Marland Kitchen ibuprofen (ADVIL,MOTRIN) 600 MG tablet Take 1 tablet (600 mg total) by mouth every 6 (six) hours as needed. 30 tablet 0  . lisinopril-hydrochlorothiazide (PRINZIDE,ZESTORETIC) 20-25 MG tablet Take 1 tablet by mouth daily. 20 tablet 0  . metoprolol succinate (TOPROL XL) 50 MG 24 hr tablet Take 1 tablet (50 mg total) by mouth daily. Take with or immediately following a meal. 20 tablet 0  . naproxen (NAPROSYN) 500 MG tablet Take 500 mg by mouth 2 (two) times daily with a meal.     No current facility-administered medications on file prior to visit.     BP 122/60   Pulse 65   Temp 98.3 F (36.8 C) (Oral)   Ht 5\' 7"  (1.702 m)   Wt 165 lb 9.6 oz (75.1 kg)   SpO2 98%   BMI 25.94 kg/m       Objective:   Physical Exam  General Mental Status- Alert. General Appearance- Not in acute distress.   Skin General: Color- Normal Color. Moisture- Normal Moisture.  Neck Carotid Arteries- Normal color. Moisture- Normal Moisture. No carotid bruits. No JVD.  Chest and Lung Exam Auscultation: Breath Sounds:-Normal.  Cardiovascular Auscultation:Rythm- Regular. Murmurs & Other Heart Sounds:Auscultation of the heart reveals- No Murmurs.  Abdomen Inspection:-Inspeection Normal. Palpation/Percussion:Note:No mass. Palpation and Percussion of the abdomen reveal- Non Tender, Non Distended + BS, no rebound or guarding.    Neurologic Cranial Nerve exam:- CN III-XII intact(No nystagmus), symmetric smile. Strength:- 5/5 equal and symmetric strength both upper and lower extremities.  Chest wall- left side shows prior mastectomy. Has scar present. Skin normal no rash or vesicles. Costochondral junction tender to palpation. On palpation pectoralis muscle is tender. Range of motion left upper  ext increases chest wall pain.  Lower ext- no pedal edema. Negative homans signs.       Assessment & Plan:  Your chest wall pain does appear to be a combination of muscle and cartlidge pain. We gave you toradol 60 mg im in office. You can take 1-2 tabs your meloxicam 7.5 mg tabs a day(but start tomorrow). Make sure you eat something before taking the tablets. If tablets upset your stomach then stop taking.  Ekg looked normal. For safety/caution sake troponin stat included today.  For fatigue will get cbc, cmp, tsh and b12  level.  For htn will refill you bp meds.  We gave you flu vaccine today.  For your history of sugar high around 140 level will get a1c today.(3 month blood sugar average test.)  Follow up in 10 days or as needed.(on follow up schedule for early morning. And please come in fasting so we can check your cholesterol.  If you get at any point severe chest pain after hours then still advise ED evaluation. If pain were to persist might need to refer you to cardiologist due to risk factors.  Josue Kass, Percell Miller, PA-C

## 2016-05-04 NOTE — Patient Instructions (Addendum)
Your chest wall pain does appear to be a combination of muscle and cartlidge pain. We gave you toradol 60 mg im in office. You can take 1-2 tabs your meloxicam 7.5 mg tabs a day(but start tomorrow). Make sure you eat something before taking the tablets. If tablets upset your stomach then stop taking.  Ekg looked normal. For safety/caution sake troponin stat included today.  For fatigue will get cbc, cmp, tsh and b12 level.  For htn will refill you bp meds.  We gave you flu vaccine today.   For your history of sugar high around 140 level will get a1c today.(3 month blood sugar average test.)  Follow up in 10 days or as needed.(on follow up schedule for early morning. And please come in fasting so we can check your cholesterol.  If you get at any point severe chest pain after hours then still advise ED evaluation. If pain were to persist might need to refer you to cardiologist due to risk factors.

## 2016-05-05 ENCOUNTER — Other Ambulatory Visit: Payer: Self-pay

## 2016-05-05 MED ORDER — METOPROLOL SUCCINATE ER 50 MG PO TB24: 50.0000 mg | ORAL_TABLET | Freq: Every day | ORAL | 3 refills | Status: DC

## 2016-05-06 ENCOUNTER — Other Ambulatory Visit: Payer: Self-pay

## 2016-05-06 ENCOUNTER — Telehealth: Payer: Self-pay | Admitting: Medical

## 2016-05-06 NOTE — Telephone Encounter (Signed)
Caller name: [name]  Relation to pt: spouse Call back number: [phone] Pharmacy:  Reason for call: Pt's spouse stated is needing all the rx to was given to pt yesterday 05-05-16 printed out since insurance does not cover the medication and pt is going to have Social Service help them with the meds but is needing a prescription of all the meds that was prescribed for pt yesterday printed out. Pt will pick up at our office as soon it is ready. Please call pt at tel above. Please advise.

## 2016-05-07 ENCOUNTER — Other Ambulatory Visit: Payer: Self-pay

## 2016-05-07 MED ORDER — LISINOPRIL-HYDROCHLOROTHIAZIDE 20-25 MG PO TABS: 1.0000 | ORAL_TABLET | Freq: Every day | ORAL | 3 refills | Status: DC

## 2016-05-07 MED ORDER — MELOXICAM 7.5 MG PO TABS: ORAL_TABLET | ORAL | 1 refills | Status: DC

## 2016-05-07 MED ORDER — METOPROLOL SUCCINATE ER 50 MG PO TB24: 50.0000 mg | ORAL_TABLET | Freq: Every day | ORAL | 3 refills | Status: DC

## 2016-05-07 NOTE — Telephone Encounter (Signed)
rx meloxicam printed.

## 2016-05-14 ENCOUNTER — Encounter: Payer: Self-pay | Admitting: Medical

## 2016-05-14 ENCOUNTER — Ambulatory Visit: Payer: Medicare Other | Admitting: Medical

## 2016-05-14 ENCOUNTER — Ambulatory Visit (INDEPENDENT_AMBULATORY_CARE_PROVIDER_SITE_OTHER): Payer: Medicare Other | Admitting: Medical

## 2016-05-14 VITALS — BP 152/82 | HR 76 | Temp 98.9°F | Ht 67.0 in | Wt 169.6 lb

## 2016-05-14 DIAGNOSIS — I10 Essential (primary) hypertension: Secondary | ICD-10-CM | POA: Diagnosis not present

## 2016-05-14 DIAGNOSIS — R0789 Other chest pain: Secondary | ICD-10-CM

## 2016-05-14 DIAGNOSIS — E785 Hyperlipidemia, unspecified: Secondary | ICD-10-CM

## 2016-05-14 DIAGNOSIS — S29012A Strain of muscle and tendon of back wall of thorax, initial encounter: Secondary | ICD-10-CM | POA: Diagnosis not present

## 2016-05-14 NOTE — Progress Notes (Signed)
Pre visit review using our clinic tool,if applicable. No additional management support is needed unless otherwise documented below in the visit note.  

## 2016-05-14 NOTE — Progress Notes (Signed)
Subjective:    Patient ID: Jillian Chandler, female    DOB: 1950/12/03, 65 y.o.   MRN: [id number]  HPI  Pt in for follow up.   Pt bp is high today. Pt has no bp machine at home. She wants rx for cuff. Pt did not take her bp medication today. She came in fasting earlier today but missed her appointment.She thought she could not take her medicine if fasting. She will take when she get home.   Pt has not had any left side  chest wall pain since last visit with me. Pt last ekg was normal. Her troponin was negative. Pt has not had any recurrent anterior left side chest pain as she had last time or when she went to ED. Looks like 04-26-2016 referral to cardiology was done but pt has not been notified. Both parents deceased from MI. Mom 7 yo deceased. Dad 24 yo. Mi. Brother had bypass 47 yo.  Pt had some left latisimuss dorsi area pain occasionally but less frequent and no as intense. Some noticed on movement. .Day of visit toradol stopped pain. Pt states meloxicam helps a lot.     Review of Systems  Constitutional: Negative for activity change, chills, diaphoresis, fatigue and fever.  Respiratory: Negative for cough, chest tightness and shortness of breath.   Cardiovascular: Negative for chest pain, palpitations and leg swelling.  Gastrointestinal: Negative for abdominal pain, nausea and vomiting.  Musculoskeletal: Negative for neck pain and neck stiffness.       Only occaisional left lat pain. But toradol stopped pain on last day of visit. And controlled with meloxicam now.  Neurological: Negative for dizziness, speech difficulty, weakness, numbness and headaches.  Psychiatric/Behavioral: Negative for agitation, behavioral problems and confusion. The patient is not nervous/anxious.    Past Medical History:  Diagnosis Date  . Breast cancer ([place]) 01/2009   left brest- status post mastectomy, intolerant to Arimidex, declined tamoxfien  . Gastric ulcer    CLO (-) per EGD ~ 11-2011  .  Hyperlipidemia   . Hypertension 2006     Social History   Social History  . Marital status: Married    Spouse name: N/A  . Number of children: 4  . Years of education: N/A   Occupational History  . [employer] and spatial designer   Social History Main Topics  . Smoking status: Former Smoker    Quit date: 02/17/2007  . Smokeless tobacco: Never Used  . Alcohol use No  . Drug use: No  . Sexual activity: Not on file   Other Topics Concern  . Not on file   Social History Narrative   Original from [place] ---   Diet: very  healthy   Exercise- not frequently but active at work                Past Surgical History:  Procedure Laterality Date  . COLONOSCOPY W/ POLYPECTOMY  11/2011  . MASTECTOMY Left 04/2009    Family History  Problem Relation Age of Onset  . Heart attack Father 20    F age 63, M age 76s  . Heart disease Father   . Diabetes Sister   . Diabetes Brother   . Diabetes Sister   . Heart disease Mother   . Diabetes Maternal Aunt   . Diabetes Maternal Uncle   . Diabetes Paternal Aunt   . Diabetes Paternal Uncle   . Diabetes Maternal Grandmother   . Diabetes Maternal Grandfather   .  Diabetes Paternal Grandmother   . Diabetes Paternal Grandfather   . Stroke Neg Hx   . Colon cancer Neg Hx   . Breast cancer Neg Hx     No Known Allergies  Current Outpatient Prescriptions on File Prior to Visit  Medication Sig Dispense Refill  . acetaminophen (TYLENOL) 500 MG tablet Take 1 tablet (500 mg total) by mouth every 6 (six) hours as needed. 30 tablet 0  . benzonatate (TESSALON) 100 MG capsule Take 1 capsule (100 mg total) by mouth every 8 (eight) hours. 21 capsule 0  . cholecalciferol (VITAMIN D) 1000 UNITS tablet Take 1,000 Units by mouth daily.    . cyclobenzaprine (FLEXERIL) 10 MG tablet Take 10 mg by mouth 2 (two) times daily.    . fish oil-omega-3 fatty acids 1000 MG capsule Take 2 g by mouth daily.    Marland Kitchen lisinopril-hydrochlorothiazide  (PRINZIDE,ZESTORETIC) 20-25 MG tablet Take 1 tablet by mouth daily. 30 tablet 3  . meloxicam (MOBIC) 7.5 MG tablet 1-2 tab po q day 30 tablet 1  . metoprolol succinate (TOPROL XL) 50 MG 24 hr tablet Take 1 tablet (50 mg total) by mouth daily. Take with or immediately following a meal. 30 tablet 3   No current facility-administered medications on file prior to visit.     BP (!) 152/82 Comment: Has not taken BP medication today  Pulse 76   Temp 98.9 F (37.2 C) (Oral)   Ht 5\' 7"  (1.702 m)   Wt 169 lb 9.6 oz (76.9 kg)   SpO2 97%   BMI 26.56 kg/m       Objective:   Physical Exam  General Mental Status- Alert. General Appearance- Not in acute distress.   Skin General: Color- Normal Color. Moisture- Normal Moisture.  Neck Carotid Arteries- Normal color. Moisture- Normal Moisture. No carotid bruits. No JVD.  Chest and Lung Exam Auscultation: Breath Sounds:-Normal.  Cardiovascular Auscultation:Rythm- Regular. Murmurs & Other Heart Sounds:Auscultation of the heart reveals- No Murmurs.  Abdomen Inspection:-Inspeection Normal. Palpation/Percussion:Note:No mass. Palpation and Percussion of the abdomen reveal- Non Tender, Non Distended + BS, no rebound or guarding.   Neurologic Cranial Nerve exam:- CN III-XII intact(No nystagmus), symmetric smile. Strength:- 5/5 equal and symmetric strength both upper and lower extremities.  Lt latisimuss dorsi area mild tender to palpation.      Assessment & Plan:  Your bp is high today but you did not take your bp med. Please take when you get home. Rx for bp cuff given today. Hopefully your insurance will cover. If not try to find one at [facility]. I think cost is about $30 dollars.  For your hx of chest pain and family history will see if staff will check on your cardiologist referral.  For your latisimuss dorsi area pain can continue your meloxicam.  Please get your lipid panel tomorrow in then morning fasting.  Follow up in one  month or as needed  Diquan Kassis, Percell Miller, PA-C   Note looks like she had referral to cardiology in past. I don't see that note. Bu tin light of pt very strong fh. I will make referal to cardiologist again.

## 2016-05-14 NOTE — Patient Instructions (Signed)
Your bp is high today but you did not take your bp med. Please take when you get home. Rx for bp cuff given today. Hopefully your insurance will cover. If not try to find one at [facility]. I think cost is about $30 dollars.  For your hx of chest pain and family history will see if staff will check on your cardiologist referral.  For your latisimuss dorsi area pain can continue your meloxicam.  Please get your lipid panel tomorrow in then morning fasting.  Follow up in one month or as needed

## 2016-05-17 ENCOUNTER — Encounter (HOSPITAL_COMMUNITY): Payer: Self-pay | Admitting: Family Medicine

## 2016-05-17 ENCOUNTER — Ambulatory Visit (HOSPITAL_COMMUNITY)
Admission: EM | Admit: 2016-05-17 | Discharge: 2016-05-17 | Disposition: A | Discharge status: Home / Self Care | Payer: Medicare Other | Attending: Internal Medicine | Admitting: Internal Medicine

## 2016-05-17 ENCOUNTER — Emergency Department (HOSPITAL_COMMUNITY)
Admission: EM | Admit: 2016-05-17 | Discharge: 2016-05-17 | Disposition: A | Discharge status: Home / Self Care | Payer: Medicare Other | Attending: Dermatology | Admitting: Dermatology

## 2016-05-17 ENCOUNTER — Encounter (HOSPITAL_COMMUNITY): Payer: Self-pay | Admitting: *Deleted

## 2016-05-17 DIAGNOSIS — I10 Essential (primary) hypertension: Secondary | ICD-10-CM | POA: Insufficient documentation

## 2016-05-17 DIAGNOSIS — Z5321 Procedure and treatment not carried out due to patient leaving prior to being seen by health care provider: Secondary | ICD-10-CM | POA: Diagnosis not present

## 2016-05-17 DIAGNOSIS — Z853 Personal history of malignant neoplasm of breast: Secondary | ICD-10-CM | POA: Diagnosis not present

## 2016-05-17 DIAGNOSIS — Z87891 Personal history of nicotine dependence: Secondary | ICD-10-CM | POA: Diagnosis not present

## 2016-05-17 NOTE — ED Provider Notes (Signed)
CSN: [id number]     Arrival date & time 05/17/16  1411 History   None    Chief Complaint  Patient presents with  . Hypertension   (Consider location/radiation/quality/duration/timing/severity/associated sxs/prior Treatment) HPI NP PT STATES THAT TODAY BP HAS BEEN UP AND DOWN. NO SYMPTOMS. AS HIGH AS 170-180 TOP NUMBER. WENT TO THE ER THIS MORNING, BUT DID NOT WAIT. BLOOD PRESSURE BECAME BETTER AFTER TAKING MEDS.  HAS APPOINTMENT WITH PCP FOR TOMORROW.   Past Medical History:  Diagnosis Date  . Breast cancer ([place]) 01/2009   left brest- status post mastectomy, intolerant to Arimidex, declined tamoxfien  . Gastric ulcer    CLO (-) per EGD ~ 11-2011  . Hyperlipidemia   . Hypertension 2006   Past Surgical History:  Procedure Laterality Date  . COLONOSCOPY W/ POLYPECTOMY  11/2011  . MASTECTOMY Left 04/2009   Family History  Problem Relation Age of Onset  . Heart attack Father 69    F age 5, M age 47s  . Heart disease Father   . Diabetes Sister   . Diabetes Brother   . Diabetes Sister   . Heart disease Mother   . Diabetes Maternal Aunt   . Diabetes Maternal Uncle   . Diabetes Paternal Aunt   . Diabetes Paternal Uncle   . Diabetes Maternal Grandmother   . Diabetes Maternal Grandfather   . Diabetes Paternal Grandmother   . Diabetes Paternal Grandfather   . Stroke Neg Hx   . Colon cancer Neg Hx   . Breast cancer Neg Hx    Social History  Substance Use Topics  . Smoking status: Former Smoker    Quit date: 02/17/2007  . Smokeless tobacco: Never Used  . Alcohol use No   OB History    Gravida Para Term Preterm AB Living   4 3 3   1 3    SAB TAB Ectopic Multiple Live Births   1       3     Review of Systems  Denies: HEADACHE, NAUSEA, ABDOMINAL PAIN, CHEST PAIN, CONGESTION, DYSURIA, SHORTNESS OF BREATH  Allergies  Review of patient's allergies indicates no known allergies.  Home Medications   Prior to Admission medications   Medication Sig Start Date End Date Taking?  Authorizing Provider  acetaminophen (TYLENOL) 500 MG tablet Take 1 tablet (500 mg total) by mouth every 6 (six) hours as needed. 09/15/15   Antonietta Breach, PA-C  benzonatate (TESSALON) 100 MG capsule Take 1 capsule (100 mg total) by mouth every 8 (eight) hours. 09/15/15   Antonietta Breach, PA-C  cholecalciferol (VITAMIN D) 1000 UNITS tablet Take 1,000 Units by mouth daily.    Historical Provider, MD  cyclobenzaprine (FLEXERIL) 10 MG tablet Take 10 mg by mouth 2 (two) times daily.    Historical Provider, MD  fish oil-omega-3 fatty acids 1000 MG capsule Take 2 g by mouth daily.    Historical Provider, MD  lisinopril-hydrochlorothiazide (PRINZIDE,ZESTORETIC) 20-25 MG tablet Take 1 tablet by mouth daily. 05/07/16   Mackie Pai, PA-C  meloxicam (MOBIC) 7.5 MG tablet 1-2 tab po q day 05/07/16   Mackie Pai, PA-C  metoprolol succinate (TOPROL XL) 50 MG 24 hr tablet Take 1 tablet (50 mg total) by mouth daily. Take with or immediately following a meal. 05/07/16   Mackie Pai, PA-C   Meds Ordered and Administered this Visit  Medications - No data to display  BP 147/77   Pulse 80   Temp 97.9 F (36.6 C)   Resp 18  SpO2 98%  No data found.   Physical Exam NURSES NOTES AND VITAL SIGNS REVIEWED. CONSTITUTIONAL: Well developed, well nourished, no acute distress HEENT: normocephalic, atraumatic EYES: Conjunctiva normal NECK:normal ROM, supple, no adenopathy PULMONARY:No respiratory distress, normal effort ABDOMINAL: Soft, ND, NT BS+, No CVAT MUSCULOSKELETAL: Normal ROM of all extremities,  SKIN: warm and dry without rash PSYCHIATRIC: Mood and affect, behavior are normal  Urgent Care Course   Clinical Course    Procedures (including critical care time)  Labs Review Labs Reviewed - No data to display  Imaging Review No results found.   Visual Acuity Review  Right Eye Distance:   Left Eye Distance:   Bilateral Distance:    Right Eye Near:   Left Eye Near:    Bilateral Near:          MDM   1. Hypertension, unspecified type     Patient is reassured that there are no issues that require transfer to higher level of care at this time or additional tests. Patient is advised to continue home symptomatic treatment. Patient is advised that if there are new or worsening symptoms to attend the emergency department, contact primary care provider, or return to UC. Instructions of care provided discharged home in stable condition.    THIS NOTE WAS GENERATED USING A VOICE RECOGNITION SOFTWARE PROGRAM. ALL REASONABLE EFFORTS  WERE MADE TO PROOFREAD THIS DOCUMENT FOR ACCURACY.  I have verbally reviewed the discharge instructions with the patient. A printed AVS was given to the patient.  All questions were answered prior to discharge.      Konrad Felix, [place] 05/17/16 [phone]

## 2016-05-17 NOTE — ED Triage Notes (Signed)
Pt here for elevated BP. sts she has been checking it at home and [id number] and [id number] systolic. Here BP 147/77. Pt denies any associated complaints.

## 2016-05-17 NOTE — ED Triage Notes (Signed)
The pts bp is high this am she has a headache and nausea

## 2016-05-17 NOTE — Discharge Instructions (Signed)
OFTEN TIMES THE TIME OF DAY THAT YOU TAKE YOUR BLOOD PRESSURE CAN EFFECT THE HIGH AND LOW.   BE SURE TO TAKE YOU BLOOD PRESSURE AT THE SAME TIME DAILY. TRY TAKING YOUR BP AFTER 7 AM AND ONLY CHECK IT ONCE. YOU CAN THEN REPEAT IT IN THE AFTERNOON AROUND 4 PM.   BE SURE TO CONTINUE TO TAKE YOUR MEDICATIONS AS DIRECTED.

## 2016-05-17 NOTE — ED Triage Notes (Signed)
The pts husband  Has checked  The pts bp and it is 124/72 now/ he is taking her home she has an appointment on Tuesday with her  doctor

## 2016-05-18 ENCOUNTER — Ambulatory Visit (INDEPENDENT_AMBULATORY_CARE_PROVIDER_SITE_OTHER): Payer: Medicare Other | Admitting: Cardiology

## 2016-05-18 ENCOUNTER — Other Ambulatory Visit (INDEPENDENT_AMBULATORY_CARE_PROVIDER_SITE_OTHER): Payer: Medicare Other

## 2016-05-18 ENCOUNTER — Encounter: Payer: Self-pay | Admitting: Cardiology

## 2016-05-18 VITALS — BP 144/78 | HR 82 | Ht 67.0 in | Wt 167.0 lb

## 2016-05-18 DIAGNOSIS — R0789 Other chest pain: Secondary | ICD-10-CM

## 2016-05-18 DIAGNOSIS — E7800 Pure hypercholesterolemia, unspecified: Secondary | ICD-10-CM | POA: Diagnosis not present

## 2016-05-18 DIAGNOSIS — I10 Essential (primary) hypertension: Secondary | ICD-10-CM

## 2016-05-18 DIAGNOSIS — R079 Chest pain, unspecified: Secondary | ICD-10-CM

## 2016-05-18 LAB — LIPID PANEL
Cholesterol: 222 mg/dL — ABNORMAL HIGH (ref 0–200)
HDL: 44.3 mg/dL (ref >39.00)
LDL CALC: 145 mg/dL — AB (ref 0–99)
NONHDL: 177.43
Total CHOL/HDL Ratio: 5
Triglycerides: 163 mg/dL — ABNORMAL HIGH (ref 0.0–149.0)
VLDL: 32.6 mg/dL (ref 0.0–40.0)

## 2016-05-18 MED ORDER — ATORVASTATIN CALCIUM 20 MG PO TABS: 20.0000 mg | ORAL_TABLET | Freq: Every day | ORAL | 3 refills | Status: DC

## 2016-05-18 NOTE — Patient Instructions (Signed)
We will schedule you for a nuclear stress test  I would consider taking a statin medication for your cholesterol. You can discuss this with primary care.

## 2016-05-18 NOTE — Telephone Encounter (Signed)
rx atorvastatin sent to her pharmacy.

## 2016-05-18 NOTE — Progress Notes (Signed)
Cardiology Office Note    Date:  05/18/2016   ID:  Jillian Chandler, DOB [birth year], MRN [id number]  PCP:  Mackie Pai, PA-C  Cardiologist:  Katrese Shell [place], MD    History of Present Illness:  Jillian Chandler is a 65 y.o. female seen at the request of Mackie Pai PA-C for evaluation of chest pain. She is seen with an interpreter. She has recently developed left precordial chest pain- first on 05/02/16. She went to the ED and was diagnosed with musculoskeletal pain. She was given Toradol IV with improvement. She is s/p left mastectomy for breast CA. She was seen in ED again last week with elevated BP. She does have a history of HTN and HL. She has a very strong family history of CAD with mother dying at age 12 and father at age 81. One brother had CABG at age 7. She still notes occasion pain in left chest. Not clearly related to exertion. No dyspnea. States she had similar pain 2 years ago.     Past Medical History:  Diagnosis Date  . Breast cancer ([place]) 01/2009   left brest- status post mastectomy, intolerant to Arimidex, declined tamoxfien  . Gastric ulcer    CLO (-) per EGD ~ 11-2011  . Hyperlipidemia   . Hypertension 2006    Past Surgical History:  Procedure Laterality Date  . COLONOSCOPY W/ POLYPECTOMY  11/2011  . MASTECTOMY Left 04/2009    Current Medications: Outpatient Medications Prior to Visit  Medication Sig Dispense Refill  . acetaminophen (TYLENOL) 500 MG tablet Take 1 tablet (500 mg total) by mouth every 6 (six) hours as needed. 30 tablet 0  . benzonatate (TESSALON) 100 MG capsule Take 1 capsule (100 mg total) by mouth every 8 (eight) hours. 21 capsule 0  . cholecalciferol (VITAMIN D) 1000 UNITS tablet Take 1,000 Units by mouth daily.    . cyclobenzaprine (FLEXERIL) 10 MG tablet Take 10 mg by mouth 2 (two) times daily.    . fish oil-omega-3 fatty acids 1000 MG capsule Take 2 g by mouth daily.    Marland Kitchen lisinopril-hydrochlorothiazide (PRINZIDE,ZESTORETIC) 20-25 MG tablet  Take 1 tablet by mouth daily. 30 tablet 3  . meloxicam (MOBIC) 7.5 MG tablet 1-2 tab po q day 30 tablet 1  . metoprolol succinate (TOPROL XL) 50 MG 24 hr tablet Take 1 tablet (50 mg total) by mouth daily. Take with or immediately following a meal. 30 tablet 3   No facility-administered medications prior to visit.      Allergies:   Review of patient's allergies indicates no known allergies.   Social History   Social History  . Marital status: Married    Spouse name: N/A  . Number of children: 4  . Years of education: N/A   Occupational History  . [employer] and spatial designer   Social History Main Topics  . Smoking status: Former Smoker    Quit date: 02/17/2007  . Smokeless tobacco: Never Used  . Alcohol use No  . Drug use: No  . Sexual activity: Not Asked   Other Topics Concern  . None   Social History Narrative   Original from [place] ---   Diet: very  healthy   Exercise- not frequently but active at work                 Family History:  The patient's family history includes Diabetes in her brother, maternal aunt, maternal grandfather, maternal grandmother, maternal uncle, paternal aunt, paternal  grandfather, paternal grandmother, paternal uncle, sister, and sister; Heart attack (age of onset: 44) in her father; Heart disease in her father and mother.   ROS:   Please see the history of present illness.    ROS All other systems reviewed and are negative.   PHYSICAL EXAM:   VS:  BP (!) 144/78   Pulse 82   Ht 5\' 7"  (1.702 m)   Wt 167 lb (75.8 kg)   BMI 26.16 kg/m    GEN: Well nourished, well developed, in no acute distress  HEENT: normal  Neck: no JVD, carotid bruits, or masses Cardiac: RRR; no murmurs, rubs, or gallops,no edema. S/p left mastectomy. Mild chest wall pain to palpation.  Respiratory:  clear to auscultation bilaterally, normal work of breathing GI: soft, nontender, nondistended, + BS MS: no deformity or atrophy  Skin: warm and dry, no  rash Neuro:  Alert and Oriented x 3, Strength and sensation are intact Psych: euthymic mood, full affect  Wt Readings from Last 3 Encounters:  05/18/16 167 lb (75.8 kg)  05/17/16 168 lb 3 oz (76.3 kg)  05/14/16 169 lb 9.6 oz (76.9 kg)      Studies/Labs Reviewed:   EKG:   EKG dated 05/04/16 shows NSR with normal ECG. Rate 65. I have personally reviewed and interpreted this study.  Recent Labs: 05/04/2016: ALT 20; BUN 13; Creatinine, Ser 0.48; Hemoglobin 14.9; Platelets 305.0; Potassium 4.4; Sodium 139; TSH 1.05   Lipid Panel    Component Value Date/Time   CHOL 222 (H) 05/18/2016 0710   TRIG 163.0 (H) 05/18/2016 0710   HDL 44.30 05/18/2016 0710   CHOLHDL 5 05/18/2016 0710   VLDL 32.6 05/18/2016 0710   LDLCALC 145 (H) 05/18/2016 0710    Additional studies/ records that were reviewed today include:  none    ASSESSMENT:    1. Pure hypercholesterolemia   2. Essential hypertension   3. Chest pain with moderate risk for cardiac etiology      PLAN:  In order of problems listed above:  1. I suspect her recent chest pain symptoms are musculoskeletal but she does have significant risk factors for CAD including HTN, HL, and family history. I have recommended a Lexiscan Myoview study to further stratify her risk. She is unable to walk on a treadmill.  2. Given her risk factors I would recommend statin therapy to treat her hypercholesterolemia with LDL 139. She can discuss this with primary care 3. Continue current BP medication.    Medication Adjustments/Labs and Tests Ordered: Current medicines are reviewed at length with the patient today.  Concerns regarding medicines are outlined above.  Medication changes, Labs and Tests ordered today are listed in the Patient Instructions below. Patient Instructions  We will schedule you for a nuclear stress test  I would consider taking a statin medication for your cholesterol. You can discuss this with primary care.     Signed, Sherol Sabas Martin[place], MD  05/18/2016 4:49 PM    [facility] [address], [place], [place], [address] [phone]

## 2016-05-20 NOTE — Progress Notes (Signed)
PT was called and informed about her labs and meds to pick up and pt understood.

## 2016-05-22 ENCOUNTER — Telehealth (HOSPITAL_COMMUNITY): Payer: Self-pay

## 2016-05-22 NOTE — Telephone Encounter (Signed)
Encounter complete. 

## 2016-05-26 DIAGNOSIS — M5116 Intervertebral disc disorders with radiculopathy, lumbar region: Secondary | ICD-10-CM | POA: Diagnosis not present

## 2016-05-26 DIAGNOSIS — M549 Dorsalgia, unspecified: Secondary | ICD-10-CM | POA: Diagnosis not present

## 2016-05-26 DIAGNOSIS — M791 Myalgia: Secondary | ICD-10-CM | POA: Diagnosis not present

## 2016-05-26 DIAGNOSIS — M4726 Other spondylosis with radiculopathy, lumbar region: Secondary | ICD-10-CM | POA: Diagnosis not present

## 2016-05-27 ENCOUNTER — Inpatient Hospital Stay (HOSPITAL_COMMUNITY): Admission: RE | Admit: 2016-05-27 | Payer: Medicare Other | Source: Ambulatory Visit

## 2016-06-15 ENCOUNTER — Ambulatory Visit (INDEPENDENT_AMBULATORY_CARE_PROVIDER_SITE_OTHER): Payer: Medicare Other | Admitting: Medical

## 2016-06-15 ENCOUNTER — Encounter: Payer: Self-pay | Admitting: Medical

## 2016-06-15 VITALS — BP 130/82 | HR 77 | Temp 98.1°F | Ht 67.0 in | Wt 166.2 lb

## 2016-06-15 DIAGNOSIS — I10 Essential (primary) hypertension: Secondary | ICD-10-CM

## 2016-06-15 DIAGNOSIS — E785 Hyperlipidemia, unspecified: Secondary | ICD-10-CM

## 2016-06-15 MED ORDER — METOPROLOL SUCCINATE ER 50 MG PO TB24: 50.0000 mg | ORAL_TABLET | Freq: Every day | ORAL | 3 refills | Status: DC

## 2016-06-15 MED ORDER — LISINOPRIL-HYDROCHLOROTHIAZIDE 20-25 MG PO TABS: 1.0000 | ORAL_TABLET | Freq: Every day | ORAL | 3 refills | Status: DC

## 2016-06-15 NOTE — Progress Notes (Signed)
Pre visit review using our clinic review tool, if applicable. No additional management support is needed unless otherwise documented below in the visit note. 

## 2016-06-15 NOTE — Progress Notes (Signed)
Subjective:    Patient ID: Jillian Chandler, female    DOB: November 26, 1950, 65 y.o.   MRN: [id number]  HPI  Pt in for bp check. Her bp is a lot better now. Pt is on toprol xl 50 mg one tab a day.  Pt also has high cholesterol. She is on lipitor.   Pt saw cardiologist and he scheduled myocardial perfusion. This was canceled due to back pain.  Pt has has follow up with  back pain specialist end of this month. When her back is better she will get stress test.  Small bruise behind rt shoulder from fall. 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 No head trauma. [place] this weekend. No pain just states small bruise rt side of her back.    Review of Systems  Constitutional: Negative for chills, fatigue and fever.  Respiratory: Negative for cough, chest tightness, shortness of breath and wheezing.   Cardiovascular: Negative for chest pain and palpitations.  Gastrointestinal: Negative for abdominal pain.  Musculoskeletal:       Small bruise rt lat area.  Skin:       Small bruise rt lat area.  Hematological: Negative for adenopathy. Does not bruise/bleed easily.  Psychiatric/Behavioral: Negative for behavioral problems, confusion, sleep disturbance and suicidal ideas. The patient is not nervous/anxious.    Past Medical History:  Diagnosis Date  . Breast cancer ([place]) 01/2009   left brest- status post mastectomy, intolerant to Arimidex, declined tamoxfien  . Gastric ulcer    CLO (-) per EGD ~ 11-2011  . Hyperlipidemia   . Hypertension 2006     Social History   Social History  . Marital status: Married    Spouse name: N/A  . Number of children: 4  . Years of education: N/A   Occupational History  . [employer] and spatial designer   Social History Main Topics  . Smoking status: Former Smoker    Quit date: 02/17/2007  . Smokeless tobacco: Never Used  . Alcohol use No  . Drug use: No  . Sexual activity: Not on file   Other Topics Concern  . Not on file   Social History  Narrative   Original from [place] ---   Diet: very  healthy   Exercise- not frequently but active at work                Past Surgical History:  Procedure Laterality Date  . COLONOSCOPY W/ POLYPECTOMY  11/2011  . MASTECTOMY Left 04/2009    Family History  Problem Relation Age of Onset  . Heart attack Father 61    F age 5, M age 70s  . Heart disease Father   . Diabetes Sister   . Diabetes Brother   . Diabetes Sister   . Heart disease Mother   . Diabetes Maternal Aunt   . Diabetes Maternal Uncle   . Diabetes Paternal Aunt   . Diabetes Paternal Uncle   . Diabetes Maternal Grandmother   . Diabetes Maternal Grandfather   . Diabetes Paternal Grandmother   . Diabetes Paternal Grandfather   . Stroke Neg Hx   . Colon cancer Neg Hx   . Breast cancer Neg Hx     No Known Allergies  Current Outpatient Prescriptions on File Prior to Visit  Medication Sig Dispense Refill  . acetaminophen (TYLENOL) 500 MG tablet Take 1 tablet (500 mg total) by mouth every 6 (six) hours as needed. 30 tablet 0  . atorvastatin (  LIPITOR) 20 MG tablet Take 1 tablet (20 mg total) by mouth daily. 30 tablet 3  . cholecalciferol (VITAMIN D) 1000 UNITS tablet Take 1,000 Units by mouth daily.    . cyclobenzaprine (FLEXERIL) 10 MG tablet Take 10 mg by mouth 2 (two) times daily.    . fish oil-omega-3 fatty acids 1000 MG capsule Take 2 g by mouth daily.    . meloxicam (MOBIC) 7.5 MG tablet 1-2 tab po q day 30 tablet 1   No current facility-administered medications on file prior to visit.     BP 130/82 (BP Location: Right Arm, Patient Position: Sitting)   Pulse 77   Temp 98.1 F (36.7 C) (Oral)   Ht 5\' 7"  (1.702 m)   Wt 166 lb 3.2 oz (75.4 kg)   SpO2 98%   BMI 26.03 kg/m       Objective:   Physical Exam  General Mental Status- Alert. General Appearance- Not in acute distress.   Skin General: Color- Normal Color. Moisture- Normal Moisture.  Neck Carotid Arteries- Normal color. Moisture-  Normal Moisture. No carotid bruits. No JVD.  Chest and Lung Exam Auscultation: Breath Sounds:-Normal.  Cardiovascular Auscultation:Rythm- Regular. Murmurs & Other Heart Sounds:Auscultation of the heart reveals- No Murmurs.  Abdomen Inspection:-Inspeection Normal. Palpation/Percussion:Note:No mass. Palpation and Percussion of the abdomen reveal- Non Tender, Non Distended + BS, no rebound or guarding.   Neurologic Cranial Nerve exam:- CN III-XII intact(No nystagmus), symmetric smile. Strength:- 5/5 equal and symmetric strength both upper and lower extremities.  Rt latisimuss area.- small bruise from(no other regional injury)      Assessment & Plan:  Your htn is well controlled. Will refill you meds today.  For you cholesterol continue lipitor.  Will repeat lipid panel and cmp in 3 months fasting.  When back better please reschedule you stress test.  Any recurrent cardiac type symptoms before stress test then ED evaluation.  Follow up in 3 month or as needed

## 2016-06-15 NOTE — Patient Instructions (Addendum)
Your htn is well controlled. Will refill you meds today.  For you cholesterol continue lipitor.  Will repeat lipid panel and cmp in 3 months fasting.  When back better please reschedule you stress test.  Any recurrent cardiac type symptoms before stress test then ED evaluation.  Follow up in 3 month or as needed

## 2016-06-23 DIAGNOSIS — M5116 Intervertebral disc disorders with radiculopathy, lumbar region: Secondary | ICD-10-CM | POA: Diagnosis not present

## 2016-06-23 DIAGNOSIS — M791 Myalgia: Secondary | ICD-10-CM | POA: Diagnosis not present

## 2016-06-23 DIAGNOSIS — M4726 Other spondylosis with radiculopathy, lumbar region: Secondary | ICD-10-CM | POA: Diagnosis not present

## 2016-07-01 DIAGNOSIS — M5116 Intervertebral disc disorders with radiculopathy, lumbar region: Secondary | ICD-10-CM | POA: Diagnosis not present

## 2016-08-18 ENCOUNTER — Other Ambulatory Visit (INDEPENDENT_AMBULATORY_CARE_PROVIDER_SITE_OTHER): Payer: Medicare Other

## 2016-08-18 ENCOUNTER — Telehealth: Payer: Self-pay | Admitting: Medical

## 2016-08-18 DIAGNOSIS — E785 Hyperlipidemia, unspecified: Secondary | ICD-10-CM

## 2016-08-18 DIAGNOSIS — I10 Essential (primary) hypertension: Secondary | ICD-10-CM | POA: Diagnosis not present

## 2016-08-18 LAB — LIPID PANEL
CHOL/HDL RATIO: 4
Cholesterol: 220 mg/dL — ABNORMAL HIGH (ref 0–200)
HDL: 50.3 mg/dL (ref >39.00)
LDL Cholesterol: 150 mg/dL — ABNORMAL HIGH (ref 0–99)
NONHDL: 169.97
Triglycerides: 102 mg/dL (ref 0.0–149.0)
VLDL: 20.4 mg/dL (ref 0.0–40.0)

## 2016-08-18 LAB — COMPREHENSIVE METABOLIC PANEL
ALT: 14 U/L (ref 0–35)
AST: 12 U/L (ref 0–37)
Albumin: 4.1 g/dL (ref 3.5–5.2)
Alkaline Phosphatase: 75 U/L (ref 39–117)
BILIRUBIN TOTAL: 0.4 mg/dL (ref 0.2–1.2)
BUN: 16 mg/dL (ref 6–23)
CALCIUM: 10 mg/dL (ref 8.4–10.5)
CO2: 32 mEq/L (ref 19–32)
CREATININE: 0.57 mg/dL (ref 0.40–1.20)
Chloride: 101 mEq/L (ref 96–112)
GFR: 113.6 mL/min (ref >60.00)
GLUCOSE: 110 mg/dL — AB (ref 70–99)
Potassium: 3.7 mEq/L (ref 3.5–5.1)
Sodium: 140 mEq/L (ref 135–145)
TOTAL PROTEIN: 7.3 g/dL (ref 6.0–8.3)

## 2016-08-18 MED ORDER — ATORVASTATIN CALCIUM 40 MG PO TABS: 40.0000 mg | ORAL_TABLET | Freq: Every day | ORAL | 0 refills | Status: DC

## 2016-08-18 NOTE — Telephone Encounter (Signed)
Refill of higher dose atorvastatin sent to her pharmacy. Discontinue the  20 mg dose.

## 2016-08-21 ENCOUNTER — Telehealth: Payer: Self-pay | Admitting: Medical

## 2016-08-21 NOTE — Telephone Encounter (Signed)
Caller name: Chace  Relation to pt: Self Call back number: Pharmacy:  Reason for call: Pt informed that would like to receive a copy of her last lab results by mail, if possible. Please advise.

## 2016-08-24 NOTE — Telephone Encounter (Signed)
Results Letter in outgoing mail/SLS 01/22

## 2016-08-28 ENCOUNTER — Telehealth: Payer: Self-pay | Admitting: Medical

## 2016-08-28 DIAGNOSIS — E785 Hyperlipidemia, unspecified: Secondary | ICD-10-CM

## 2016-08-28 DIAGNOSIS — I10 Essential (primary) hypertension: Secondary | ICD-10-CM

## 2016-08-28 NOTE — Telephone Encounter (Signed)
Caller name: Almyra Free Relation to pt: spouse  Call back number: [phone] Pharmacy:  Reason for call: Pt spouse stated on behalf of wife that she would like all her messages can be in [place].

## 2016-09-01 NOTE — Telephone Encounter (Signed)
Noted  

## 2016-11-30 ENCOUNTER — Other Ambulatory Visit: Payer: Medicare Other

## 2016-12-25 ENCOUNTER — Encounter: Payer: Self-pay | Admitting: Gynecology

## 2016-12-25 DIAGNOSIS — N6312 Unspecified lump in the right breast, upper inner quadrant: Secondary | ICD-10-CM | POA: Diagnosis not present

## 2016-12-25 DIAGNOSIS — Z853 Personal history of malignant neoplasm of breast: Secondary | ICD-10-CM | POA: Diagnosis not present

## 2016-12-25 DIAGNOSIS — N644 Mastodynia: Secondary | ICD-10-CM | POA: Diagnosis not present

## 2017-01-13 ENCOUNTER — Other Ambulatory Visit: Payer: Self-pay | Admitting: Medical

## 2017-01-13 NOTE — Telephone Encounter (Signed)
Pt is due for follow up please call and schedule appointment.  

## 2017-01-25 NOTE — Telephone Encounter (Signed)
Attempted to contact patient to schedule appointment. Voicemail has not been set up and patient does not use My Chart

## 2017-01-26 NOTE — Telephone Encounter (Signed)
Tried to reach pt again to again to schedule follow up appointment. No answer and voicemail is not set up.

## 2017-02-19 ENCOUNTER — Ambulatory Visit (HOSPITAL_BASED_OUTPATIENT_CLINIC_OR_DEPARTMENT_OTHER)
Admission: RE | Admit: 2017-02-19 | Discharge: 2017-02-19 | Disposition: A | Discharge status: Home / Self Care | Payer: Medicare Other | Source: Ambulatory Visit | Attending: Medical | Admitting: Medical

## 2017-02-19 ENCOUNTER — Encounter: Payer: Self-pay | Admitting: Medical

## 2017-02-19 ENCOUNTER — Ambulatory Visit (INDEPENDENT_AMBULATORY_CARE_PROVIDER_SITE_OTHER): Payer: Medicare Other | Admitting: Medical

## 2017-02-19 VITALS — BP 135/70 | HR 70 | Temp 97.9°F | Resp 16 | Ht 67.0 in | Wt 169.8 lb

## 2017-02-19 DIAGNOSIS — M25522 Pain in left elbow: Secondary | ICD-10-CM | POA: Diagnosis not present

## 2017-02-19 DIAGNOSIS — M25561 Pain in right knee: Secondary | ICD-10-CM

## 2017-02-19 DIAGNOSIS — G8929 Other chronic pain: Secondary | ICD-10-CM | POA: Diagnosis not present

## 2017-02-19 DIAGNOSIS — M25512 Pain in left shoulder: Secondary | ICD-10-CM

## 2017-02-19 DIAGNOSIS — E785 Hyperlipidemia, unspecified: Secondary | ICD-10-CM

## 2017-02-19 DIAGNOSIS — M25562 Pain in left knee: Secondary | ICD-10-CM

## 2017-02-19 DIAGNOSIS — I10 Essential (primary) hypertension: Secondary | ICD-10-CM

## 2017-02-19 DIAGNOSIS — M170 Bilateral primary osteoarthritis of knee: Secondary | ICD-10-CM | POA: Diagnosis not present

## 2017-02-19 MED ORDER — LISINOPRIL-HYDROCHLOROTHIAZIDE 20-25 MG PO TABS: 1.0000 | ORAL_TABLET | Freq: Every day | ORAL | 1 refills | Status: DC

## 2017-02-19 MED ORDER — MELOXICAM 7.5 MG PO TABS: ORAL_TABLET | ORAL | 1 refills | Status: DC

## 2017-02-19 MED ORDER — METOPROLOL SUCCINATE ER 50 MG PO TB24: ORAL_TABLET | ORAL | 1 refills | Status: DC

## 2017-02-19 NOTE — Patient Instructions (Addendum)
Bp is well controlled today. Continue current bp meds. I think you may need new bp machine otc. Check bp with new machine to confirm bp still well controlled.  For high cholesterol please get cmp and lipid panel this Monday.  For area of pain get xrays today. Meloxicam for the pain. For the elbow get otc elbow brace for tendinitis. May refer to sports med after xray review.  Follow up in 3 month or as needed

## 2017-02-19 NOTE — Progress Notes (Signed)
Subjective:    Patient ID: Jillian Chandler, female    DOB: Mar 13, 1951, 66 y.o.   MRN: [id number]  HPI   Pt in for follow up.  Pt had one blood pressure reading yesterday 885 systolic. Pt checked with her machine. She states faint ha then bp came back down. Today her bp is better. Pt does not check her bp on regular basis. Checks only when she feels bad. She states yesterday seemed to occur after outside in the sun93-4 hours) when had bp increase. She is not sure why bp increased. Today feels fine. Pt states machine she has for bp is very old.  Pt also notes some left shoulder pain on and off for 10 yrs. On and off pain in shoulder(told in [place] cause of pain was rotator cuff). Also briefly in past had brief frozen shoulder remote past.  Recent pain with lateral elbow pain x 5 weeks.  Also has bilateral knee pain.     Review of Systems  Constitutional: Negative for chills, fatigue and fever.  Respiratory: Negative for cough, chest tightness, shortness of breath and wheezing.   Cardiovascular: Negative for chest pain and palpitations.  Gastrointestinal: Negative for abdominal pain, blood in stool, constipation, diarrhea and vomiting.  Musculoskeletal: Negative for back pain and myalgias.       Shoulder, left elbow, knee pain.  Skin: Negative for rash.  Neurological: Negative for dizziness, speech difficulty, weakness, numbness and headaches.  Hematological: Negative for adenopathy. Does not bruise/bleed easily.  Psychiatric/Behavioral: Negative for behavioral problems and confusion.   Past Medical History:  Diagnosis Date  . Breast cancer ([place]) 01/2009   left brest- status post mastectomy, intolerant to Arimidex, declined tamoxfien  . Gastric ulcer    CLO (-) per EGD ~ 11-2011  . Hyperlipidemia   . Hypertension 2006     Social History   Social History  . Marital status: Married    Spouse name: N/A  . Number of children: 4  . Years of education: N/A   Occupational History   . [employer] designer   Social History Main Topics  . Smoking status: Former Smoker    Quit date: 02/17/2007  . Smokeless tobacco: Never Used  . Alcohol use No  . Drug use: No  . Sexual activity: Not on file   Other Topics Concern  . Not on file   Social History Narrative   Original from [place] ---   Diet: very  healthy   Exercise- not frequently but active at work                Past Surgical History:  Procedure Laterality Date  . COLONOSCOPY W/ POLYPECTOMY  11/2011  . MASTECTOMY Left 04/2009    Family History  Problem Relation Age of Onset  . Heart attack Father 51       F age 37, M age 53s  . Heart disease Father   . Diabetes Sister   . Diabetes Brother   . Diabetes Sister   . Heart disease Mother   . Diabetes Maternal Aunt   . Diabetes Maternal Uncle   . Diabetes Paternal Aunt   . Diabetes Paternal Uncle   . Diabetes Maternal Grandmother   . Diabetes Maternal Grandfather   . Diabetes Paternal Grandmother   . Diabetes Paternal Grandfather   . Stroke Neg Hx   . Colon cancer Neg Hx   . Breast cancer Neg Hx     No Known  Allergies  Current Outpatient Prescriptions on File Prior to Visit  Medication Sig Dispense Refill  . acetaminophen (TYLENOL) 500 MG tablet Take 1 tablet (500 mg total) by mouth every 6 (six) hours as needed. 30 tablet 0  . atorvastatin (LIPITOR) 40 MG tablet Take 1 tablet (40 mg total) by mouth daily. 90 tablet 0  . cholecalciferol (VITAMIN D) 1000 UNITS tablet Take 1,000 Units by mouth daily.    . cyclobenzaprine (FLEXERIL) 10 MG tablet Take 10 mg by mouth 2 (two) times daily.    . fish oil-omega-3 fatty acids 1000 MG capsule Take 2 g by mouth daily.     No current facility-administered medications on file prior to visit.     BP 116/63   Pulse 70   Temp 97.9 F (36.6 C) (Oral)   Resp 16   Ht 5\' 7"  (1.702 m)   Wt 169 lb 12.8 oz (77 kg)   SpO2 97%   BMI 26.59 kg/m       Objective:   Physical  Exam   General Mental Status- Alert. General Appearance- Not in acute distress.   Skin General: Color- Normal Color. Moisture- Normal Moisture.  Neck Carotid Arteries- Normal color. Moisture- Normal Moisture. No carotid bruits. No JVD.  Chest and Lung Exam Auscultation: Breath Sounds:-Normal.  Cardiovascular Auscultation:Rythm- Regular. Murmurs & Other Heart Sounds:Auscultation of the heart reveals- No Murmurs.  Abdomen Inspection:-Inspeection Normal. Palpation/Percussion:Note:No mass. Palpation and Percussion of the abdomen reveal- Non Tender, Non Distended + BS, no rebound or guarding.  Neurologic Cranial Nerve exam:- CN III-XII intact(No nystagmus), symmetric smile. tStrength:- 5/5 equal and symmetric strength both upper and lower extremities.  Lt shoulder- moderatepain on rom presently. No crepitus. Difficulty fully abducting above shoulder level. Posterior aspect pain on palpation.  Left elbow- lateral epicondyle pain. No swelling of elbow. Good rom.  Knees- from, no crepitus bilaterally.         Assessment & Plan:  Bp is well controlled today. Continue current bp meds. I think you may need new bp machine otc. Check bp with new machine to confirm bp still well controlled.  For high cholesterol please get cmp and lipid panel this Monday.  For area of pain get xrays today. Meloxicam for the pain. For the elbow get otc elbow brace for tendinitis. May refer to sports med after xray review.  Follow up in 3 month or as needed  .Anvitha Hutmacher, Percell Miller, PA-C

## 2017-02-20 ENCOUNTER — Telehealth: Payer: Self-pay | Admitting: Medical

## 2017-02-20 DIAGNOSIS — M25569 Pain in unspecified knee: Secondary | ICD-10-CM

## 2017-02-20 DIAGNOSIS — M25519 Pain in unspecified shoulder: Secondary | ICD-10-CM

## 2017-02-20 DIAGNOSIS — M25522 Pain in left elbow: Secondary | ICD-10-CM

## 2017-02-20 DIAGNOSIS — G8929 Other chronic pain: Secondary | ICD-10-CM

## 2017-02-20 DIAGNOSIS — M25562 Pain in left knee: Secondary | ICD-10-CM

## 2017-02-20 DIAGNOSIS — M25561 Pain in right knee: Secondary | ICD-10-CM

## 2017-02-20 NOTE — Telephone Encounter (Signed)
Referral to sports med

## 2017-02-20 NOTE — Telephone Encounter (Signed)
Sports med referral?

## 2017-02-22 ENCOUNTER — Telehealth: Payer: Self-pay | Admitting: Medical

## 2017-02-22 ENCOUNTER — Other Ambulatory Visit (INDEPENDENT_AMBULATORY_CARE_PROVIDER_SITE_OTHER): Payer: Medicare Other

## 2017-02-22 DIAGNOSIS — I10 Essential (primary) hypertension: Secondary | ICD-10-CM

## 2017-02-22 DIAGNOSIS — E785 Hyperlipidemia, unspecified: Secondary | ICD-10-CM | POA: Diagnosis not present

## 2017-02-22 LAB — COMPREHENSIVE METABOLIC PANEL
ALBUMIN: 3.9 g/dL (ref 3.5–5.2)
ALT: 22 U/L (ref 0–35)
AST: 16 U/L (ref 0–37)
Alkaline Phosphatase: 64 U/L (ref 39–117)
BUN: 14 mg/dL (ref 6–23)
CHLORIDE: 103 meq/L (ref 96–112)
CO2: 31 mEq/L (ref 19–32)
CREATININE: 0.44 mg/dL (ref 0.40–1.20)
Calcium: 9.7 mg/dL (ref 8.4–10.5)
GFR: 152.9 mL/min (ref >60.00)
Glucose, Bld: 93 mg/dL (ref 70–99)
Potassium: 3.5 mEq/L (ref 3.5–5.1)
SODIUM: 140 meq/L (ref 135–145)
Total Bilirubin: 0.7 mg/dL (ref 0.2–1.2)
Total Protein: 6.7 g/dL (ref 6.0–8.3)

## 2017-02-22 LAB — LIPID PANEL
CHOLESTEROL: 191 mg/dL (ref 0–200)
HDL: 40.9 mg/dL (ref >39.00)
LDL CALC: 131 mg/dL — AB (ref 0–99)
NonHDL: 150.06
Total CHOL/HDL Ratio: 5
Triglycerides: 96 mg/dL (ref 0.0–149.0)
VLDL: 19.2 mg/dL (ref 0.0–40.0)

## 2017-02-22 MED ORDER — ATORVASTATIN CALCIUM 80 MG PO TABS: 80.0000 mg | ORAL_TABLET | Freq: Every day | ORAL | 0 refills | Status: DC

## 2017-02-22 NOTE — Telephone Encounter (Signed)
-----   Message from Mackie Pai, PA-C sent at 02/20/2017  9:22 AM EDT ----- Pt xray of elbow negative but still has symptoms and exam finding of tendinitis. So will refer pt to sports medicine.

## 2017-02-22 NOTE — Telephone Encounter (Signed)
Higher dose atorvastatin sent to pt pharmacy. If any side effects will keep her on 40 mg tabs and stress diet and exercise.

## 2017-02-22 NOTE — Telephone Encounter (Signed)
Pt was informed about her results and understood, (pt was informed about knee, elbow and shoulders). Pt already has an appt with sport medicine for 02-23-2017.

## 2017-02-23 ENCOUNTER — Ambulatory Visit (INDEPENDENT_AMBULATORY_CARE_PROVIDER_SITE_OTHER): Payer: Medicare Other | Admitting: Family Medicine

## 2017-02-23 ENCOUNTER — Ambulatory Visit: Payer: Medicare Other | Admitting: Family Medicine

## 2017-02-23 ENCOUNTER — Encounter: Payer: Self-pay | Admitting: Family Medicine

## 2017-02-23 DIAGNOSIS — M25561 Pain in right knee: Secondary | ICD-10-CM | POA: Diagnosis not present

## 2017-02-23 DIAGNOSIS — G8929 Other chronic pain: Secondary | ICD-10-CM | POA: Diagnosis not present

## 2017-02-23 DIAGNOSIS — M25562 Pain in left knee: Secondary | ICD-10-CM | POA: Diagnosis not present

## 2017-02-23 MED ORDER — METHYLPREDNISOLONE ACETATE 40 MG/ML IJ SUSP
40.0000 mg | Freq: Once | INTRAMUSCULAR | Status: AC
  Administered 2017-02-23: 40 mg via INTRA_ARTICULAR

## 2017-02-23 NOTE — Telephone Encounter (Signed)
Pt was informed about her lab results and understood and will pick up her new rx for Lipitor -atorvastatin at her pharmacy.

## 2017-02-23 NOTE — Telephone Encounter (Signed)
-----   Message from Mackie Pai, PA-C sent at 02/22/2017  9:04 PM EDT ----- Pt lipids are overall good but her cholesterol  has been consistently above 100. Ldl recently is 131(woudl like ldl to be less than 100). I will prescribe higher dose of lipitor. Repeat lipid panel in 3 months.

## 2017-02-23 NOTE — Patient Instructions (Signed)
Your pain is due to arthritis. These are the different medications you can take for this: Tylenol 500mg  1-2 tabs three times a day for pain. Capsaicin, aspercreme, or biofreeze topically up to four times a day may also help with pain. Some supplements that may help for arthritis: Boswellia extract, curcumin, pycnogenol Aleve 1-2 tabs twice a day with food Cortisone injections are an option - you were given these today. If cortisone injections do not help, there are different types of shots that may help but they take longer to take effect. It's important that you continue to stay active. Straight leg raises, knee extensions 3 sets of 10 once a day (add ankle weight if these become too easy). Do ankle strengthening exercises with theraband also 3 sets of 10 once a day. Consider physical therapy to strengthen muscles around the joint that hurts to take pressure off of the joint itself. Shoe inserts with good arch support may be helpful. Walker or cane if needed. Heat or ice 15 minutes at a time 3-4 times a day as needed to help with pain. Water aerobics and cycling with low resistance are the best two types of exercise for arthritis. Follow up with me in 1 month for reevaluation.

## 2017-02-25 DIAGNOSIS — M25562 Pain in left knee: Secondary | ICD-10-CM

## 2017-02-25 DIAGNOSIS — M25561 Pain in right knee: Secondary | ICD-10-CM | POA: Insufficient documentation

## 2017-02-25 NOTE — Progress Notes (Signed)
PCP and consultation requested by: Mackie Pai, PA-C  Subjective:   HPI: Patient is a 66 y.o. female here for bilateral knee pain.  Patient reports for about 3 months she's had worsening bilateral knee pain. No injury or trauma. She's had problems with these knees for about 9 years. Left knee worse than the right typically but right now pain is 8/10 and sharp in both. Worse with walking and in the morning. No skin changes, numbness. They will swell at times.  Past Medical History:  Diagnosis Date  . Breast cancer ([place]) 01/2009   left brest- status post mastectomy, intolerant to Arimidex, declined tamoxfien  . Gastric ulcer    CLO (-) per EGD ~ 11-2011  . Hyperlipidemia   . Hypertension 2006    Current Outpatient Prescriptions on File Prior to Visit  Medication Sig Dispense Refill  . acetaminophen (TYLENOL) 500 MG tablet Take 1 tablet (500 mg total) by mouth every 6 (six) hours as needed. 30 tablet 0  . atorvastatin (LIPITOR) 80 MG tablet Take 1 tablet (80 mg total) by mouth daily. 90 tablet 0  . cholecalciferol (VITAMIN D) 1000 UNITS tablet Take 1,000 Units by mouth daily.    . cyclobenzaprine (FLEXERIL) 10 MG tablet Take 10 mg by mouth 2 (two) times daily.    . fish oil-omega-3 fatty acids 1000 MG capsule Take 2 g by mouth daily.    Marland Kitchen lisinopril-hydrochlorothiazide (PRINZIDE,ZESTORETIC) 20-25 MG tablet Take 1 tablet by mouth daily. 90 tablet 1  . meloxicam (MOBIC) 7.5 MG tablet 1-2 tab po q day 30 tablet 1  . metoprolol succinate (TOPROL-XL) 50 MG 24 hr tablet TAKE ONE TABLET BY MOUTH ONCE DAILY. TAKE WITH OR IMMEDIATELY FOLLOWING A MEAL 90 tablet 1   No current facility-administered medications on file prior to visit.     Past Surgical History:  Procedure Laterality Date  . COLONOSCOPY W/ POLYPECTOMY  11/2011  . MASTECTOMY Left 04/2009    No Known Allergies  Social History   Social History  . Marital status: Married    Spouse name: N/A  . Number of children: 4   . Years of education: N/A   Occupational History  . [employer] spatial designer   Social History Main Topics  . Smoking status: Former Smoker    Quit date: 02/17/2007  . Smokeless tobacco: Never Used  . Alcohol use No  . Drug use: No  . Sexual activity: Not on file   Other Topics Concern  . Not on file   Social History Narrative   Original from [place] ---   Diet: very  healthy   Exercise- not frequently but active at work                Family History  Problem Relation Age of Onset  . Heart attack Father 81       F age 64, M age 13s  . Heart disease Father   . Diabetes Sister   . Diabetes Brother   . Diabetes Sister   . Heart disease Mother   . Diabetes Maternal Aunt   . Diabetes Maternal Uncle   . Diabetes Paternal Aunt   . Diabetes Paternal Uncle   . Diabetes Maternal Grandmother   . Diabetes Maternal Grandfather   . Diabetes Paternal Grandmother   . Diabetes Paternal Grandfather   . Stroke Neg Hx   . Colon cancer Neg Hx   . Breast cancer Neg Hx     BP Marland Kitchen)  152/80   Pulse 67   Ht 5\' 1"  (1.549 m)   Wt 165 lb (74.8 kg)   BMI 31.18 kg/m   Review of Systems: See HPI above.     Objective:  Physical Exam:  Gen: NAD, comfortable in exam room  Right knee: No gross deformity, ecchymoses, effusion. TTP medial joint line. FROM. Negative ant/post drawers. Negative valgus/varus testing. Negative lachmanns. Negative mcmurrays, apleys, patellar apprehension. NV intact distally.  Left knee: No gross deformity, ecchymoses, effusion. TTP medial joint line. FROM. Negative ant/post drawers. Negative valgus/varus testing. Negative lachmanns. Negative mcmurrays, apleys, patellar apprehension. NV intact distally.   Assessment & Plan:  1. Bilateral knee pain - 2/2 arthritis.  Independently reviewed radiographs from 7/20 and noted this as only finding primarily medially.  Discussed tylenol, topical medications, supplements, aleve.  Bilaterally  cortisone injections given today.  Ice or heat.  F/u in 1 month.  After informed written consent, patient was seated on exam table. Right knee was prepped with alcohol swab and utilizing anteromedial approach, patient's right knee was injected intraarticularly with 3:1 bupivicaine: depomedrol. Patient tolerated the procedure well without immediate complications.  After informed written consent, patient was seated on exam table. Left knee was prepped with alcohol swab and utilizing anteromedial approach, patient's left knee was injected intraarticularly with 3:1 bupivicaine: depomedrol. Patient tolerated the procedure well without immediate complications.

## 2017-02-25 NOTE — Assessment & Plan Note (Signed)
2/2 arthritis.  Independently reviewed radiographs from 7/20 and noted this as only finding primarily medially.  Discussed tylenol, topical medications, supplements, aleve.  Bilaterally cortisone injections given today.  Ice or heat.  F/u in 1 month.  After informed written consent, patient was seated on exam table. Right knee was prepped with alcohol swab and utilizing anteromedial approach, patient's right knee was injected intraarticularly with 3:1 bupivicaine: depomedrol. Patient tolerated the procedure well without immediate complications.  After informed written consent, patient was seated on exam table. Left knee was prepped with alcohol swab and utilizing anteromedial approach, patient's left knee was injected intraarticularly with 3:1 bupivicaine: depomedrol. Patient tolerated the procedure well without immediate complications.

## 2017-02-26 ENCOUNTER — Encounter: Payer: Self-pay | Admitting: Family Medicine

## 2017-02-26 ENCOUNTER — Ambulatory Visit (INDEPENDENT_AMBULATORY_CARE_PROVIDER_SITE_OTHER): Payer: Medicare Other | Admitting: Family Medicine

## 2017-02-26 DIAGNOSIS — M7712 Lateral epicondylitis, left elbow: Secondary | ICD-10-CM

## 2017-02-26 DIAGNOSIS — G8929 Other chronic pain: Secondary | ICD-10-CM

## 2017-02-26 DIAGNOSIS — M25512 Pain in left shoulder: Secondary | ICD-10-CM | POA: Diagnosis not present

## 2017-02-26 NOTE — Patient Instructions (Signed)
You have lateral epicondylitis of your elbow (tennis elbow). Try to avoid painful activities as much as possible. Ice the area 3-4 times a day for 15 minutes at a time. Aleve 2 tabs twice a day with food OR ibuprofen 3 tabs three times a day with food for pain and inflammation. Counterforce brace as directed can help unload area - wear this regularly if it provides you with relief. Hammer rotation exercise, wrist extension exercise with 1 pound weight - 3 sets of 10 once a day.   Stretching - hold for 20-30 seconds and repeat 3 times. Consider physical therapy, injection, nitro patches if not improving. Follow up in 6 weeks.  You have rotator cuff impingement Try to avoid painful activities (overhead activities, lifting with extended arm) as much as possible. Aleve 2 tabs twice a day with food OR ibuprofen 3 tabs three times a day with food for pain and inflammation. Can take tylenol in addition to this. Subacromial injection may be beneficial to help with pain and to decrease inflammation. Consider physical therapy with transition to home exercise program. Do home exercise program with theraband and scapular stabilization exercises daily 3 sets of 10 once a day. If not improving at follow-up we will consider further imaging, injection, physical therapy, and/or nitro patches. Follow up with me in 6 weeks.

## 2017-02-28 DIAGNOSIS — M7712 Lateral epicondylitis, left elbow: Secondary | ICD-10-CM | POA: Insufficient documentation

## 2017-02-28 DIAGNOSIS — M25512 Pain in left shoulder: Secondary | ICD-10-CM | POA: Insufficient documentation

## 2017-02-28 NOTE — Assessment & Plan Note (Signed)
Counterforce brace.  Reviewed home exercises and stretches to do daily.  Icing, aleve or ibuprofen.  Consider PT, injection, nitro if not improving.  F/u in 6 weeks.

## 2017-02-28 NOTE — Progress Notes (Addendum)
PCP: Mackie Pai, PA-C  Subjective:   HPI: Patient is a 66 y.o. female here for left shoulder and elbow pain.  Patient reports she's had lateral left shoulder pain for about 10 years. Seemed to improve following injections for knees which no longer hurt her since injections also. Reports shoulder hurt with overhead motions, reaching. Pain now 0/10 and mild soreness with these motions now. Also with lateral left elbow pain for about 5 months. Has not had any treatment for this. Pain worse with motions of elbow and wrist. Pain up to 2/10 and also a soreness. No skin changes, numbness.  Past Medical History:  Diagnosis Date  . Breast cancer ([place]) 01/2009   left brest- status post mastectomy, intolerant to Arimidex, declined tamoxfien  . Gastric ulcer    CLO (-) per EGD ~ 11-2011  . Hyperlipidemia   . Hypertension 2006    Current Outpatient Prescriptions on File Prior to Visit  Medication Sig Dispense Refill  . acetaminophen (TYLENOL) 500 MG tablet Take 1 tablet (500 mg total) by mouth every 6 (six) hours as needed. 30 tablet 0  . atorvastatin (LIPITOR) 80 MG tablet Take 1 tablet (80 mg total) by mouth daily. 90 tablet 0  . cholecalciferol (VITAMIN D) 1000 UNITS tablet Take 1,000 Units by mouth daily.    . cyclobenzaprine (FLEXERIL) 10 MG tablet Take 10 mg by mouth 2 (two) times daily.    . fish oil-omega-3 fatty acids 1000 MG capsule Take 2 g by mouth daily.    Marland Kitchen lisinopril-hydrochlorothiazide (PRINZIDE,ZESTORETIC) 20-25 MG tablet Take 1 tablet by mouth daily. 90 tablet 1  . meloxicam (MOBIC) 7.5 MG tablet 1-2 tab po q day 30 tablet 1  . metoprolol succinate (TOPROL-XL) 50 MG 24 hr tablet TAKE ONE TABLET BY MOUTH ONCE DAILY. TAKE WITH OR IMMEDIATELY FOLLOWING A MEAL 90 tablet 1   No current facility-administered medications on file prior to visit.     Past Surgical History:  Procedure Laterality Date  . COLONOSCOPY W/ POLYPECTOMY  11/2011  . MASTECTOMY Left 04/2009    No  Known Allergies  Social History   Social History  . Marital status: Married    Spouse name: N/A  . Number of children: 4  . Years of education: N/A   Occupational History  . [employer] spatial designer   Social History Main Topics  . Smoking status: Former Smoker    Quit date: 02/17/2007  . Smokeless tobacco: Never Used  . Alcohol use No  . Drug use: No  . Sexual activity: Not on file   Other Topics Concern  . Not on file   Social History Narrative   Original from [place] ---   Diet: very  healthy   Exercise- not frequently but active at work                Family History  Problem Relation Age of Onset  . Heart attack Father 71       F age 91, M age 74s  . Heart disease Father   . Diabetes Sister   . Diabetes Brother   . Diabetes Sister   . Heart disease Mother   . Diabetes Maternal Aunt   . Diabetes Maternal Uncle   . Diabetes Paternal Aunt   . Diabetes Paternal Uncle   . Diabetes Maternal Grandmother   . Diabetes Maternal Grandfather   . Diabetes Paternal Grandmother   . Diabetes Paternal Grandfather   . Stroke Neg  Hx   . Colon cancer Neg Hx   . Breast cancer Neg Hx     BP 124/79   Pulse 70   Ht 5\' 6"  (1.676 m)   Wt 166 lb (75.3 kg)   BMI 26.79 kg/m   Review of Systems: See HPI above.     Objective:  Physical Exam:  Gen: NAD, comfortable in exam room  Left shoulder: No swelling, ecchymoses.  No gross deformity. No TTP. FROM with minimal painful arc. Mild pain hawkins, negative neers. Negative Yergasons. Strength 5/5 with empty can and resisted internal/external rotation. Negative apprehension. NV intact distally.  Right shoulder: FROM without pain.  Left elbow: No swelling, deformity, bruising. TTP lateral epicondyle.  No other tenderness. FROM elbow and wrist.  No pain with wrist and elbow motions, 5/5 strength. Collateral ligaments intact. NVI distally.   Assessment & Plan:  1. Left elbow pain - 2/2 lateral  epicondylitis.  Counterforce brace.  Reviewed home exercises and stretches to do daily.  Icing, aleve or ibuprofen.  Consider PT, injection, nitro if not improving.  F/u in 6 weeks.  2. Left shoulder pain - 2/2 impingement.  Shown home exercises to do daily.  Aleve or ibuprofen.  Consider imaging, injection, PT, nitro patches if not improving.  F/u in 6 weeks.

## 2017-02-28 NOTE — Assessment & Plan Note (Signed)
2/2 impingement.  Improved following knee cortisone injections.  Shown home exercises to do daily.  Aleve or ibuprofen.  Consider imaging, injection, PT, nitro patches if not improving.  F/u in 6 weeks.

## 2017-03-26 ENCOUNTER — Ambulatory Visit: Payer: Medicare Other | Admitting: Family Medicine

## 2017-04-09 ENCOUNTER — Ambulatory Visit: Payer: Medicare Other | Admitting: Family Medicine

## 2017-04-12 ENCOUNTER — Ambulatory Visit (INDEPENDENT_AMBULATORY_CARE_PROVIDER_SITE_OTHER): Payer: Medicare Other | Admitting: Family Medicine

## 2017-04-12 ENCOUNTER — Encounter: Payer: Self-pay | Admitting: Family Medicine

## 2017-04-12 DIAGNOSIS — G8929 Other chronic pain: Secondary | ICD-10-CM | POA: Diagnosis not present

## 2017-04-12 DIAGNOSIS — M7712 Lateral epicondylitis, left elbow: Secondary | ICD-10-CM

## 2017-04-12 DIAGNOSIS — M25512 Pain in left shoulder: Secondary | ICD-10-CM

## 2017-04-12 NOTE — Progress Notes (Signed)
PCP: Mackie Pai, PA-C  Subjective:   HPI: Patient is a 66 y.o. female here for left shoulder and elbow pain.  7/27: Patient reports she's had lateral left shoulder pain for about 10 years. Seemed to improve following injections for knees which no longer hurt her since injections also. Reports shoulder hurt with overhead motions, reaching. Pain now 0/10 and mild soreness with these motions now. Also with lateral left elbow pain for about 5 months. Has not had any treatment for this. Pain worse with motions of elbow and wrist. Pain up to 2/10 and also a soreness. No skin changes, numbness.  9/10: Patient reports she's doing much better. Doing home exercises of shoulder three times a day and of the elbow. No pain of elbow now. No longer requiring medication, icing for elbow. Pain in left shoulder is soreness only when it's cold or raining. She takes tylenol when needed for shoulder. No skin changes, numbness.  Past Medical History:  Diagnosis Date  . Breast cancer ([place]) 01/2009   left brest- status post mastectomy, intolerant to Arimidex, declined tamoxfien  . Gastric ulcer    CLO (-) per EGD ~ 11-2011  . Hyperlipidemia   . Hypertension 2006    Current Outpatient Prescriptions on File Prior to Visit  Medication Sig Dispense Refill  . acetaminophen (TYLENOL) 500 MG tablet Take 1 tablet (500 mg total) by mouth every 6 (six) hours as needed. 30 tablet 0  . atorvastatin (LIPITOR) 80 MG tablet Take 1 tablet (80 mg total) by mouth daily. 90 tablet 0  . cholecalciferol (VITAMIN D) 1000 UNITS tablet Take 1,000 Units by mouth daily.    . cyclobenzaprine (FLEXERIL) 10 MG tablet Take 10 mg by mouth 2 (two) times daily.    . fish oil-omega-3 fatty acids 1000 MG capsule Take 2 g by mouth daily.    Marland Kitchen lisinopril-hydrochlorothiazide (PRINZIDE,ZESTORETIC) 20-25 MG tablet Take 1 tablet by mouth daily. 90 tablet 1  . meloxicam (MOBIC) 7.5 MG tablet 1-2 tab po q day 30 tablet 1  .  metoprolol succinate (TOPROL-XL) 50 MG 24 hr tablet TAKE ONE TABLET BY MOUTH ONCE DAILY. TAKE WITH OR IMMEDIATELY FOLLOWING A MEAL 90 tablet 1   No current facility-administered medications on file prior to visit.     Past Surgical History:  Procedure Laterality Date  . COLONOSCOPY W/ POLYPECTOMY  11/2011  . MASTECTOMY Left 04/2009    No Known Allergies  Social History   Social History  . Marital status: Married    Spouse name: N/A  . Number of children: 4  . Years of education: N/A   Occupational History  . [employer] designer   Social History Main Topics  . Smoking status: Former Smoker    Quit date: 02/17/2007  . Smokeless tobacco: Never Used  . Alcohol use No  . Drug use: No  . Sexual activity: Not on file   Other Topics Concern  . Not on file   Social History Narrative   Original from [place] ---   Diet: very  healthy   Exercise- not frequently but active at work                Family History  Problem Relation Age of Onset  . Heart attack Father 30       F age 54, M age 81s  . Heart disease Father   . Diabetes Sister   . Diabetes Brother   . Diabetes Sister   .  Heart disease Mother   . Diabetes Maternal Aunt   . Diabetes Maternal Uncle   . Diabetes Paternal Aunt   . Diabetes Paternal Uncle   . Diabetes Maternal Grandmother   . Diabetes Maternal Grandfather   . Diabetes Paternal Grandmother   . Diabetes Paternal Grandfather   . Stroke Neg Hx   . Colon cancer Neg Hx   . Breast cancer Neg Hx     BP 135/81   Pulse 67   Ht 5\' 6"  (1.676 m)   Wt 167 lb (75.8 kg)   BMI 26.95 kg/m   Review of Systems: See HPI above.     Objective:  Physical Exam:  Gen: NAD, comfortable in exam room  Left shoulder: No swelling, ecchymoses.  No gross deformity. No TTP. FROM with negative painful arc. Negative hawkins, negative neers. Negative Yergasons. Strength 5/5 with empty can and resisted internal/external rotation. Negative  apprehension. NV intact distally.  Right shoulder: FROM without pain.  Left elbow: No swelling, deformity, bruising. No TTP lateral epicondyle.  No other tenderness. FROM elbow and wrist.  No pain with wrist and elbow motions, 5/5 strength. Collateral ligaments intact. NVI distally.   Assessment & Plan:  1. Left elbow pain - 2/2 lateral epicondylitis.  Continue home exercises for 4 more weeks.  F/u prn.  2. Left shoulder pain - 2/2 impingement.  Much better with home exercises, tylenol.  Continue these for 4 more weeks.  Heat if needed.  F/u prn.

## 2017-04-12 NOTE — Assessment & Plan Note (Signed)
2/2 lateral epicondylitis.  Continue home exercises for 4 more weeks.  F/u prn.

## 2017-04-12 NOTE — Assessment & Plan Note (Signed)
2/2 impingement.  Much better with home exercises, tylenol.  Continue these for 4 more weeks.  Heat if needed.  F/u prn.

## 2017-04-12 NOTE — Patient Instructions (Signed)
Do the home exercises once a day for 4 more weeks. Heat 15 minutes at a time 3-4 times a day as needed for your shoulder. Try capsaicin topical cream (it's over the counter) up to 4 times a day as needed for your shoulder. Follow up with me as needed.

## 2017-04-21 DIAGNOSIS — H2513 Age-related nuclear cataract, bilateral: Secondary | ICD-10-CM | POA: Diagnosis not present

## 2017-05-21 ENCOUNTER — Other Ambulatory Visit: Payer: Self-pay | Admitting: Medical

## 2017-05-21 ENCOUNTER — Encounter: Payer: Self-pay | Admitting: Medical

## 2017-05-21 ENCOUNTER — Ambulatory Visit (INDEPENDENT_AMBULATORY_CARE_PROVIDER_SITE_OTHER): Payer: Medicare Other | Admitting: Medical

## 2017-05-21 ENCOUNTER — Ambulatory Visit (HOSPITAL_BASED_OUTPATIENT_CLINIC_OR_DEPARTMENT_OTHER)
Admission: RE | Admit: 2017-05-21 | Discharge: 2017-05-21 | Disposition: A | Discharge status: Home / Self Care | Payer: Medicare Other | Source: Ambulatory Visit | Attending: Medical | Admitting: Medical

## 2017-05-21 VITALS — BP 126/76 | HR 72 | Temp 97.9°F | Resp 16 | Ht 66.0 in | Wt 169.6 lb

## 2017-05-21 DIAGNOSIS — E785 Hyperlipidemia, unspecified: Secondary | ICD-10-CM

## 2017-05-21 DIAGNOSIS — M5136 Other intervertebral disc degeneration, lumbar region: Secondary | ICD-10-CM | POA: Insufficient documentation

## 2017-05-21 DIAGNOSIS — G4700 Insomnia, unspecified: Secondary | ICD-10-CM | POA: Diagnosis not present

## 2017-05-21 DIAGNOSIS — M5441 Lumbago with sciatica, right side: Secondary | ICD-10-CM | POA: Diagnosis not present

## 2017-05-21 DIAGNOSIS — Z23 Encounter for immunization: Secondary | ICD-10-CM

## 2017-05-21 DIAGNOSIS — I700 Atherosclerosis of aorta: Secondary | ICD-10-CM | POA: Insufficient documentation

## 2017-05-21 DIAGNOSIS — M25551 Pain in right hip: Secondary | ICD-10-CM

## 2017-05-21 DIAGNOSIS — M545 Low back pain: Secondary | ICD-10-CM | POA: Diagnosis not present

## 2017-05-21 MED ORDER — TRAZODONE HCL 50 MG PO TABS: 25.0000 mg | ORAL_TABLET | Freq: Every evening | ORAL | 3 refills | Status: DC | PRN

## 2017-05-21 MED ORDER — MELOXICAM 7.5 MG PO TABS: ORAL_TABLET | ORAL | 1 refills | Status: DC

## 2017-05-21 MED ORDER — LISINOPRIL-HYDROCHLOROTHIAZIDE 20-25 MG PO TABS: 1.0000 | ORAL_TABLET | Freq: Every day | ORAL | 1 refills | Status: DC

## 2017-05-21 NOTE — Progress Notes (Signed)
Subjective:    Patient ID: Jillian Chandler, female    DOB: 03/18/1951, 66 y.o.   MRN: [id number]  HPI  Pt in reporting for follow up.  Pt states pain lower back pain flare. When weather colder her back hurts.No recent xray of her back done. Pt pain is 3/10. Some pain from lower back toward her buttock region and hip. Pt in past when used meloxicam for other area of pain it did help.  Some hip pain on occasion rt side as well.  Pt needs refill of bp med. Her bp is good today.  Pt needs flu vaccine and she is willing to give.  Pt is not fasting but wanted to check lipid panel.  She has insomnia each night. States for 3 months.    Review of Systems  Constitutional: Negative for chills, fatigue and fever.  HENT: Negative for congestion, ear pain and postnasal drip.   Respiratory: Negative for cough, chest tightness, shortness of breath and wheezing.   Cardiovascular: Negative for chest pain and palpitations.  Gastrointestinal: Negative for abdominal pain, blood in stool, diarrhea and vomiting.  Genitourinary: Negative for dysuria, flank pain and frequency.  Musculoskeletal: Positive for back pain. Negative for joint swelling and neck stiffness.  Skin: Negative for rash.  Neurological: Negative for dizziness, speech difficulty, weakness and light-headedness.  Hematological: Negative for adenopathy. Does not bruise/bleed easily.  Psychiatric/Behavioral: Positive for sleep disturbance. Negative for behavioral problems, confusion and suicidal ideas. The patient is not nervous/anxious.     Past Medical History:  Diagnosis Date  . Breast cancer ([place]) 01/2009   left brest- status post mastectomy, intolerant to Arimidex, declined tamoxfien  . Gastric ulcer    CLO (-) per EGD ~ 11-2011  . Hyperlipidemia   . Hypertension 2006     Social History   Social History  . Marital status: Married    Spouse name: N/A  . Number of children: 4  . Years of education: N/A   Occupational  History  . [employer] and spatial designer   Social History Main Topics  . Smoking status: Former Smoker    Quit date: 02/17/2007  . Smokeless tobacco: Never Used  . Alcohol use No  . Drug use: No  . Sexual activity: Not on file   Other Topics Concern  . Not on file   Social History Narrative   Original from [place] ---   Diet: very  healthy   Exercise- not frequently but active at work                Past Surgical History:  Procedure Laterality Date  . COLONOSCOPY W/ POLYPECTOMY  11/2011  . MASTECTOMY Left 04/2009    Family History  Problem Relation Age of Onset  . Heart attack Father 74       F age 70, M age 49s  . Heart disease Father   . Diabetes Sister   . Diabetes Brother   . Diabetes Sister   . Heart disease Mother   . Diabetes Maternal Aunt   . Diabetes Maternal Uncle   . Diabetes Paternal Aunt   . Diabetes Paternal Uncle   . Diabetes Maternal Grandmother   . Diabetes Maternal Grandfather   . Diabetes Paternal Grandmother   . Diabetes Paternal Grandfather   . Stroke Neg Hx   . Colon cancer Neg Hx   . Breast cancer Neg Hx     No Known Allergies  Current Outpatient Prescriptions on File  Prior to Visit  Medication Sig Dispense Refill  . acetaminophen (TYLENOL) 500 MG tablet Take 1 tablet (500 mg total) by mouth every 6 (six) hours as needed. 30 tablet 0  . atorvastatin (LIPITOR) 80 MG tablet Take 1 tablet (80 mg total) by mouth daily. 90 tablet 0  . cholecalciferol (VITAMIN D) 1000 UNITS tablet Take 1,000 Units by mouth daily.    . cyclobenzaprine (FLEXERIL) 10 MG tablet Take 10 mg by mouth 2 (two) times daily.    . fish oil-omega-3 fatty acids 1000 MG capsule Take 2 g by mouth daily.    Marland Kitchen lisinopril-hydrochlorothiazide (PRINZIDE,ZESTORETIC) 20-25 MG tablet Take 1 tablet by mouth daily. 90 tablet 1  . meloxicam (MOBIC) 7.5 MG tablet 1-2 tab po q day 30 tablet 1  . metoprolol succinate (TOPROL-XL) 50 MG 24 hr tablet TAKE ONE TABLET BY MOUTH ONCE  DAILY. TAKE WITH OR IMMEDIATELY FOLLOWING A MEAL 90 tablet 1   No current facility-administered medications on file prior to visit.     BP 126/76   Pulse 72   Temp 97.9 F (36.6 C) (Oral)   Resp 16   Ht 5\' 6"  (1.676 m)   Wt 169 lb 9.6 oz (76.9 kg)   SpO2 98%   BMI 27.37 kg/m       Objective:   Physical Exam  General Appearance- Not in acute distress.    Chest and Lung Exam Auscultation: Breath sounds:-Normal. Clear even and unlabored. Adventitious sounds:- No Adventitious sounds.  Cardiovascular Auscultation:Rythm - Regular, rate and rythm. Heart Sounds -Normal heart sounds.  Abdomen Inspection:-Inspection Normal.  Palpation/Perucssion: Palpation and Percussion of the abdomen reveal- Non Tender, No Rebound tenderness, No rigidity(Guarding) and No Palpable abdominal masses.  Liver:-Normal.  Spleen:- Normal.   Back Mid lumbar spine tenderness to palpation. Pain on straight leg lift. Pain on lateral movements and flexion/extension of the spine.  Lower ext neurologic  L5-S1 sensation intact bilaterally. Normal patellar reflexes bilaterally. No foot drop bilaterally.  Rt hip- mild pain on range of motion and palpation.      Assessment & Plan:  For your recent low back pain,  I ordered lumbar spine x-ray today. I prescribed meloxicam again for you as that did help you in the past for other reasons of pain. You have some associated right hip region pain as well but this is less. I do think it is a good idea to go ahead and get x-ray of this area as well.  For hyperlipidemia, I want to repeat your lipid panel along with metabolic panel on Monday when you're fasting.  For insomnia prescribed trazodone.  Your blood pressure looks good today and I refilled your BP medication.  Follow-up date to be determined after x-ray and lab review.(As needed appointment as well)  Lafayette Dunlevy, Percell Miller, PA-C

## 2017-05-21 NOTE — Patient Instructions (Addendum)
For your recent low back pain,  I ordered lumbar spine x-ray today. I prescribed meloxicam again for you as that did help you in the past for other reasons of pain. You have some associated right hip region pain as well but this is less. I do think it is a good idea to go ahead and get x-ray of this area as well.  For hyperlipidemia, I want to repeat your lipid panel along with metabolic panel on Monday when you're fasting.  For insomnia prescribed trazodone.  Your blood pressure looks good today and I refilled your BP medication.  Follow-up date to be determined after x-ray and lab review.(As needed appointment as well)

## 2017-05-24 ENCOUNTER — Other Ambulatory Visit (INDEPENDENT_AMBULATORY_CARE_PROVIDER_SITE_OTHER): Payer: Medicare Other

## 2017-05-24 DIAGNOSIS — E785 Hyperlipidemia, unspecified: Secondary | ICD-10-CM

## 2017-05-24 DIAGNOSIS — I10 Essential (primary) hypertension: Secondary | ICD-10-CM | POA: Diagnosis not present

## 2017-05-24 LAB — COMPREHENSIVE METABOLIC PANEL
ALBUMIN: 3.9 g/dL (ref 3.5–5.2)
ALT: 22 U/L (ref 0–35)
AST: 17 U/L (ref 0–37)
Alkaline Phosphatase: 72 U/L (ref 39–117)
BUN: 16 mg/dL (ref 6–23)
CALCIUM: 9.6 mg/dL (ref 8.4–10.5)
CHLORIDE: 103 meq/L (ref 96–112)
CO2: 33 mEq/L — ABNORMAL HIGH (ref 19–32)
CREATININE: 0.47 mg/dL (ref 0.40–1.20)
GFR: 141.58 mL/min (ref >60.00)
Glucose, Bld: 108 mg/dL — ABNORMAL HIGH (ref 70–99)
POTASSIUM: 3.7 meq/L (ref 3.5–5.1)
SODIUM: 141 meq/L (ref 135–145)
Total Bilirubin: 0.7 mg/dL (ref 0.2–1.2)
Total Protein: 7 g/dL (ref 6.0–8.3)

## 2017-05-24 LAB — LIPID PANEL
CHOLESTEROL: 216 mg/dL — AB (ref 0–200)
HDL: 47.1 mg/dL (ref >39.00)
LDL CALC: 153 mg/dL — AB (ref 0–99)
NonHDL: 169.07
TRIGLYCERIDES: 79 mg/dL (ref 0.0–149.0)
Total CHOL/HDL Ratio: 5
VLDL: 15.8 mg/dL (ref 0.0–40.0)

## 2017-05-25 ENCOUNTER — Telehealth: Payer: Self-pay | Admitting: Medical

## 2017-05-25 NOTE — Telephone Encounter (Signed)
-----   Message from Mackie Pai, PA-C sent at 05/24/2017  9:48 PM EDT ----- Patient cholesterol increased recently. But on last lab result note it states I advised increasing her atorvastatin to 80 mg a day from 40 mg a day. Did she make this change. Need to know before advise her further. Trying to make since of why her cholesterol went up if she took higher dose atorvastatin? Sugar is mild elevated. Low sugar diet advised.

## 2017-05-25 NOTE — Telephone Encounter (Signed)
Pt was informed the below and understood, pt states did start taking atorvastatin 80 mg but it was causing her dizziness and had stop taking the medication atorvatatin. Pt is scheduled for 3 wk appt with provider on June 15, 2017. Pt was also informed about her xrays results also.

## 2017-06-15 ENCOUNTER — Ambulatory Visit (INDEPENDENT_AMBULATORY_CARE_PROVIDER_SITE_OTHER): Payer: Medicare Other | Admitting: Medical

## 2017-06-15 ENCOUNTER — Encounter: Payer: Self-pay | Admitting: Medical

## 2017-06-15 VITALS — BP 132/65 | HR 73 | Temp 98.2°F | Resp 16 | Ht 66.0 in | Wt 169.9 lb

## 2017-06-15 DIAGNOSIS — E785 Hyperlipidemia, unspecified: Secondary | ICD-10-CM | POA: Diagnosis not present

## 2017-06-15 DIAGNOSIS — R739 Hyperglycemia, unspecified: Secondary | ICD-10-CM

## 2017-06-15 DIAGNOSIS — M5126 Other intervertebral disc displacement, lumbar region: Secondary | ICD-10-CM | POA: Diagnosis not present

## 2017-06-15 DIAGNOSIS — M5416 Radiculopathy, lumbar region: Secondary | ICD-10-CM | POA: Diagnosis not present

## 2017-06-15 MED ORDER — ROSUVASTATIN CALCIUM 20 MG PO TABS: 20.0000 mg | ORAL_TABLET | Freq: Every day | ORAL | 0 refills | Status: DC

## 2017-06-15 NOTE — Progress Notes (Signed)
Subjective:    Patient ID: Jillian Chandler, female    DOB: 03-May-1951, 65 y.o.   MRN: [id number]  HPI  Pt in for follow up.  Pt states atorvastatin seemed to make her dizzy excessively. She states she used it for on month and felt like this so she stopped and symptotms subsided. No joint or muscle pain associated with statin. She wants to try something different.   Pt also states this am had 104. No hyperglycemic signs or symptoms.  Pt is still reporting pain in her lower back. Pain for years. Pt had pain in lower back that at times runs to her legs with some weakness. No numbness. At times pain in back radiates down both legs. Hx of l4 vertebral body abnormality seen on 2012 ct. Pt also states herniated disc lumbar area 15 years. Hx of breast cancer and radiologist recommended possibly getting mri  of lumbar area if had metastatic breast cancer history. Did have hx if breast ca with mastectomy.    Review of Systems  Constitutional: Negative for chills, fatigue and fever.  Respiratory: Negative for cough, chest tightness, shortness of breath and wheezing.   Cardiovascular: Negative for chest pain and palpitations.  Gastrointestinal: Negative for abdominal distention, abdominal pain, constipation, diarrhea and vomiting.  Musculoskeletal: Negative for back pain.       See hpi.  Skin: Negative for rash.  Neurological: Negative for dizziness, weakness, light-headedness and numbness.  Hematological: Negative for adenopathy. Does not bruise/bleed easily.  Psychiatric/Behavioral: Negative for behavioral problems and confusion.    Past Medical History:  Diagnosis Date  . Breast cancer ([place]) 01/2009   left brest- status post mastectomy, intolerant to Arimidex, declined tamoxfien  . Gastric ulcer    CLO (-) per EGD ~ 11-2011  . Hyperlipidemia   . Hypertension 2006     Social History   Socioeconomic History  . Marital status: Married    Spouse name: Not on file  . Number of children:  4  . Years of education: Not on file  . Highest education level: Not on file  Social Needs  . Financial resource strain: Not on file  . Food insecurity - worry: Not on file  . Food insecurity - inability: Not on file  . Transportation needs - medical: Not on file  . Transportation needs - non-medical: Not on file  Occupational History  . 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 Smoking status: Former Smoker    Last attempt to quit: 02/17/2007    Years since quitting: 10.3  . Smokeless tobacco: Never Used  Substance and Sexual Activity  . Alcohol use: No  . Drug use: No  . Sexual activity: Not on file  Other Topics Concern  . Not on file  Social History Narrative   Original from [place] ---   Diet: very  healthy   Exercise- not frequently but active at work                Past Surgical History:  Procedure Laterality Date  . COLONOSCOPY W/ POLYPECTOMY  11/2011  . MASTECTOMY Left 04/2009    Family History  Problem Relation Age of Onset  . Heart attack Father 36       F age 25, M age 74s  . Heart disease Father   . Diabetes Sister   . Diabetes Brother   . Diabetes Sister   . Heart disease Mother   . Diabetes Maternal Aunt   .  Diabetes Maternal Uncle   . Diabetes Paternal Aunt   . Diabetes Paternal Uncle   . Diabetes Maternal Grandmother   . Diabetes Maternal Grandfather   . Diabetes Paternal Grandmother   . Diabetes Paternal Grandfather   . Stroke Neg Hx   . Colon cancer Neg Hx   . Breast cancer Neg Hx     No Known Allergies  Current Outpatient Medications on File Prior to Visit  Medication Sig Dispense Refill  . acetaminophen (TYLENOL) 500 MG tablet Take 1 tablet (500 mg total) by mouth every 6 (six) hours as needed. 30 tablet 0  . atorvastatin (LIPITOR) 80 MG tablet Take 1 tablet (80 mg total) by mouth daily. 90 tablet 0  . cholecalciferol (VITAMIN D) 1000 UNITS tablet Take 1,000 Units by mouth daily.    . cyclobenzaprine  (FLEXERIL) 10 MG tablet Take 10 mg by mouth 2 (two) times daily.    . fish oil-omega-3 fatty acids 1000 MG capsule Take 2 g by mouth daily.    Marland Kitchen lisinopril-hydrochlorothiazide (PRINZIDE,ZESTORETIC) 20-25 MG tablet Take 1 tablet by mouth daily. 90 tablet 1  . meloxicam (MOBIC) 7.5 MG tablet 1-2 tab po q day 30 tablet 1  . metoprolol succinate (TOPROL-XL) 50 MG 24 hr tablet TAKE ONE TABLET BY MOUTH ONCE DAILY. TAKE WITH OR IMMEDIATELY FOLLOWING A MEAL 90 tablet 1  . traZODone (DESYREL) 50 MG tablet Take 0.5-1 tablets (25-50 mg total) by mouth at bedtime as needed for sleep. 30 tablet 3   No current facility-administered medications on file prior to visit.     BP 132/65   Pulse 73   Temp 98.2 F (36.8 C) (Oral)   Resp 16   Ht 5\' 6"  (1.676 m)   Wt 169 lb 14.4 oz (77.1 kg)   SpO2 96%   BMI 27.42 kg/m       Objective:   Physical Exam   General Mental Status- Alert. General Appearance- Not in acute distress.   Skin General: Color- Normal Color. Moisture- Normal Moisture.  Neck Carotid Arteries- Normal color. Moisture- Normal Moisture. No carotid bruits. No JVD.  Chest and Lung Exam Auscultation: Breath Sounds:-Normal.  Cardiovascular Auscultation:Rythm- Regular. Murmurs & Other Heart Sounds:Auscultation of the heart reveals- No Murmurs.  Abdomen Inspection:-Inspeection Normal. Palpation/Percussion:Note:No mass. Palpation and Percussion of the abdomen reveal- Non Tender, Non Distended + BS, no rebound or guarding. . Back Mid lumbar spine tenderness to palpation. Pain on straight leg lift. Pain on lateral movements and flexion/extension of the spine.  Lower ext neurologic  L5-S1 sensation intact bilaterally. Normal patellar reflexes bilaterally. No foot drop bilaterally.      Assessment & Plan:  For your history of high cholesterol and recent worsening levels, I will will prescribe Crestor 20 mg tablets.  Take 1 tab daily.  He reported some side effects that were  a little atypical with atorvastatin.  I do think it is worthwhile to try to switch to from atorvastatin to Crestor.  Crestor is usually a better medication for lowering cholesterol and typically has less side effects.  However if you do have any side effects please let us know.  For history of high sugar minimally continue low sugar diet and in early January we will repeat A1c.  Sugar averages are increasing would consider metformin.  For your history of low back pain with radiating features and history of breast cancer I do think it would be beneficial to get MRI of the lumbar spine.  We will try to  schedule that and have Geni Bers call you.  For intermittent back pain can continue meloxicam.  Follow-up in mid January or as needed.  Taya Ashbaugh, Percell Miller, PA-C

## 2017-06-15 NOTE — Patient Instructions (Signed)
For your history of high cholesterol and recent worsening levels, I will will prescribe Crestor 20 mg tablets.  Take 1 tab daily.  He reported some side effects that were a little atypical with atorvastatin.  I do think it is worthwhile to try to switch to from atorvastatin to Crestor.  Crestor is usually a better medication for lowering cholesterol and typically has less side effects.  However if you do have any side effects please let us know.  For history of high sugar minimally continue low sugar diet and in early January we will repeat A1c.  Sugar averages are increasing would consider metformin.  For your history of low back pain with radiating features and history of breast cancer I do think it would be beneficial to get MRI of the lumbar spine.  We will try to schedule that and have Geni Bers call you.  For intermittent back pain can continue meloxicam.  Follow-up in mid January or as needed.

## 2017-08-06 ENCOUNTER — Telehealth: Payer: Self-pay | Admitting: Medical

## 2017-08-06 NOTE — Telephone Encounter (Signed)
Forwarding message to correct office

## 2017-08-06 NOTE — Telephone Encounter (Signed)
Copied from [place] [id number]. Topic: Quick Communication - See Telephone Encounter >> Aug 06, 2017  8:23 AM Rosalin Hawking wrote: CRM for notification. See Telephone encounter for:  08/06/17.   Pt is needing refill on [facility] Blood Glucose Test Strips for 90 days. Please advise.

## 2017-08-17 ENCOUNTER — Encounter: Payer: Self-pay | Admitting: Medical

## 2017-08-17 ENCOUNTER — Ambulatory Visit (INDEPENDENT_AMBULATORY_CARE_PROVIDER_SITE_OTHER): Payer: Medicare Other | Admitting: Medical

## 2017-08-17 VITALS — BP 117/56 | HR 62 | Temp 98.3°F | Resp 16 | Ht 66.0 in | Wt 164.0 lb

## 2017-08-17 DIAGNOSIS — R739 Hyperglycemia, unspecified: Secondary | ICD-10-CM

## 2017-08-17 DIAGNOSIS — J309 Allergic rhinitis, unspecified: Secondary | ICD-10-CM | POA: Diagnosis not present

## 2017-08-17 DIAGNOSIS — E785 Hyperlipidemia, unspecified: Secondary | ICD-10-CM

## 2017-08-17 DIAGNOSIS — I10 Essential (primary) hypertension: Secondary | ICD-10-CM | POA: Diagnosis not present

## 2017-08-17 DIAGNOSIS — G4700 Insomnia, unspecified: Secondary | ICD-10-CM

## 2017-08-17 LAB — COMPREHENSIVE METABOLIC PANEL
ALBUMIN: 4.1 g/dL (ref 3.5–5.2)
ALT: 15 U/L (ref 0–35)
AST: 13 U/L (ref 0–37)
Alkaline Phosphatase: 68 U/L (ref 39–117)
BUN: 13 mg/dL (ref 6–23)
CHLORIDE: 102 meq/L (ref 96–112)
CO2: 34 meq/L — AB (ref 19–32)
CREATININE: 0.48 mg/dL (ref 0.40–1.20)
Calcium: 9.7 mg/dL (ref 8.4–10.5)
GFR: 138.08 mL/min (ref >60.00)
GLUCOSE: 110 mg/dL — AB (ref 70–99)
Potassium: 4.7 mEq/L (ref 3.5–5.1)
SODIUM: 140 meq/L (ref 135–145)
Total Bilirubin: 0.5 mg/dL (ref 0.2–1.2)
Total Protein: 7.2 g/dL (ref 6.0–8.3)

## 2017-08-17 LAB — LIPID PANEL
CHOL/HDL RATIO: 3
CHOLESTEROL: 173 mg/dL (ref 0–200)
HDL: 50.7 mg/dL (ref >39.00)
LDL CALC: 104 mg/dL — AB (ref 0–99)
NonHDL: 122.37
Triglycerides: 94 mg/dL (ref 0.0–149.0)
VLDL: 18.8 mg/dL (ref 0.0–40.0)

## 2017-08-17 LAB — HEMOGLOBIN A1C: HEMOGLOBIN A1C: 5.9 % (ref 4.6–6.5)

## 2017-08-17 MED ORDER — FLUTICASONE PROPIONATE 50 MCG/ACT NA SUSP: 2.0000 | Freq: Every day | NASAL | 1 refills | Status: DC

## 2017-08-17 MED ORDER — LEVOCETIRIZINE DIHYDROCHLORIDE 5 MG PO TABS: 5.0000 mg | ORAL_TABLET | Freq: Every evening | ORAL | 0 refills | Status: DC

## 2017-08-17 NOTE — Patient Instructions (Signed)
For your high cholesterol history, we will check fasting lipid panel today.  Since she reports some side effects with 80 mg atorvastatin I might recommend restarting back at 40 mg and try to find other combinations of medications to control your lipids.  We will notify you when labs are back.  Your blood pressure is well controlled today and no change in treatment plan for BP.  For insomnia controlled with trazodone, I would advise continuing the trazodone.  For probable allergic rhinitis, I am prescribing Xyzal antihistamine and Flonase nasal spray.  If you develop more symptoms consistent with sinus infection please let us know and I would call you in an antibiotic.  For high sugar history we will get A1c today as well as metabolic panel.  Follow-up in 3 months or as needed.

## 2017-08-17 NOTE — Progress Notes (Signed)
Subjective:    Patient ID: Jillian Chandler, female    DOB: [birth year], 67 y.o.   MRN: [id number]  HPI  Pt htn controlled. Her bp well controlled when she checks at home. Pt on prinzide and toprol xl.  Pt had high cholesterol. She stopped lipitor 2 weeks ago. She stopped tablets due to percieved dizzy side effects. Dizziness stopped when med stopped and has not returned. She had increased her dose lipitor from  40 mg to 80 mg dose for about 2 and half months.  Pt has insomnia on last visit. I rx'd trazadone and she stats helped her sleep.  Pt recently has been sneezing, dry cough and nasal congestion. No fever, non chills or sweats. She thinks allergies. These symptoms present for one week.   Review of Systems  Constitutional: Negative for chills, fatigue and fever.  HENT: Positive for congestion, postnasal drip and sneezing. Negative for rhinorrhea, sinus pressure, sore throat, tinnitus and trouble swallowing.   Respiratory: Negative for cough, chest tightness, shortness of breath and wheezing.   Cardiovascular: Negative for chest pain and palpitations.  Gastrointestinal: Negative for abdominal pain, anal bleeding, blood in stool, constipation and diarrhea.  Musculoskeletal: Negative for arthralgias, back pain, gait problem, neck pain and neck stiffness.  Skin: Negative for pallor and rash.  Neurological: Negative for dizziness, seizures, speech difficulty, weakness, light-headedness and headaches.  Hematological: Negative for adenopathy. Does not bruise/bleed easily.  Psychiatric/Behavioral: Negative for behavioral problems and confusion.   Past Medical History:  Diagnosis Date  . Breast cancer ([place]) 01/2009   left brest- status post mastectomy, intolerant to Arimidex, declined tamoxfien  . Gastric ulcer    CLO (-) per EGD ~ 11-2011  . Hyperlipidemia   . Hypertension 2006     Social History   Socioeconomic History  . Marital status: Married    Spouse name: Not on file  .  Number of children: 4  . Years of education: Not on file  . Highest education level: Not on file  Social Needs  . Financial resource strain: Not on file  . Food insecurity - worry: Not on file  . Food insecurity - inability: Not on file  . Transportation needs - medical: Not on file  . Transportation needs - non-medical: Not on file  Occupational History  . 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 Smoking status: Former Smoker    Last attempt to quit: 02/17/2007    Years since quitting: 10.5  . Smokeless tobacco: Never Used  Substance and Sexual Activity  . Alcohol use: No  . Drug use: No  . Sexual activity: Not on file  Other Topics Concern  . Not on file  Social History Narrative   Original from [place] ---   Diet: very  healthy   Exercise- not frequently but active at work                Past Surgical History:  Procedure Laterality Date  . COLONOSCOPY W/ POLYPECTOMY  11/2011  . MASTECTOMY Left 04/2009    Family History  Problem Relation Age of Onset  . Heart attack Father 60       F age 33, M age 59s  . Heart disease Father   . Diabetes Sister   . Diabetes Brother   . Diabetes Sister   . Heart disease Mother   . Diabetes Maternal Aunt   . Diabetes Maternal Uncle   . Diabetes Paternal Aunt   .  Diabetes Paternal Uncle   . Diabetes Maternal Grandmother   . Diabetes Maternal Grandfather   . Diabetes Paternal Grandmother   . Diabetes Paternal Grandfather   . Stroke Neg Hx   . Colon cancer Neg Hx   . Breast cancer Neg Hx     No Known Allergies  Current Outpatient Medications on File Prior to Visit  Medication Sig Dispense Refill  . acetaminophen (TYLENOL) 500 MG tablet Take 1 tablet (500 mg total) by mouth every 6 (six) hours as needed. 30 tablet 0  . b complex vitamins capsule Take 1 capsule by mouth daily.    . cholecalciferol (VITAMIN D) 1000 UNITS tablet Take 1,000 Units by mouth daily.    . cyclobenzaprine (FLEXERIL)  10 MG tablet Take 10 mg by mouth 2 (two) times daily.    . fish oil-omega-3 fatty acids 1000 MG capsule Take 2 g by mouth daily.    Marland Kitchen lisinopril-hydrochlorothiazide (PRINZIDE,ZESTORETIC) 20-25 MG tablet Take 1 tablet by mouth daily. 90 tablet 1  . meloxicam (MOBIC) 7.5 MG tablet 1-2 tab po q day 30 tablet 1  . metoprolol succinate (TOPROL-XL) 50 MG 24 hr tablet TAKE ONE TABLET BY MOUTH ONCE DAILY. TAKE WITH OR IMMEDIATELY FOLLOWING A MEAL 90 tablet 1  . rosuvastatin (CRESTOR) 20 MG tablet Take 1 tablet (20 mg total) daily by mouth. 90 tablet 0  . traZODone (DESYREL) 50 MG tablet Take 0.5-1 tablets (25-50 mg total) by mouth at bedtime as needed for sleep. 30 tablet 3   No current facility-administered medications on file prior to visit.     BP (!) 117/56 (BP Location: Right Arm, Patient Position: Sitting, Cuff Size: Small)   Pulse 62   Temp 98.3 F (36.8 C) (Oral)   Resp 16   Ht 5\' 6"  (1.676 m)   Wt 164 lb (74.4 kg)   SpO2 100%   BMI 26.47 kg/m       Objective:   Physical Exam  General  Mental Status - Alert. General Appearance - Well groomed. Not in acute distress.  Skin Rashes- No Rashes.  HEENT Head- Normal. Ear Auditory Canal - Left- Normal. Right - Normal.Tympanic Membrane- Left- Normal. Right- Normal. Eye Sclera/Conjunctiva- Left- Normal. Right- Normal. Nose & Sinuses Nasal Mucosa- Left-  Boggy and Congested. Right-  Boggy and  Congested.Bilateral no  maxillary and  No frontal sinus pressure. Mouth & Throat Lips: Upper Lip- Normal: no dryness, cracking, pallor, cyanosis, or vesicular eruption. Lower Lip-Normal: no dryness, cracking, pallor, cyanosis or vesicular eruption. Buccal Mucosa- Bilateral- No Aphthous ulcers. Oropharynx- No Discharge or Erythema. Tonsils: Characteristics- Bilateral- No Erythema or Congestion. Size/Enlargement- Bilateral- No enlargement. Discharge- bilateral-None.  Neck Neck- Supple. No Masses.   Chest and Lung Exam Auscultation: Breath  Sounds:-Clear even and unlabored.  Cardiovascular Auscultation:Rythm- Regular, rate and rhythm. Murmurs & Other Heart Sounds:Ausculatation of the heart reveal- No Murmurs.  Lymphatic Head & Neck General Head & Neck Lymphatics: Bilateral: Description- No Localized lymphadenopathy.  Neuro- CN III- XII grossly intact.       Assessment & Plan:  For your high cholesterol history, we will check fasting lipid panel today.  Since she reports some side effects with 80 mg atorvastatin I might recommend restarting back at 40 mg and try to find other combinations of medications to control your lipids.  We will notify you when labs are back.  Your blood pressure is well controlled today and no change in treatment plan for BP.  For insomnia controlled with trazodone, I  would advise continuing the trazodone.  For probable allergic rhinitis, I am prescribing Xyzal antihistamine and Flonase nasal spray.  If you develop more symptoms consistent with sinus infection please let us know and I would call you in an antibiotic.  For high sugar history we will get A1c today as well as metabolic panel.  Follow-up in 3 months or as needed.

## 2017-08-18 ENCOUNTER — Telehealth: Payer: Self-pay | Admitting: Medical

## 2017-08-18 NOTE — Telephone Encounter (Signed)
-----   Message from Mackie Pai, PA-C sent at 08/17/2017  6:51 PM EST ----- The lab showed good kidney function and normal liver enzymes.  Sugar at the time of the lab was a little high at 110.  But patient's A1c was 5.9.  So her blood sugar average is probably approximately 110.  Recommend that she continue low sugar diet.  Also her lipid panel was high 2 months ago but now improved overall.  She had reported side effects with atorvastatin 80 mg.  Side effects seem minimal and atypical.  She was reporting transient dizziness.  She thought it was dose dependent and she never had this with 40 mg tablets.  So advised patient that she can go back on the lower dose.  Would you make sure she has adequate atorvastatin 40 mg.  If she needs a refill please let me know.  Follow-up in 3 months or as needed.

## 2017-08-18 NOTE — Telephone Encounter (Signed)
Called pt twice and states VM full, waiting for pt to return call.

## 2017-08-30 ENCOUNTER — Other Ambulatory Visit: Payer: Self-pay

## 2017-08-30 ENCOUNTER — Other Ambulatory Visit: Payer: Self-pay | Admitting: Medical

## 2017-08-30 DIAGNOSIS — E785 Hyperlipidemia, unspecified: Secondary | ICD-10-CM

## 2017-08-30 MED ORDER — ATORVASTATIN CALCIUM 40 MG PO TABS: 40.0000 mg | ORAL_TABLET | Freq: Every day | ORAL | 3 refills | Status: DC

## 2017-08-30 NOTE — Progress Notes (Signed)
atorvas ?

## 2017-10-12 ENCOUNTER — Ambulatory Visit: Payer: Medicare Other | Admitting: Medical

## 2017-10-14 ENCOUNTER — Ambulatory Visit (INDEPENDENT_AMBULATORY_CARE_PROVIDER_SITE_OTHER): Payer: Medicare Other | Admitting: Medical

## 2017-10-14 ENCOUNTER — Other Ambulatory Visit: Payer: Self-pay | Admitting: Medical

## 2017-10-14 ENCOUNTER — Encounter: Payer: Self-pay | Admitting: Medical

## 2017-10-14 ENCOUNTER — Ambulatory Visit (HOSPITAL_BASED_OUTPATIENT_CLINIC_OR_DEPARTMENT_OTHER)
Admission: RE | Admit: 2017-10-14 | Discharge: 2017-10-14 | Disposition: A | Discharge status: Home / Self Care | Payer: Medicare Other | Source: Ambulatory Visit | Attending: Medical | Admitting: Medical

## 2017-10-14 VITALS — BP 140/73 | HR 62 | Resp 16 | Ht 66.0 in | Wt 162.8 lb

## 2017-10-14 DIAGNOSIS — M47896 Other spondylosis, lumbar region: Secondary | ICD-10-CM | POA: Insufficient documentation

## 2017-10-14 DIAGNOSIS — G5601 Carpal tunnel syndrome, right upper limb: Secondary | ICD-10-CM | POA: Diagnosis not present

## 2017-10-14 DIAGNOSIS — M24832 Other specific joint derangements of left wrist, not elsewhere classified: Secondary | ICD-10-CM | POA: Diagnosis not present

## 2017-10-14 DIAGNOSIS — M24852 Other specific joint derangements of left hip, not elsewhere classified: Secondary | ICD-10-CM | POA: Diagnosis not present

## 2017-10-14 DIAGNOSIS — M24851 Other specific joint derangements of right hip, not elsewhere classified: Secondary | ICD-10-CM | POA: Insufficient documentation

## 2017-10-14 DIAGNOSIS — S79911A Unspecified injury of right hip, initial encounter: Secondary | ICD-10-CM | POA: Diagnosis not present

## 2017-10-14 DIAGNOSIS — M25552 Pain in left hip: Secondary | ICD-10-CM

## 2017-10-14 DIAGNOSIS — M25562 Pain in left knee: Secondary | ICD-10-CM

## 2017-10-14 DIAGNOSIS — M25551 Pain in right hip: Secondary | ICD-10-CM | POA: Diagnosis present

## 2017-10-14 DIAGNOSIS — M1712 Unilateral primary osteoarthritis, left knee: Secondary | ICD-10-CM | POA: Insufficient documentation

## 2017-10-14 DIAGNOSIS — M25531 Pain in right wrist: Secondary | ICD-10-CM

## 2017-10-14 DIAGNOSIS — M19031 Primary osteoarthritis, right wrist: Secondary | ICD-10-CM | POA: Diagnosis not present

## 2017-10-14 MED ORDER — MELOXICAM 15 MG PO TABS: 15.0000 mg | ORAL_TABLET | Freq: Every day | ORAL | 0 refills | Status: DC

## 2017-10-14 NOTE — Progress Notes (Signed)
Subjective:    Patient ID: Jillian Chandler, female    DOB: 1950/11/22, 67 y.o.   MRN: [id number] ............................................................................................................................................................................................................................................................................................. HPI  Pt in with hand numbness to rt hand for 3 weeks. Numbness for 3 weeks. Today numbness is less. Feels like it is tingling.  At times can't sleep due to numbness. No neck pain. Pt not working. Pt is left handed.  Pt also states some rt hip pain on and off for 6 month. No accident. Pain is constant and daily.  Also pt has left knee since fall in December on ice. Some pain prior but worse now.  Pt is using meloxicam 7.5 mg a day.     Review of Systems  Constitutional: Negative for chills, fatigue and fever.  Respiratory: Negative for cough, choking and chest tightness.   Cardiovascular: Negative for chest pain, palpitations and leg swelling.  Gastrointestinal: Negative for abdominal pain, blood in stool and constipation.  Musculoskeletal:       See hpi.  Skin: Negative for rash.  Neurological: Negative for dizziness, speech difficulty, weakness and headaches.  Hematological: Negative for adenopathy. Does not bruise/bleed easily.  Psychiatric/Behavioral: Negative for behavioral problems and confusion.    Past Medical History:  Diagnosis Date  . Breast cancer ([place]) 01/2009   left brest- status post mastectomy, intolerant to Arimidex, declined tamoxfien  . Gastric ulcer    CLO (-) per EGD ~ 11-2011  . Hyperlipidemia   . Hypertension 2006     Social History   Socioeconomic History  . Marital status: Married    Spouse name: Not on file  . Number of children: 4  . Years of education: Not on file  . Highest education level: Not on file  Social Needs  . Financial resource strain: Not on file  .  Food insecurity - worry: Not on file  . Food insecurity - inability: Not on file  . Transportation needs - medical: Not on file  . Transportation needs - non-medical: Not on file  Occupational History  . 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 Smoking status: Former Smoker    Last attempt to quit: 02/17/2007    Years since quitting: 10.6  . Smokeless tobacco: Never Used  Substance and Sexual Activity  . Alcohol use: No  . Drug use: No  . Sexual activity: Not on file  Other Topics Concern  . Not on file  Social History Narrative   Original from [place] ---   Diet: very  healthy   Exercise- not frequently but active at work                Past Surgical History:  Procedure Laterality Date  . COLONOSCOPY W/ POLYPECTOMY  11/2011  . MASTECTOMY Left 04/2009    Family History  Problem Relation Age of Onset  . Heart attack Father 38       F age 59, M age 105s  . Heart disease Father   . Diabetes Sister   . Diabetes Brother   . Diabetes Sister   . Heart disease Mother   . Diabetes Maternal Aunt   . Diabetes Maternal Uncle   . Diabetes Paternal Aunt   . Diabetes Paternal Uncle   . Diabetes Maternal Grandmother   . Diabetes Maternal Grandfather   . Diabetes Paternal Grandmother   . Diabetes Paternal Grandfather   . Stroke Neg Hx   . Colon cancer Neg Hx   . Breast cancer Neg Hx  No Known Allergies  Current Outpatient Medications on File Prior to Visit  Medication Sig Dispense Refill  . acetaminophen (TYLENOL) 500 MG tablet Take 1 tablet (500 mg total) by mouth every 6 (six) hours as needed. 30 tablet 0  . atorvastatin (LIPITOR) 40 MG tablet Take 1 tablet (40 mg total) by mouth daily. 90 tablet 3  . b complex vitamins capsule Take 1 capsule by mouth daily.    . cholecalciferol (VITAMIN D) 1000 UNITS tablet Take 1,000 Units by mouth daily.    . cyclobenzaprine (FLEXERIL) 10 MG tablet Take 10 mg by mouth 2 (two) times daily.    . fish  oil-omega-3 fatty acids 1000 MG capsule Take 2 g by mouth daily.    . fluticasone (FLONASE) 50 MCG/ACT nasal spray Place 2 sprays into both nostrils daily. 16 g 1  . levocetirizine (XYZAL) 5 MG tablet Take 1 tablet (5 mg total) by mouth every evening. 30 tablet 0  . lisinopril-hydrochlorothiazide (PRINZIDE,ZESTORETIC) 20-25 MG tablet Take 1 tablet by mouth daily. 90 tablet 1  . meloxicam (MOBIC) 7.5 MG tablet 1-2 tab po q day 30 tablet 1  . metoprolol succinate (TOPROL-XL) 50 MG 24 hr tablet TAKE 1 TABLET BY MOUTH ONCE DAILY.TAKE WITH OR IMMEDIATELY FOLLOWING A MEAL 90 tablet 1  . rosuvastatin (CRESTOR) 20 MG tablet Take 1 tablet (20 mg total) daily by mouth. 90 tablet 0  . traZODone (DESYREL) 50 MG tablet Take 0.5-1 tablets (25-50 mg total) by mouth at bedtime as needed for sleep. 30 tablet 3   No current facility-administered medications on file prior to visit.     BP 140/73 (BP Location: Right Arm, Patient Position: Sitting, Cuff Size: Normal)   Pulse 62   Resp 16   Ht 5\' 6"  (1.676 m)   Wt 162 lb 12.8 oz (73.8 kg)   SpO2 99%   BMI 26.28 kg/m       Objective:   Physical Exam  General- No acute distress. Pleasant patient. Neck- Full range of motion, no jvd Lungs- Clear, even and unlabored. Heart- regular rate and rhythm. Neurologic- CNII- XII grossly intact.   Rt upper ext- negative phalens sign. Good grip  Left knee- good range of motion. Lateral tibial plateau region tenderness to palpation modeate to severe. No insttability. On flexion and extension severe pain.  Rt hip- very tender on range of motion.      Assessment & Plan:  You do appear to have probable right side carpal tunnel syndrome.  I am writing meloxicam at a higher dose of 15 mg to take daily.  Also recommended she get a wrist cock-up splint over-the-counter and use daily as well.  If your symptoms do not subside then may need to refer you to orthopedist/hand specialist.  For left knee pain, we will need to  get x-ray to evaluate the knee joint in light of the recent trauma/accident in December.  Rule out fracture but by exam have concern that she might have a meniscus injury.  For left hip pain, I ordered  x-ray.  Want to assess your joint space.  If decreased joint space appears to be a problem may refer to orthopedist.  His joint space appears on the normal side then might refer to sports medicine.  Follow-up date to be determined after review of x-rays.  Mackie Pai, PA-C

## 2017-10-14 NOTE — Patient Instructions (Signed)
You do appear to have probable right side carpal tunnel syndrome.  I am writing meloxicam at a higher dose of 15 mg to take daily.  Also recommended she get a wrist cock-up splint over-the-counter and use daily as well.  If your symptoms do not subside then may need to refer you to orthopedist/hand specialist.  For left knee pain, we will need to get x-ray to evaluate the knee joint in light of the recent trauma/accident in December.  Rule out fracture but by exam have concern that she might have a meniscus injury.  For left hip pain, I ordered  x-ray.  Want to assess your joint space.  If decreased joint space appears to be a problem may refer to orthopedist.  His joint space appears on the normal side then might refer to sports medicine.  Follow-up date to be determined after review of x-rays.

## 2017-11-09 ENCOUNTER — Ambulatory Visit (INDEPENDENT_AMBULATORY_CARE_PROVIDER_SITE_OTHER): Payer: Medicare Other | Admitting: Family

## 2017-11-09 ENCOUNTER — Encounter: Payer: Self-pay | Admitting: Family

## 2017-11-09 ENCOUNTER — Ambulatory Visit (HOSPITAL_BASED_OUTPATIENT_CLINIC_OR_DEPARTMENT_OTHER)
Admission: RE | Admit: 2017-11-09 | Discharge: 2017-11-09 | Disposition: A | Discharge status: Home / Self Care | Payer: Medicare Other | Source: Ambulatory Visit | Attending: Family | Admitting: Family

## 2017-11-09 VITALS — BP 120/70 | HR 71 | Temp 98.4°F | Resp 16 | Ht 66.0 in | Wt 162.2 lb

## 2017-11-09 DIAGNOSIS — N83202 Unspecified ovarian cyst, left side: Secondary | ICD-10-CM | POA: Insufficient documentation

## 2017-11-09 DIAGNOSIS — R300 Dysuria: Secondary | ICD-10-CM

## 2017-11-09 DIAGNOSIS — R102 Pelvic and perineal pain: Secondary | ICD-10-CM | POA: Insufficient documentation

## 2017-11-09 LAB — POC URINALSYSI DIPSTICK (AUTOMATED)
BILIRUBIN UA: NEGATIVE
Glucose, UA: NEGATIVE
Ketones, UA: NEGATIVE
Leukocytes, UA: NEGATIVE
NITRITE UA: NEGATIVE
Protein, UA: NEGATIVE
RBC UA: NEGATIVE
Urobilinogen, UA: 0.2 E.U./dL
pH, UA: 6 (ref 5.0–8.0)

## 2017-11-09 MED ORDER — CEPHALEXIN 500 MG PO CAPS: 500.0000 mg | ORAL_CAPSULE | Freq: Two times a day (BID) | ORAL | 0 refills | Status: DC

## 2017-11-09 NOTE — Patient Instructions (Signed)
Please begin keflex for possible urinary tract infection- urinalysis looks ok but we will send for culture. I would like you to have a pelvic ultrasound. I think your uterus may be mildly enlarged. Call if new/worsening symptoms or if you develop fever.  Go to ER if you develop severe abdominal pain. Call if symptoms are not improved in 2-3 days.

## 2017-11-09 NOTE — Progress Notes (Signed)
Subjective:    Patient ID: Jillian Chandler, female    DOB: 11-18-1950, 67 y.o.   MRN: [id number]  HPI  Jillian Chandler is a 67 yr old female who presents today with chief complaint of dyrusia.  Reports urinary frequency.  Started last Saturday. Has suprapubic pain.  Denies fever.  Did have chills.  Felt very sleepy on Sunday.  Reports that she has not been sexually active in 10 years. Denies vaginal discharge.  Reports that BM's are generally normal.   Review of Systems See HPI  Past Medical History:  Diagnosis Date  . Breast cancer ([place]) 01/2009   left brest- status post mastectomy, intolerant to Arimidex, declined tamoxfien  . Gastric ulcer    CLO (-) per EGD ~ 11-2011  . Hyperlipidemia   . Hypertension 2006     Social History   Socioeconomic History  . Marital status: Married    Spouse name: Not on file  . Number of children: 4  . Years of education: Not on file  . Highest education level: Not on file  Occupational History  . 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 Financial resource strain: Not on file  . Food insecurity:    Worry: Not on file    Inability: Not on file  . Transportation needs:    Medical: Not on file    Non-medical: Not on file  Tobacco Use  . Smoking status: Former Smoker    Last attempt to quit: 02/17/2007    Years since quitting: 10.7  . Smokeless tobacco: Never Used  Substance and Sexual Activity  . Alcohol use: No  . Drug use: No  . Sexual activity: Not on file  Lifestyle  . Physical activity:    Days per week: Not on file    Minutes per session: Not on file  . Stress: Not on file  Relationships  . Social connections:    Talks on phone: Not on file    Gets together: Not on file    Attends religious service: Not on file    Active member of club or organization: Not on file    Attends meetings of clubs or organizations: Not on file    Relationship status: Not on file  . Intimate partner violence:   Fear of current or ex partner: Not on file    Emotionally abused: Not on file    Physically abused: Not on file    Forced sexual activity: Not on file  Other Topics Concern  . Not on file  Social History Narrative   Original from [place] ---   Diet: very  healthy   Exercise- not frequently but active at work                Past Surgical History:  Procedure Laterality Date  . COLONOSCOPY W/ POLYPECTOMY  11/2011  . MASTECTOMY Left 04/2009    Family History  Problem Relation Age of Onset  . Heart attack Father 49       F age 56, M age 16s  . Heart disease Father   . Diabetes Sister   . Diabetes Brother   . Diabetes Sister   . Heart disease Mother   . Diabetes Maternal Aunt   . Diabetes Maternal Uncle   . Diabetes Paternal Aunt   . Diabetes Paternal Uncle   . Diabetes Maternal Grandmother   . Diabetes Maternal Grandfather   . Diabetes Paternal Grandmother   .  Diabetes Paternal Grandfather   . Stroke Neg Hx   . Colon cancer Neg Hx   . Breast cancer Neg Hx     No Known Allergies  Current Outpatient Medications on File Prior to Visit  Medication Sig Dispense Refill  . acetaminophen (TYLENOL) 500 MG tablet Take 1 tablet (500 mg total) by mouth every 6 (six) hours as needed. 30 tablet 0  . atorvastatin (LIPITOR) 40 MG tablet Take 1 tablet (40 mg total) by mouth daily. 90 tablet 3  . b complex vitamins capsule Take 1 capsule by mouth daily.    . cholecalciferol (VITAMIN D) 1000 UNITS tablet Take 1,000 Units by mouth daily.    . cyclobenzaprine (FLEXERIL) 10 MG tablet Take 10 mg by mouth 2 (two) times daily.    . fish oil-omega-3 fatty acids 1000 MG capsule Take 2 g by mouth daily.    . fluticasone (FLONASE) 50 MCG/ACT nasal spray Place 2 sprays into both nostrils daily. 16 g 1  . levocetirizine (XYZAL) 5 MG tablet Take 1 tablet (5 mg total) by mouth every evening. 30 tablet 0  . lisinopril-hydrochlorothiazide (PRINZIDE,ZESTORETIC) 20-25 MG tablet Take 1 tablet by mouth  daily. 90 tablet 1  . meloxicam (MOBIC) 15 MG tablet Take 1 tablet (15 mg total) by mouth daily. 30 tablet 0  . metoprolol succinate (TOPROL-XL) 50 MG 24 hr tablet TAKE 1 TABLET BY MOUTH ONCE DAILY.TAKE WITH OR IMMEDIATELY FOLLOWING A MEAL 90 tablet 1  . rosuvastatin (CRESTOR) 20 MG tablet Take 1 tablet (20 mg total) daily by mouth. 90 tablet 0  . traZODone (DESYREL) 50 MG tablet Take 0.5-1 tablets (25-50 mg total) by mouth at bedtime as needed for sleep. 30 tablet 3   No current facility-administered medications on file prior to visit.     BP 120/70 (BP Location: Right Arm, Patient Position: Sitting, Cuff Size: Small)   Pulse 71   Temp 98.4 F (36.9 C) (Oral)   Resp 16   Ht 5\' 6"  (1.676 m)   Wt 162 lb 3.2 oz (73.6 kg)   SpO2 99%   BMI 26.18 kg/m       Objective:   Physical Exam  Constitutional: She is oriented to person, place, and time. She appears well-developed and well-nourished.  Cardiovascular: Normal rate, regular rhythm and normal heart sounds.  No murmur heard. Pulmonary/Chest: Effort normal and breath sounds normal. No respiratory distress. She has no wheezes.  Abdominal: Soft. Bowel sounds are normal. There is no CVA tenderness.  + suprapubic tenderness without guarding  Genitourinary: Vagina normal.  Genitourinary Comments: Normal adnexa, uterus feels slightly enlarged and is mobile but tender when manipulated.   Musculoskeletal: She exhibits no edema.  Neurological: She is alert and oriented to person, place, and time.  Psychiatric: She has a normal mood and affect. Her behavior is normal. Judgment and thought content normal.          Assessment & Plan:  Pelvic/Suprapubic pain- UA negative but will start empiric keflex and send for culture. I think she may have fibroids, I will also order a pelvic ultrasound to further evaluate.

## 2017-11-10 ENCOUNTER — Telehealth: Payer: Self-pay | Admitting: Family

## 2017-11-10 DIAGNOSIS — N838 Other noninflammatory disorders of ovary, fallopian tube and broad ligament: Secondary | ICD-10-CM

## 2017-11-10 LAB — URINE CULTURE
MICRO NUMBER: 90436037
RESULT: NO GROWTH
SPECIMEN QUALITY: ADEQUATE

## 2017-11-10 NOTE — Telephone Encounter (Signed)
Please see request for Dr. Toney Rakes.

## 2017-11-10 NOTE — Telephone Encounter (Signed)
All information given to patient (in [place]) advised her referral will be sent to Dr. Toney Rakes in [place] as she prefers spanish speaking and she has seen him in the past.

## 2017-11-10 NOTE — Telephone Encounter (Signed)
Please call patient and let her know that her pelvic US shows a large mass likely from her ovary.  I would like her to see GYN as soon as possible. Referral placed. Let us know if she has not heard back about her appointment in next 2-3 days.    Danton Sewer.

## 2017-11-11 NOTE — Telephone Encounter (Signed)
[facility] at patient's request and scheduled her an appointment for 11-17-17 at 11:00am. Referral sent in by [facility].

## 2017-11-11 NOTE — Telephone Encounter (Signed)
Patient called back and said she had a missed call. I don't see any note on a call to the patient today. Please call patient back, if office called her, thanks.

## 2017-11-11 NOTE — Telephone Encounter (Signed)
I talked to patient earlier (in [place]) and explained to her that her urine culture was negative for infection and Melissa said she can discontinue the antibiotic. I also asked her if she received a call about the referral and she said yes but her previous doctor, Doctor Toney Rakes was no longer in practice. She prefers a spanish speaking provider. I asked her if she can find one in her area that speaks spanish she can call us with  The information and we will send the referral and pertaining information. I tried to call her back now but her number is unavailable at this time.

## 2017-11-11 NOTE — Telephone Encounter (Signed)
Patient daughter called with patient on the phone wanting to know about why patient was told to stop antibiotic and about her referral. I told her that I could not dicuss patients medical history since she were on a 3 way call calling the office. I asked the patient what she needed and she requested the same as daughter. She would like to know why the person that called her today from the office told her to stop antibiotic and wants to now about her referral to GYN. She would like to talk to another spanish speaking person in the office other then who called her. Please call patient back, thanks.

## 2017-11-17 ENCOUNTER — Encounter: Payer: Self-pay | Admitting: Obstetrics & Gynecology

## 2017-11-17 ENCOUNTER — Telehealth: Payer: Self-pay | Admitting: *Deleted

## 2017-11-17 ENCOUNTER — Ambulatory Visit (INDEPENDENT_AMBULATORY_CARE_PROVIDER_SITE_OTHER): Payer: Medicare Other | Admitting: Obstetrics & Gynecology

## 2017-11-17 VITALS — BP 146/88 | Ht 65.0 in | Wt 155.0 lb

## 2017-11-17 DIAGNOSIS — Z124 Encounter for screening for malignant neoplasm of cervix: Secondary | ICD-10-CM

## 2017-11-17 DIAGNOSIS — Z853 Personal history of malignant neoplasm of breast: Secondary | ICD-10-CM | POA: Diagnosis not present

## 2017-11-17 DIAGNOSIS — Z9189 Other specified personal risk factors, not elsewhere classified: Secondary | ICD-10-CM | POA: Diagnosis not present

## 2017-11-17 DIAGNOSIS — Z01411 Encounter for gynecological examination (general) (routine) with abnormal findings: Secondary | ICD-10-CM | POA: Diagnosis not present

## 2017-11-17 DIAGNOSIS — N839 Noninflammatory disorder of ovary, fallopian tube and broad ligament, unspecified: Secondary | ICD-10-CM | POA: Diagnosis not present

## 2017-11-17 DIAGNOSIS — R6889 Other general symptoms and signs: Secondary | ICD-10-CM | POA: Diagnosis not present

## 2017-11-17 DIAGNOSIS — N838 Other noninflammatory disorders of ovary, fallopian tube and broad ligament: Secondary | ICD-10-CM

## 2017-11-17 DIAGNOSIS — C50912 Malignant neoplasm of unspecified site of left female breast: Secondary | ICD-10-CM

## 2017-11-17 DIAGNOSIS — Z13820 Encounter for screening for osteoporosis: Secondary | ICD-10-CM | POA: Diagnosis not present

## 2017-11-17 DIAGNOSIS — Z780 Asymptomatic menopausal state: Secondary | ICD-10-CM | POA: Diagnosis not present

## 2017-11-17 DIAGNOSIS — R1909 Other intra-abdominal and pelvic swelling, mass and lump: Secondary | ICD-10-CM | POA: Diagnosis not present

## 2017-11-17 DIAGNOSIS — N83209 Unspecified ovarian cyst, unspecified side: Secondary | ICD-10-CM | POA: Diagnosis not present

## 2017-11-17 NOTE — Telephone Encounter (Signed)
Patient informed of appointment and also provided patient with office phone number and address.

## 2017-11-17 NOTE — Telephone Encounter (Signed)
-----   Message from Princess Bruins, MD sent at 11/17/2017 11:36 AM EDT ----- Regarding: Referral to Gyn-Onco Large complex Left ovarian cyst per Pelvic US 11/2017, but on exam, mass felt in the Right Adnexa.  Pending Ca125, CEA, Ova 1.  Hx of Lt Breast Ca/Mastectomy.  Refer to Gyn-Onco rapidly.

## 2017-11-17 NOTE — Telephone Encounter (Signed)
Message sent to Gyn oncology to schedule and let me know time and date relay to patient.

## 2017-11-17 NOTE — Telephone Encounter (Signed)
Patient scheduled on 11/18/17 @ 12:30pm with Dr. Gerarda Fraction at [facility], will have claudia relay time and date

## 2017-11-17 NOTE — Progress Notes (Signed)
Jillian Chandler 1950-10-01 [id number]   History:    67 y.o. [id number] Married.  Patient presented with her son.  Visit conducted in [place].  RP:  New (>3 yrs) patient presenting for annual gyn exam and discussion of Pelvic US findings 11/09/2017  HPI: Menopause, well on no HRT.  No PMB.  H/O Lt Breast Cancer s/p Lt Mastectomy.   Lower mid/right abdominal/pelvic pain and pressure for 3 weeks.  Normal vaginal secretions.  Abstinent x 10 yrs. Presented 11/09/2017 for that pain and urinary frequency.  Empirically treated with Keflex, but U. Culture negative.  Pelvic US done the same day showed a Large complex Left Ovarian Cyst.  Patient reports loosing about 5 Lbs recently.  No N/V, BMs wnl.  Frequently felt cold in the last few weeks.  Past medical history,surgical history, family history and social history were all reviewed and documented in the EPIC chart.  Gynecologic History No LMP recorded. Patient is postmenopausal. Contraception: post menopausal status/Abstinent Last Pap: 02/2012. Results were: negative/HPV HR neg Last mammogram: 12/2016. Results were: Benign after adding a Breast US. Bone Density: 2003 Colonoscopy: Per Fam MD  Obstetric History OB History  Gravida Para Term Preterm AB Living  4 3 3   1 3   SAB TAB Ectopic Multiple Live Births  1       3    # Outcome Date GA Lbr Len/2nd Weight Sex Delivery Anes PTL Lv  4 SAB           3 Term     M CS-Unspec  N LIV  2 Term     F Vag-Spont  N LIV  1 Term     M Vag-Spont  N LIV     ROS: A ROS was performed and pertinent positives and negatives are included in the history.  GENERAL: No fevers or chills. HEENT: No change in vision, no earache, sore throat or sinus congestion. NECK: No pain or stiffness. CARDIOVASCULAR: No chest pain or pressure. No palpitations. PULMONARY: No shortness of breath, cough or wheeze. GASTROINTESTINAL: No abdominal pain, nausea, vomiting or diarrhea, melena or bright red blood per rectum. GENITOURINARY: No  urinary frequency, urgency, hesitancy or dysuria. MUSCULOSKELETAL: No joint or muscle pain, no back pain, no recent trauma. DERMATOLOGIC: No rash, no itching, no lesions. ENDOCRINE: No polyuria, polydipsia, no heat or cold intolerance. No recent change in weight. HEMATOLOGICAL: No anemia or easy bruising or bleeding. NEUROLOGIC: No headache, seizures, numbness, tingling or weakness. PSYCHIATRIC: No depression, no loss of interest in normal activity or change in sleep pattern.     Exam:   BP (!) 146/88   Ht 5\' 5"  (1.651 m)   Wt 155 lb (70.3 kg)   BMI 25.79 kg/m   Body mass index is 25.79 kg/m.  General appearance : Well developed well nourished female. No acute distress HEENT: Eyes: no retinal hemorrhage or exudates,  Neck supple, trachea midline, no carotid bruits, no thyroidmegaly Lungs: Clear to auscultation, no rhonchi or wheezes, or rib retractions  Heart: Regular rate and rhythm, no murmurs or gallops Breast:Examined in sitting and supine position were symmetrical in appearance, no palpable masses or tenderness,  no skin retraction, no nipple inversion, no nipple discharge, no skin discoloration, no axillary or supraclavicular lymphadenopathy Abdomen: Palpable mass in Rt pelvic/lower abdomen area with tenderness.  No rebound or guarding Extremities: no edema or skin discoloration or tenderness  Pelvic: Vulva: Normal             Vagina:  No gross lesions or discharge  Cervix: No gross lesions or discharge.  Pap reflex done.  Uterus: Difficult to assess because of the presence of the mass  Adnexa:  Left No mass felt at vaginal fornix, NT.  Right:  Large tender mass felt bulging in vaginal fornix, going towards midline anteriorly, non-mobile.  Anus: Normal  Pelvic US 11/09/2016 [place] in [place]:  Uterus: Measurements: 7.3 x 3.0 x 3.5 cm. No fibroids or other mass Visualized.  Endometrium:Thickness: 2.8 mm.  No focal abnormality visualized.  Right ovary: Measurements: 0.8 x  1.9 x 2.1 cm. Right ovary somewhat difficult to visualize, but grossly within normal limits without mass lesion or other focal abnormality.  Left ovary: A large complex cystic lesion positioned within the superior mid and left pelvis/adnexa measures approximately 10.6 x 8.6 x 11.7 cm. Lesion demonstrates multiple scattered internal septations with scattered internal low-level echoes. No definite internal vascularity. The native left ovary is not well visualized, however, this lesion is suspected to be ovarian in origin.  Other findings: Trace free fluid present within the pelvis.   Assessment/Plan:  67 y.o. female for annual exam   1. Encounter for gynecological examination with abnormal finding Normal gynecologic exam with a pelvic mass.  Pap reflex done.  Breast exam status post left mastectomy, right breast normal.  Health labs with family physician.  2. Menopause present On no hormone replacement therapy.  No postmenopausal bleeding.  3. Screening for osteoporosis Will follow up for a bone density.  Vitamin D supplements, calcium rich nutrition and weightbearing physical activity recommended.  4. Ovarian mass Complex ovarian mass probably originating from the left ovary, but felt more on the right on pelvic exam today.  Ultrasound pictures reviewed by my sonographer confirming that the origin of the complex cyst appears to be the left ovary.  Tumor markers done.  Ultrasound findings and pelvic exam findings discussed with patient.  Patient informed of the concern for the risk of an ovarian malignancy.  Patient understands and agrees with referral to gynecology oncology as soon as possible.   - CA 125 - CEA - OVA 1  5. Malignant neoplasm of left female breast, unspecified estrogen receptor status, unspecified site of breast ([place]) Status post left mastectomy.  6. Sensation of feeling cold Rule out hypothyroidism. - TSH  Counseling on above issues and coordination of care more  than 50% for 30 minutes.  Princess Bruins MD, 11:11 AM 11/17/2017

## 2017-11-18 ENCOUNTER — Encounter: Payer: Self-pay | Admitting: Obstetrics & Gynecology

## 2017-11-18 ENCOUNTER — Inpatient Hospital Stay: Payer: Medicare Other | Attending: Obstetrics | Admitting: Obstetrics

## 2017-11-18 ENCOUNTER — Encounter: Payer: Self-pay | Admitting: Obstetrics

## 2017-11-18 VITALS — BP 120/66 | HR 78 | Temp 97.8°F | Resp 18 | Wt 164.0 lb

## 2017-11-18 DIAGNOSIS — R1900 Intra-abdominal and pelvic swelling, mass and lump, unspecified site: Secondary | ICD-10-CM | POA: Insufficient documentation

## 2017-11-18 DIAGNOSIS — Z124 Encounter for screening for malignant neoplasm of cervix: Secondary | ICD-10-CM | POA: Diagnosis not present

## 2017-11-18 NOTE — Patient Instructions (Signed)
1. Encounter for gynecological examination with abnormal finding Normal gynecologic exam with a pelvic mass.  Pap reflex done.  Breast exam status post left mastectomy, right breast normal.  Health labs with family physician.  2. Menopause present On no hormone replacement therapy.  No postmenopausal bleeding.  3. Screening for osteoporosis Will follow up for a bone density.  Vitamin D supplements, calcium rich nutrition and weightbearing physical activity recommended.  4. Ovarian mass Complex ovarian mass probably originating from the left ovary, but felt more on the right on pelvic exam today.  Ultrasound pictures reviewed by my sonographer confirming that the origin of the complex cyst appears to be the left ovary.  Tumor markers done.  Ultrasound findings and pelvic exam findings discussed with patient.  Patient informed of the concern for the risk of an ovarian malignancy.  Patient understands and agrees with referral to gynecology oncology as soon as possible.   - CA 125 - CEA - OVA 1  5. Malignant neoplasm of left female breast, unspecified estrogen receptor status, unspecified site of breast ([place]) Status post left mastectomy.  6. Sensation of feeling cold Rule out hypothyroidism. - TSH  Channell, fue un placer encontrarle hoy!  Voy a informarle de sus [employer].

## 2017-11-18 NOTE — Patient Instructions (Signed)
You will come back to the office on November 25, 2017 at 10 am to discuss your surgery plan for either Dec 02, 2017 or Dec 09, 2017 with the nurse practitioner Joylene John. Call our office at [phone] if you have any questions or concerns.

## 2017-11-18 NOTE — H&P (View-Only) (Signed)
Consult Note: Gyn-Onc  Consult was requested by Dr. Dellis Filbert for the evaluation of Jillian Chandler 67 y.o. female  CC:  Chief Complaint  Patient presents with  . Pelvic mass in female    HPI: Jillian Chandler  is a very nice 67 y.o. P3  She noticed significant mid to right lower pelvic pain and pressure.  She presented 11/09/2017 and was treated for a possible urinary tract infection.  In addition a pelvic ultrasound was performed as noted below revealing a 10.6 x 8.6 x 11.7 left complex cystic lesion in the adenxa.  She was referred on to Dr. Dellis Filbert for further workup.  A Pap smear was performed which is currently still pending.  CA 125 and CEA were also performed and those are both still pending.  The patient states her pain improved since that time.  She denies nausea and vomiting changes in her bowel movements and she does admit to some urinary frequency and states that she has lost 5 pounds over states this is been over 8 months and somewhat intentional with increased activity.  Likely unrelated she notes cold at times.  She notes to me that she has low-cholesterol but is noncompliant with the medication because it makes her feel dizzy.  Does take meloxicam for arthritis.  Given the complex nature of the adnexal mass she was referred to [place] for management.  In addition she has a personal history of breast cancer.  She denies any use of tamoxifen.  She attempted Arimidex use but did not tolerate due to worsening of her arthritis.  Measurement of disease:  Recent Labs    11/17/17 1144  CA125 4  CEA <0.5    . Pending further workup   Radiology: . Pelvic ultrasound 11/09/2017 bone imaging-uterus 7 x 3 x 3.5 normal endometrium 2.8 mm.  Right ovary somewhat difficult to visualize but grossly normal.  Left ovary large complex cystic lesion 1.6 x 8.6 x 11.7 with multiple scattered internal septations and low level echoes.  No internal vascularity.  Trace free fluid.    Oncologic History:  Pending further workup    No history exists.    Current Meds:  Outpatient Encounter Medications as of 11/18/2017  Medication Sig  . cholecalciferol (VITAMIN D) 1000 UNITS tablet Take 1,000 Units by mouth daily.  Marland Kitchen lisinopril-hydrochlorothiazide (PRINZIDE,ZESTORETIC) 20-25 MG tablet Take 1 tablet by mouth daily.  . meloxicam (MOBIC) 15 MG tablet Take 1 tablet (15 mg total) by mouth daily.  . metoprolol succinate (TOPROL-XL) 50 MG 24 hr tablet TAKE 1 TABLET BY MOUTH ONCE DAILY.TAKE WITH OR IMMEDIATELY FOLLOWING A MEAL  . acetaminophen (TYLENOL) 500 MG tablet Take 1 tablet (500 mg total) by mouth every 6 (six) hours as needed. (Patient not taking: Reported on 11/18/2017)  . atorvastatin (LIPITOR) 40 MG tablet Take 1 tablet (40 mg total) by mouth daily. (Patient not taking: Reported on 11/18/2017)  . b complex vitamins capsule Take 1 capsule by mouth daily.  . cyclobenzaprine (FLEXERIL) 10 MG tablet Take 10 mg by mouth 2 (two) times daily.  . fish oil-omega-3 fatty acids 1000 MG capsule Take 2 g by mouth daily.  . fluticasone (FLONASE) 50 MCG/ACT nasal spray Place 2 sprays into both nostrils daily. (Patient not taking: Reported on 11/18/2017)  . levocetirizine (XYZAL) 5 MG tablet Take 1 tablet (5 mg total) by mouth every evening. (Patient not taking: Reported on 11/18/2017)  . rosuvastatin (CRESTOR) 20 MG tablet Take 1 tablet (20 mg total) daily by mouth. (  Patient not taking: Reported on 11/18/2017)  . traZODone (DESYREL) 50 MG tablet Take 0.5-1 tablets (25-50 mg total) by mouth at bedtime as needed for sleep. (Patient not taking: Reported on 11/18/2017)  . [DISCONTINUED] cephALEXin (KEFLEX) 500 MG capsule Take 1 capsule (500 mg total) by mouth 2 (two) times daily. (Patient not taking: Reported on 11/18/2017)   No facility-administered encounter medications on file as of 11/18/2017.     Allergy: No Known Allergies  Social Hx:   Social History   Socioeconomic History  . Marital status: Married     Spouse name: Not on file  . Number of children: 4  . Years of education: Not on file  . Highest education level: Not on file  Occupational History  . 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 Financial resource strain: Not on file  . Food insecurity:    Worry: Not on file    Inability: Not on file  . Transportation needs:    Medical: Not on file    Non-medical: Not on file  Tobacco Use  . Smoking status: Former Smoker    Last attempt to quit: 02/17/2007    Years since quitting: 10.7  . Smokeless tobacco: Never Used  Substance and Sexual Activity  . Alcohol use: No  . Drug use: No  . Sexual activity: Not Currently  Lifestyle  . Physical activity:    Days per week: Not on file    Minutes per session: Not on file  . Stress: Not on file  Relationships  . Social connections:    Talks on phone: Not on file    Gets together: Not on file    Attends religious service: Not on file    Active member of club or organization: Not on file    Attends meetings of clubs or organizations: Not on file    Relationship status: Not on file  . Intimate partner violence:    Fear of current or ex partner: Not on file    Emotionally abused: Not on file    Physically abused: Not on file    Forced sexual activity: Not on file  Other Topics Concern  . Not on file  Social History Narrative   Original from [place] ---   Diet: very  healthy   Exercise- not frequently but active at work                Past Surgical Hx:  Past Surgical History:  Procedure Laterality Date  . COLONOSCOPY W/ POLYPECTOMY  11/2011  . MASTECTOMY Left 04/2009    Past Medical Hx:  Past Medical History:  Diagnosis Date  . Breast cancer ([place]) 01/2009   left brest- status post mastectomy, intolerant to Arimidex, declined tamoxfien  . Gastric ulcer    CLO (-) per EGD ~ 11-2011  . Hyperlipidemia   . Hypertension 2006    Past Gynecological History:   GYNECOLOGIC HISTORY:  No LMP  recorded. Patient is postmenopausal. Menarche: 67 years old P 55 LMP 67 years old Contraceptive none HRT none  Last Pap 2013- with negative HPV repeated 2019 currently pending  Family Hx:  Family History  Problem Relation Age of Onset  . Heart attack Father 26       F age 82, M age 66s  . Heart disease Father   . Diabetes Sister   . Diabetes Brother   . Diabetes Sister   . Heart disease Mother   . Diabetes  Maternal Aunt   . Diabetes Maternal Uncle   . Diabetes Paternal Aunt   . Diabetes Paternal Uncle   . Diabetes Maternal Grandmother   . Diabetes Maternal Grandfather   . Diabetes Paternal Grandmother   . Diabetes Paternal Grandfather   . Stroke Neg Hx   . Colon cancer Neg Hx   . Breast cancer Neg Hx     Review of Systems:  Review of Systems  Constitutional: Positive for chills.  All other systems reviewed and are negative.    Vitals:  Blood pressure 120/66, pulse 78, temperature 97.8 F (36.6 C), temperature source Oral, resp. rate 18, weight 164 lb (74.4 kg), SpO2 99 %. Body mass index is 27.29 kg/m.   Physical Exam: ECOG PERFORMANCE STATUS: 1 - Symptomatic but completely ambulatory   General :  Well developed, 67 y.o., female in no apparent distress HEENT:  Normocephalic/atraumatic, symmetric, EOMI, eyelids normal Neck:   Supple, no masses.  Lymphatics:  No cervical/ submandibular/ supraclavicular/ infraclavicular/ inguinal adenopathy Respiratory:  Respirations unlabored, no use of accessory muscles CV:   Deferred Breast:  Deferred Musculoskeletal: No CVA tenderness, normal muscle strength. Abdomen:  Soft, non-tender and nondistended. No evidence of hernia. No masses. Extremities:  No lymphedema, no erythema, non-tender. Skin:   Normal inspection Neuro/Psych:  No focal motor deficit, no abnormal mental status. Normal gait. Normal affect. Alert and oriented to person, place, and time  Genito Urinary: Vulva: Normal external female genitalia.   Bladder/urethra: Urethral meatus normal in size and location. No lesions or   masses, well supported bladder Speculum exam: Vagina: No lesion, no discharge, no bleeding. Cervix: Normal appearing, no lesions. Bimanual exam:  Uterus: Normal size, mobile.  Adnexa: No masses impinging into culdesac. Question fullness on deep palpation. Rectovaginal:  Good tone, no masses, no cul de sac nodularity, no parametrial involvement or nodularity.  Oncologic Summary: 1. Pelvic mass complex in nature in a postmenopausal patient   Assessment/Plan: 1. Complex pelvic mass in a postmenopausal patient o Discussed recommendation is for surgical resection o Ca1 25 has been performed and is pending along with a CEA 2. Surgical discussion o We reviewed the surgical sketch including risks benefits and alternatives. o We discussed the risk of conversion to laparotomy for inability to resect, complication, or frozen section showing malignancy o We reviewed risks specific to lymphadenectomy o After discussion patient proceed with [place] BSO with possible ExLap/TAH/Staging o We reviewed length of stay and operative times 3. In preparation for surgery I asked her to hold her fish oil and meloxicam starting Monday and explained the reasoning behind the hold 4. RTC 10-14 days postop for pathology review and wound check   Isabel Caprice, MD  11/20/2017, 2:58 PM  Cc: Princess Bruins, MD Mackie Pai, PA-C

## 2017-11-18 NOTE — Progress Notes (Signed)
Consult Note: Gyn-Onc  Consult was requested by Dr. Dellis Filbert for the evaluation of Jillian Chandler 67 y.o. female  CC:  Chief Complaint  Patient presents with  . Pelvic mass in female    HPI: Jillian Chandler  is a very nice 67 y.o. P3  She noticed significant mid to right lower pelvic pain and pressure.  She presented 11/09/2017 and was treated for a possible urinary tract infection.  In addition a pelvic ultrasound was performed as noted below revealing a 10.6 x 8.6 x 11.7 left complex cystic lesion in the adenxa.  She was referred on to Dr. Dellis Filbert for further workup.  A Pap smear was performed which is currently still pending.  CA 125 and CEA were also performed and those are both still pending.  The patient states her pain improved since that time.  She denies nausea and vomiting changes in her bowel movements and she does admit to some urinary frequency and states that she has lost 5 pounds over states this is been over 8 months and somewhat intentional with increased activity.  Likely unrelated she notes cold at times.  She notes to me that she has low-cholesterol but is noncompliant with the medication because it makes her feel dizzy.  Does take meloxicam for arthritis.  Given the complex nature of the adnexal mass she was referred to [place] for management.  In addition she has a personal history of breast cancer.  She denies any use of tamoxifen.  She attempted Arimidex use but did not tolerate due to worsening of her arthritis.  Measurement of disease:  Recent Labs    11/17/17 1144  CA125 4  CEA <0.5    . Pending further workup   Radiology: . Pelvic ultrasound 11/09/2017 bone imaging-uterus 7 x 3 x 3.5 normal endometrium 2.8 mm.  Right ovary somewhat difficult to visualize but grossly normal.  Left ovary large complex cystic lesion 1.6 x 8.6 x 11.7 with multiple scattered internal septations and low level echoes.  No internal vascularity.  Trace free fluid.    Oncologic History:  Pending further workup    No history exists.    Current Meds:  Outpatient Encounter Medications as of 11/18/2017  Medication Sig  . cholecalciferol (VITAMIN D) 1000 UNITS tablet Take 1,000 Units by mouth daily.  Marland Kitchen lisinopril-hydrochlorothiazide (PRINZIDE,ZESTORETIC) 20-25 MG tablet Take 1 tablet by mouth daily.  . meloxicam (MOBIC) 15 MG tablet Take 1 tablet (15 mg total) by mouth daily.  . metoprolol succinate (TOPROL-XL) 50 MG 24 hr tablet TAKE 1 TABLET BY MOUTH ONCE DAILY.TAKE WITH OR IMMEDIATELY FOLLOWING A MEAL  . acetaminophen (TYLENOL) 500 MG tablet Take 1 tablet (500 mg total) by mouth every 6 (six) hours as needed. (Patient not taking: Reported on 11/18/2017)  . atorvastatin (LIPITOR) 40 MG tablet Take 1 tablet (40 mg total) by mouth daily. (Patient not taking: Reported on 11/18/2017)  . b complex vitamins capsule Take 1 capsule by mouth daily.  . cyclobenzaprine (FLEXERIL) 10 MG tablet Take 10 mg by mouth 2 (two) times daily.  . fish oil-omega-3 fatty acids 1000 MG capsule Take 2 g by mouth daily.  . fluticasone (FLONASE) 50 MCG/ACT nasal spray Place 2 sprays into both nostrils daily. (Patient not taking: Reported on 11/18/2017)  . levocetirizine (XYZAL) 5 MG tablet Take 1 tablet (5 mg total) by mouth every evening. (Patient not taking: Reported on 11/18/2017)  . rosuvastatin (CRESTOR) 20 MG tablet Take 1 tablet (20 mg total) daily by mouth. (  Patient not taking: Reported on 11/18/2017)  . traZODone (DESYREL) 50 MG tablet Take 0.5-1 tablets (25-50 mg total) by mouth at bedtime as needed for sleep. (Patient not taking: Reported on 11/18/2017)  . [DISCONTINUED] cephALEXin (KEFLEX) 500 MG capsule Take 1 capsule (500 mg total) by mouth 2 (two) times daily. (Patient not taking: Reported on 11/18/2017)   No facility-administered encounter medications on file as of 11/18/2017.     Allergy: No Known Allergies  Social Hx:   Social History   Socioeconomic History  . Marital status: Married     Spouse name: Not on file  . Number of children: 4  . Years of education: Not on file  . Highest education level: Not on file  Occupational History  . 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 Financial resource strain: Not on file  . Food insecurity:    Worry: Not on file    Inability: Not on file  . Transportation needs:    Medical: Not on file    Non-medical: Not on file  Tobacco Use  . Smoking status: Former Smoker    Last attempt to quit: 02/17/2007    Years since quitting: 10.7  . Smokeless tobacco: Never Used  Substance and Sexual Activity  . Alcohol use: No  . Drug use: No  . Sexual activity: Not Currently  Lifestyle  . Physical activity:    Days per week: Not on file    Minutes per session: Not on file  . Stress: Not on file  Relationships  . Social connections:    Talks on phone: Not on file    Gets together: Not on file    Attends religious service: Not on file    Active member of club or organization: Not on file    Attends meetings of clubs or organizations: Not on file    Relationship status: Not on file  . Intimate partner violence:    Fear of current or ex partner: Not on file    Emotionally abused: Not on file    Physically abused: Not on file    Forced sexual activity: Not on file  Other Topics Concern  . Not on file  Social History Narrative   Original from [place] ---   Diet: very  healthy   Exercise- not frequently but active at work                Past Surgical Hx:  Past Surgical History:  Procedure Laterality Date  . COLONOSCOPY W/ POLYPECTOMY  11/2011  . MASTECTOMY Left 04/2009    Past Medical Hx:  Past Medical History:  Diagnosis Date  . Breast cancer ([place]) 01/2009   left brest- status post mastectomy, intolerant to Arimidex, declined tamoxfien  . Gastric ulcer    CLO (-) per EGD ~ 11-2011  . Hyperlipidemia   . Hypertension 2006    Past Gynecological History:   GYNECOLOGIC HISTORY:  No LMP  recorded. Patient is postmenopausal. Menarche: 67 years old P 107 LMP 67 years old Contraceptive none HRT none  Last Pap 2013- with negative HPV repeated 2019 currently pending  Family Hx:  Family History  Problem Relation Age of Onset  . Heart attack Father 50       F age 48, M age 79s  . Heart disease Father   . Diabetes Sister   . Diabetes Brother   . Diabetes Sister   . Heart disease Mother   . Diabetes  Maternal Aunt   . Diabetes Maternal Uncle   . Diabetes Paternal Aunt   . Diabetes Paternal Uncle   . Diabetes Maternal Grandmother   . Diabetes Maternal Grandfather   . Diabetes Paternal Grandmother   . Diabetes Paternal Grandfather   . Stroke Neg Hx   . Colon cancer Neg Hx   . Breast cancer Neg Hx     Review of Systems:  Review of Systems  Constitutional: Positive for chills.  All other systems reviewed and are negative.    Vitals:  Blood pressure 120/66, pulse 78, temperature 97.8 F (36.6 C), temperature source Oral, resp. rate 18, weight 164 lb (74.4 kg), SpO2 99 %. Body mass index is 27.29 kg/m.   Physical Exam: ECOG PERFORMANCE STATUS: 1 - Symptomatic but completely ambulatory   General :  Well developed, 67 y.o., female in no apparent distress HEENT:  Normocephalic/atraumatic, symmetric, EOMI, eyelids normal Neck:   Supple, no masses.  Lymphatics:  No cervical/ submandibular/ supraclavicular/ infraclavicular/ inguinal adenopathy Respiratory:  Respirations unlabored, no use of accessory muscles CV:   Deferred Breast:  Deferred Musculoskeletal: No CVA tenderness, normal muscle strength. Abdomen:  Soft, non-tender and nondistended. No evidence of hernia. No masses. Extremities:  No lymphedema, no erythema, non-tender. Skin:   Normal inspection Neuro/Psych:  No focal motor deficit, no abnormal mental status. Normal gait. Normal affect. Alert and oriented to person, place, and time  Genito Urinary: Vulva: Normal external female genitalia.   Bladder/urethra: Urethral meatus normal in size and location. No lesions or   masses, well supported bladder Speculum exam: Vagina: No lesion, no discharge, no bleeding. Cervix: Normal appearing, no lesions. Bimanual exam:  Uterus: Normal size, mobile.  Adnexa: No masses impinging into culdesac. Question fullness on deep palpation. Rectovaginal:  Good tone, no masses, no cul de sac nodularity, no parametrial involvement or nodularity.  Oncologic Summary: 1. Pelvic mass complex in nature in a postmenopausal patient   Assessment/Plan: 1. Complex pelvic mass in a postmenopausal patient o Discussed recommendation is for surgical resection o Ca1 25 has been performed and is pending along with a CEA 2. Surgical discussion o We reviewed the surgical sketch including risks benefits and alternatives. o We discussed the risk of conversion to laparotomy for inability to resect, complication, or frozen section showing malignancy o We reviewed risks specific to lymphadenectomy o After discussion patient proceed with [place] BSO with possible ExLap/TAH/Staging o We reviewed length of stay and operative times 3. In preparation for surgery I asked her to hold her fish oil and meloxicam starting Monday and explained the reasoning behind the hold 4. RTC 10-14 days postop for pathology review and wound check   Isabel Caprice, MD  11/20/2017, 2:58 PM  Cc: Princess Bruins, MD Mackie Pai, PA-C

## 2017-11-19 LAB — PAP IG W/ RFLX HPV ASCU

## 2017-11-19 LAB — TSH: TSH: 1.54 mIU/L (ref 0.40–4.50)

## 2017-11-19 LAB — CA 125: CA 125: 4 U/mL (ref <35)

## 2017-11-19 LAB — CEA: CEA: <0.5 ng/mL

## 2017-11-20 ENCOUNTER — Encounter: Payer: Self-pay | Admitting: Obstetrics

## 2017-11-20 DIAGNOSIS — R1900 Intra-abdominal and pelvic swelling, mass and lump, unspecified site: Secondary | ICD-10-CM | POA: Insufficient documentation

## 2017-11-22 ENCOUNTER — Telehealth: Payer: Self-pay | Admitting: Family

## 2017-11-22 ENCOUNTER — Telehealth: Payer: Self-pay

## 2017-11-22 NOTE — Telephone Encounter (Signed)
Told Husband that Dr. Gerarda Fraction wants Jillian Chandler to stop taking her meloxicam now until after her surgery. This medication could make her bleed more easily during the surgery. Husband said he would let his wife know as she was not at home. Left same message in daughter  Sneha's  phone number as well.   Daughter speaks english.

## 2017-11-22 NOTE — Telephone Encounter (Signed)
Could you please call [facility] and request CPAP download report?

## 2017-11-22 NOTE — Telephone Encounter (Signed)
Error, please disregard.

## 2017-11-23 ENCOUNTER — Encounter: Payer: Self-pay | Admitting: Anesthesiology

## 2017-11-25 ENCOUNTER — Inpatient Hospital Stay (HOSPITAL_BASED_OUTPATIENT_CLINIC_OR_DEPARTMENT_OTHER): Payer: Medicare Other | Admitting: Gynecologic Oncology

## 2017-11-25 ENCOUNTER — Telehealth: Payer: Self-pay | Admitting: Family

## 2017-11-25 ENCOUNTER — Encounter: Payer: Self-pay | Admitting: Gynecologic Oncology

## 2017-11-25 VITALS — BP 116/72 | HR 70 | Temp 99.1°F | Resp 20 | Ht 66.0 in | Wt 162.8 lb

## 2017-11-25 DIAGNOSIS — R1900 Intra-abdominal and pelvic swelling, mass and lump, unspecified site: Secondary | ICD-10-CM

## 2017-11-25 NOTE — Telephone Encounter (Signed)
Opened in error

## 2017-11-25 NOTE — Progress Notes (Signed)
Pre-operative Follow up: Jillian Chandler 67 y.o. female  CC:  Chief Complaint  Patient presents with  . Pelvic mass in female    pre-op discussion    HPI:  Jillian Chandler  is a 67 y.o. Female, P3, initially referred by Dr. Dellis Filbert for a ovarian mass.  She developed moderate pelvic discomfort and pressure and sought care on 11/09/2017.  She was treated for a urinary tract infection at that time.  An [place] was performed which resulted a 10.6 x 8.6 x 11.7 left complex cystic lesion in the adenxa.  She was then referred on to Dr. Dellis Filbert for further workup.  A pap smear was performed which was negative for intraepithelial lesions or malignancy.  CA 125 was 4 and CEA was <0.5.  She also reports urinary frequency. Past medical history includes breast cancer which she attempted use of Arimidex for but discontinued due to worsening arthritis.  Interval History: She presents today alone but with an interpreter present to discuss her upcoming surgery.  Surgery instructions discussed in detail.  She continues to report urinary frequency.  All questions answered.  She has stopped her meloxicam as well.  She is asking for a bedside commode or shower chair after surgery due to worsening pain in her lower extremities and decreased mobility.  She normally takes meloxicam but has stopped due to her upcoming surgery.    Review of Systems  Constitutional: Feels well. No fever, chills.  Cardiovascular: No chest pain, shortness of breath, or edema.  Pulmonary: No cough or wheeze.  Gastrointestinal: No nausea, vomiting, or diarrhea. No bright red blood per rectum or change in bowel movement.  Genitourinary: Positive for frequency. No urgency, or dysuria. No vaginal bleeding or discharge.  Musculoskeletal: Lower extremity pain bilaterally due to arthritis. Neurologic: No weakness, numbness.  Change in gait related to arthritic pain.  Psychology: No depression, anxiety, or insomnia.  Current Meds:   Outpatient Encounter Medications as of 11/25/2017  Medication Sig  . acetaminophen (TYLENOL) 500 MG tablet Take 1 tablet (500 mg total) by mouth every 6 (six) hours as needed.  Marland Kitchen b complex vitamins capsule Take 1 capsule by mouth daily.  . cholecalciferol (VITAMIN D) 1000 UNITS tablet Take 1,000 Units by mouth daily.  Marland Kitchen lisinopril-hydrochlorothiazide (PRINZIDE,ZESTORETIC) 20-25 MG tablet Take 1 tablet by mouth daily.  . metoprolol succinate (TOPROL-XL) 50 MG 24 hr tablet TAKE 1 TABLET BY MOUTH ONCE DAILY.TAKE WITH OR IMMEDIATELY FOLLOWING A MEAL  . [DISCONTINUED] atorvastatin (LIPITOR) 40 MG tablet Take 1 tablet (40 mg total) by mouth daily. (Patient not taking: Reported on 11/18/2017)  . [DISCONTINUED] cyclobenzaprine (FLEXERIL) 10 MG tablet Take 10 mg by mouth 2 (two) times daily.  . [DISCONTINUED] fish oil-omega-3 fatty acids 1000 MG capsule Take 2 g by mouth daily.  . [DISCONTINUED] fluticasone (FLONASE) 50 MCG/ACT nasal spray Place 2 sprays into both nostrils daily. (Patient not taking: Reported on 11/18/2017)  . [DISCONTINUED] levocetirizine (XYZAL) 5 MG tablet Take 1 tablet (5 mg total) by mouth every evening. (Patient not taking: Reported on 11/18/2017)  . [DISCONTINUED] meloxicam (MOBIC) 15 MG tablet Take 1 tablet (15 mg total) by mouth daily. (Patient not taking: Reported on 11/25/2017)  . [DISCONTINUED] rosuvastatin (CRESTOR) 20 MG tablet Take 1 tablet (20 mg total) daily by mouth. (Patient not taking: Reported on 11/18/2017)  . [DISCONTINUED] traZODone (DESYREL) 50 MG tablet Take 0.5-1 tablets (25-50 mg total) by mouth at bedtime as needed for sleep. (Patient not taking: Reported on 11/18/2017)  No facility-administered encounter medications on file as of 11/25/2017.     Allergy: No Known Allergies  Social Hx:   Social History   Socioeconomic History  . Marital status: Married    Spouse name: Not on file  . Number of children: 4  . Years of education: Not on file  . Highest  education level: Not on file  Occupational History  . 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 Financial resource strain: Not on file  . Food insecurity:    Worry: Not on file    Inability: Not on file  . Transportation needs:    Medical: Not on file    Non-medical: Not on file  Tobacco Use  . Smoking status: Former Smoker    Last attempt to quit: 02/17/2007    Years since quitting: 10.7  . Smokeless tobacco: Never Used  Substance and Sexual Activity  . Alcohol use: No  . Drug use: No  . Sexual activity: Not Currently  Lifestyle  . Physical activity:    Days per week: Not on file    Minutes per session: Not on file  . Stress: Not on file  Relationships  . Social connections:    Talks on phone: Not on file    Gets together: Not on file    Attends religious service: Not on file    Active member of club or organization: Not on file    Attends meetings of clubs or organizations: Not on file    Relationship status: Not on file  . Intimate partner violence:    Fear of current or ex partner: Not on file    Emotionally abused: Not on file    Physically abused: Not on file    Forced sexual activity: Not on file  Other Topics Concern  . Not on file  Social History Narrative   Original from [place] ---   Diet: very  healthy   Exercise- not frequently but active at work                Past Surgical Hx:  Past Surgical History:  Procedure Laterality Date  . COLONOSCOPY W/ POLYPECTOMY  11/2011  . MASTECTOMY Left 04/2009    Past Medical Hx:  Past Medical History:  Diagnosis Date  . Breast cancer ([place]) 01/2009   left brest- status post mastectomy, intolerant to Arimidex, declined tamoxfien  . Gastric ulcer    CLO (-) per EGD ~ 11-2011  . Hyperlipidemia   . Hypertension 2006    Family Hx:  Family History  Problem Relation Age of Onset  . Heart attack Father 21       F age 74, M age 74s  . Heart disease Father   . Diabetes Sister    . Diabetes Brother   . Diabetes Sister   . Heart disease Mother   . Diabetes Maternal Aunt   . Diabetes Maternal Uncle   . Diabetes Paternal Aunt   . Diabetes Paternal Uncle   . Diabetes Maternal Grandmother   . Diabetes Maternal Grandfather   . Diabetes Paternal Grandmother   . Diabetes Paternal Grandfather   . Stroke Neg Hx   . Colon cancer Neg Hx   . Breast cancer Neg Hx     Vitals:  Blood pressure 116/72, pulse 70, temperature 99.1 F (37.3 C), temperature source Oral, resp. rate 20, height 5\' 6"  (1.676 m), weight 162 lb 12.8 oz (73.8 kg).  Physical Exam:  General: Well developed, well nourished female in no acute distress. Alert and oriented x 3.  Cardiovascular: Regular rate and rhythm. S1 and S2 normal.  Lungs: Clear to auscultation bilaterally. No wheezes/crackles/rhonchi noted.  Extremities: No bilateral cyanosis, edema, or clubbing.   Assessment/Plan: 67 year old with ovarian mass for robotic assisted bilateral salpingo-oophorectomy with possible exploratory laparotomy/total abdominal hysterectomy/staging if malignancy identified on Dec 09, 2017. Procedure discussed in detail and all questions answered.  Pre-surgical instructions discussed and information given in writing.  She is advised to call for any questions or concerns.  She will receive a phone call from pre-surgical testing to arrange for her pre-operative appointment.   Dorothyann Gibbs, NP 11/25/2017, 9:39 PM

## 2017-11-25 NOTE — Patient Instructions (Addendum)
Preparing for your Surgery  Plan for surgery on Dec 09, 2017 with Dr. Precious Haws.  You will be scheduled for a robotic assisted bilateral salpingo-oophorectomy, possible exploratory laparotomy, possible hysterectomy with staging if cancer identified.    Pre-operative Testing -You will receive a phone call from presurgical testing at [facility] to arrange for a pre-operative testing appointment before your surgery.  This appointment normally occurs one to two weeks before your scheduled surgery.   -Bring your insurance card, copy of an advanced directive if applicable, medication list  -At that visit, you will be asked to sign a consent for a possible blood transfusion in case a transfusion becomes necessary during surgery.  The need for a blood transfusion is rare but having consent is a necessary part of your care.    -You should not be taking blood thinners or aspirin at least ten days prior to surgery unless instructed by your surgeon.  -Stay off of your Meloxicam.  Use Tylenol for pain if needed prior to surgery.  No ibuprofen or NSAIDS.  Day Before Surgery at [place] will be asked to take in a light diet the day before surgery.  Avoid carbonated beverages.  You will be advised to have nothing to eat or drink after midnight the evening before.    Eat a light diet the day before surgery.  Examples including soups, broths, toast, yogurt, mashed potatoes.  Things to avoid include carbonated beverages (fizzy beverages), raw fruits and raw vegetables, or beans.   If your bowels are filled with gas, your surgeon will have difficulty visualizing your pelvic organs which increases your surgical risks.  Your role in recovery Your role is to become active as soon as directed by your doctor, while still giving yourself time to heal.  Rest when you feel tired. You will be asked to do the following in order to speed your recovery:  - Cough and breathe deeply. This helps  toclear and expand your lungs and can prevent pneumonia. You may be given a spirometer to practice deep breathing. A staff member will show you how to use the spirometer. - Do mild physical activity. Walking or moving your legs help your circulation and body functions return to normal. A staff member will help you when you try to walk and will provide you with simple exercises. Do not try to get up or walk alone the first time. - Actively manage your pain. Managing your pain lets you move in comfort. We will ask you to rate your pain on a scale of zero to 10. It is your responsibility to tell your doctor or nurse where and how much you hurt so your pain can be treated.  Special Considerations -If you are diabetic, you may be placed on insulin after surgery to have closer control over your blood sugars to promote healing and recovery.  This does not mean that you will be discharged on insulin.  If applicable, your oral antidiabetics will be resumed when you are tolerating a solid diet.  -Your final pathology results from surgery should be available by the Friday after surgery and the results will be relayed to you when available.  -Dr. Lahoma Crocker is the Surgeon that assists your GYN Oncologist with surgery.  The next day after your surgery you will either see your GYN Oncologist or Dr. Lahoma Crocker.   Blood Transfusion Information WHAT IS A BLOOD TRANSFUSION? A transfusion is the replacement of blood or some of its parts.  Blood is made up of multiple cells which provide different functions.  Red blood cells carry oxygen and are used for blood loss replacement.  White blood cells fight against infection.  Platelets control bleeding.  Plasma helps clot blood.  Other blood products are available for specialized needs, such as hemophilia or other clotting disorders. BEFORE THE TRANSFUSION  Who gives blood for transfusions?   You may be able to donate blood to be used at a later  date on yourself (autologous donation).  Relatives can be asked to donate blood. This is generally not any safer than if you have received blood from a stranger. The same precautions are taken to ensure safety when a relative's blood is donated.  Healthy volunteers who are fully evaluated to make sure their blood is safe. This is blood bank blood. Transfusion therapy is the safest it has ever been in the practice of medicine. Before blood is taken from a donor, a complete history is taken to make sure that person has no history of diseases nor engages in risky social behavior (examples are intravenous drug use or sexual activity with multiple partners). The donor's travel history is screened to minimize risk of transmitting infections, such as malaria. The donated blood is tested for signs of infectious diseases, such as HIV and hepatitis. The blood is then tested to be sure it is compatible with you in order to minimize the chance of a transfusion reaction. If you or a relative donates blood, this is often done in anticipation of surgery and is not appropriate for emergency situations. It takes many days to process the donated blood. RISKS AND COMPLICATIONS Although transfusion therapy is very safe and saves many lives, the main dangers of transfusion include:   Getting an infectious disease.  Developing a transfusion reaction. This is an allergic reaction to something in the blood you were given. Every precaution is taken to prevent this. The decision to have a blood transfusion has been considered carefully by your caregiver before blood is given. Blood is not given unless the benefits outweigh the risks.

## 2017-11-26 NOTE — Patient Instructions (Signed)
Jillian Chandler  11/26/2017   Your procedure is scheduled on: Thursday 12/09/2017  Report to [facility] Main  Entrance              Report to admitting at  0530  AM    Call this number if you have problems the morning of surgery [phone]    Eat a light diet the day before surgery.  Examples including soups, broths, toast, yogurt, mashed potatoes.  Things to avoid include carbonated beverages (fizzy beverages), raw fruits and raw vegetables, or beans.   If your bowels are filled with gas, your surgeon will have difficulty visualizing your pelvic organs which increases your surgical risks.    CLEAR LIQUID DIET   Foods Allowed                                                                     Foods Excluded  Coffee and tea, regular and decaf                             liquids that you cannot  Plain Jell-O in any flavor                                             see through such as: Fruit ices (not with fruit pulp)                                     milk, soups, orange juice  Iced Popsicles                                    All solid food                                     Cranberry, grape and apple juices Sports drinks like Gatorade Lightly seasoned clear broth or consume(fat free) Sugar, honey syrup  Sample Menu Breakfast                                Lunch                                     Supper Cranberry juice                    Beef broth                            Chicken broth Jell-O  Grape juice                           Apple juice Coffee or tea                        Jell-O                                      Popsicle                                                Coffee or tea                        Coffee or tea  _____________________________________________________________________    Remember: Do not eat food or drink liquids :After Midnight.     Take these medicines the morning of surgery  with A SIP OF WATER: Metoprolol Succinate (Toprol-XL)                                 You may not have any metal on your body including hair pins and              piercings  Do not wear jewelry, make-up, lotions, powders or perfumes, deodorant             Do not wear nail polish.  Do not shave  48 hours prior to surgery.              Men may shave face and neck.   Do not bring valuables to the hospital. [place].  Contacts, dentures or bridgework may not be worn into surgery.  Leave suitcase in the car. After surgery it may be brought to your room.                  Please read over the following fact sheets you were given: _____________________________________________________________________             [facility] - Preparing for Surgery Before surgery, you can play an important role.  Because skin is not sterile, your skin needs to be as free of germs as possible.  You can reduce the number of germs on your skin by washing with CHG (chlorahexidine gluconate) soap before surgery.  CHG is an antiseptic cleaner which kills germs and bonds with the skin to continue killing germs even after washing. Please DO NOT use if you have an allergy to CHG or antibacterial soaps.  If your skin becomes reddened/irritated stop using the CHG and inform your nurse when you arrive at Short Stay. Do not shave (including legs and underarms) for at least 48 hours prior to the first CHG shower.  You may shave your face/neck. Please follow these instructions carefully:  1.  Shower with CHG Soap the night before surgery and the  morning of Surgery.  2.  If you choose to wash your hair, wash your hair first as usual with your  normal  shampoo.  3.  After you shampoo, rinse your  hair and body thoroughly to remove the  shampoo.                           4.  Use CHG as you would any other liquid soap.  You can apply chg directly  to the skin and wash                        Gently with a scrungie or clean washcloth.  5.  Apply the CHG Soap to your body ONLY FROM THE NECK DOWN.   Do not use on face/ open                           Wound or open sores. Avoid contact with eyes, ears mouth and genitals (private parts).                       Wash face,  Genitals (private parts) with your normal soap.             6.  Wash thoroughly, paying special attention to the area where your surgery  will be performed.  7.  Thoroughly rinse your body with warm water from the neck down.  8.  DO NOT shower/wash with your normal soap after using and rinsing off  the CHG Soap.                9.  Pat yourself dry with a clean towel.            10.  Wear clean pajamas.            11.  Place clean sheets on your bed the night of your first shower and do not  sleep with pets. Day of Surgery : Do not apply any lotions/deodorants the morning of surgery.  Please wear clean clothes to the hospital/surgery center.  FAILURE TO FOLLOW THESE INSTRUCTIONS MAY RESULT IN THE CANCELLATION OF YOUR SURGERY PATIENT SIGNATURE_________________________________  NURSE SIGNATURE__________________________________  ________________________________________________________________________   Adam Phenix  An incentive spirometer is a tool that can help keep your lungs clear and active. This tool measures how well you are filling your lungs with each breath. Taking long deep breaths may help reverse or decrease the chance of developing breathing (pulmonary) problems (especially infection) following:  A long period of time when you are unable to move or be active. BEFORE THE PROCEDURE   If the spirometer includes an indicator to show your best effort, your nurse or respiratory therapist will set it to a desired goal.  If possible, sit up straight or lean slightly forward. Try not to slouch.  Hold the incentive spirometer in an upright position. INSTRUCTIONS FOR USE  1. Sit on the edge of  your bed if possible, or sit up as far as you can in bed or on a chair. 2. Hold the incentive spirometer in an upright position. 3. Breathe out normally. 4. Place the mouthpiece in your mouth and seal your lips tightly around it. 5. Breathe in slowly and as deeply as possible, raising the piston or the ball toward the top of the column. 6. Hold your breath for 3-5 seconds or for as long as possible. Allow the piston or ball to fall to the bottom of the column. 7. Remove the mouthpiece from your mouth and breathe out normally. 8. Rest for a few seconds and repeat  Steps 1 through 7 at least 10 times every 1-2 hours when you are awake. Take your time and take a few normal breaths between deep breaths. 9. The spirometer may include an indicator to show your best effort. Use the indicator as a goal to work toward during each repetition. 10. After each set of 10 deep breaths, practice coughing to be sure your lungs are clear. If you have an incision (the cut made at the time of surgery), support your incision when coughing by placing a pillow or rolled up towels firmly against it. Once you are able to get out of bed, walk around indoors and cough well. You may stop using the incentive spirometer when instructed by your caregiver.  RISKS AND COMPLICATIONS  Take your time so you do not get dizzy or light-headed.  If you are in pain, you may need to take or ask for pain medication before doing incentive spirometry. It is harder to take a deep breath if you are having pain. AFTER USE  Rest and breathe slowly and easily.  It can be helpful to keep track of a log of your progress. Your caregiver can provide you with a simple table to help with this. If you are using the spirometer at home, follow these instructions: [place] IF:   You are having difficultly using the spirometer.  You have trouble using the spirometer as often as instructed.  Your pain medication is not giving enough relief  while using the spirometer.  You develop fever of 100.5 F (38.1 C) or higher. SEEK IMMEDIATE MEDICAL CARE IF:   You cough up bloody sputum that had not been present before.  You develop fever of 102 F (38.9 C) or greater.  You develop worsening pain at or near the incision site. MAKE SURE YOU:   Understand these instructions.  Will watch your condition.  Will get help right away if you are not doing well or get worse. Document Released: 11/30/2006 Document Revised: 10/12/2011 Document Reviewed: 01/31/2007 ExitCare Patient Information 2014 ExitCare, [place].   ________________________________________________________________________  WHAT IS A BLOOD TRANSFUSION? Blood Transfusion Information  A transfusion is the replacement of blood or some of its parts. Blood is made up of multiple cells which provide different functions.  Red blood cells carry oxygen and are used for blood loss replacement.  White blood cells fight against infection.  Platelets control bleeding.  Plasma helps clot blood.  Other blood products are available for specialized needs, such as hemophilia or other clotting disorders. BEFORE THE TRANSFUSION  Who gives blood for transfusions?   Healthy volunteers who are fully evaluated to make sure their blood is safe. This is blood bank blood. Transfusion therapy is the safest it has ever been in the practice of medicine. Before blood is taken from a donor, a complete history is taken to make sure that person has no history of diseases nor engages in risky social behavior (examples are intravenous drug use or sexual activity with multiple partners). The donor's travel history is screened to minimize risk of transmitting infections, such as malaria. The donated blood is tested for signs of infectious diseases, such as HIV and hepatitis. The blood is then tested to be sure it is compatible with you in order to minimize the chance of a transfusion reaction. If you or  a relative donates blood, this is often done in anticipation of surgery and is not appropriate for emergency situations. It takes many days to process the donated blood.  RISKS AND COMPLICATIONS Although transfusion therapy is very safe and saves many lives, the main dangers of transfusion include:   Getting an infectious disease.  Developing a transfusion reaction. This is an allergic reaction to something in the blood you were given. Every precaution is taken to prevent this. The decision to have a blood transfusion has been considered carefully by your caregiver before blood is given. Blood is not given unless the benefits outweigh the risks. AFTER THE TRANSFUSION  Right after receiving a blood transfusion, you will usually feel much better and more energetic. This is especially true if your red blood cells have gotten low (anemic). The transfusion raises the level of the red blood cells which carry oxygen, and this usually causes an energy increase.  The nurse administering the transfusion will monitor you carefully for complications. HOME CARE INSTRUCTIONS  No special instructions are needed after a transfusion. You may find your energy is better. Speak with your caregiver about any limitations on activity for underlying diseases you may have. SEEK MEDICAL CARE IF:   Your condition is not improving after your transfusion.  You develop redness or irritation at the intravenous (IV) site. SEEK IMMEDIATE MEDICAL CARE IF:  Any of the following symptoms occur over the next 12 hours:  Shaking chills.  You have a temperature by mouth above 102 F (38.9 C), not controlled by medicine.  Chest, back, or muscle pain.  People around you feel you are not acting correctly or are confused.  Shortness of breath or difficulty breathing.  Dizziness and fainting.  You get a rash or develop hives.  You have a decrease in urine output.  Your urine turns a dark color or changes to pink, red, or  brown. Any of the following symptoms occur over the next 10 days:  You have a temperature by mouth above 102 F (38.9 C), not controlled by medicine.  Shortness of breath.  Weakness after normal activity.  The white part of the eye turns yellow (jaundice).  You have a decrease in the amount of urine or are urinating less often.  Your urine turns a dark color or changes to pink, red, or brown. Document Released: 07/17/2000 Document Revised: 10/12/2011 Document Reviewed: 03/05/2008 [facility] Patient Information 2014 [place], [place].  _______________________________________________________________________

## 2017-11-29 ENCOUNTER — Other Ambulatory Visit (HOSPITAL_COMMUNITY): Payer: Medicare Other

## 2017-11-29 ENCOUNTER — Other Ambulatory Visit: Payer: Self-pay

## 2017-11-29 ENCOUNTER — Encounter (HOSPITAL_COMMUNITY): Payer: Self-pay

## 2017-11-29 ENCOUNTER — Encounter (HOSPITAL_COMMUNITY)
Admission: RE | Admit: 2017-11-29 | Discharge: 2017-11-29 | Disposition: A | Discharge status: Home / Self Care | Payer: Medicare Other | Source: Ambulatory Visit | Attending: Obstetrics | Admitting: Obstetrics

## 2017-11-29 DIAGNOSIS — Z01812 Encounter for preprocedural laboratory examination: Secondary | ICD-10-CM | POA: Diagnosis not present

## 2017-11-29 DIAGNOSIS — Z01810 Encounter for preprocedural cardiovascular examination: Secondary | ICD-10-CM | POA: Insufficient documentation

## 2017-11-29 DIAGNOSIS — R7303 Prediabetes: Secondary | ICD-10-CM | POA: Diagnosis not present

## 2017-11-29 DIAGNOSIS — I10 Essential (primary) hypertension: Secondary | ICD-10-CM | POA: Diagnosis not present

## 2017-11-29 LAB — URINALYSIS, ROUTINE W REFLEX MICROSCOPIC
Bilirubin Urine: NEGATIVE
GLUCOSE, UA: NEGATIVE mg/dL
Hgb urine dipstick: NEGATIVE
KETONES UR: NEGATIVE mg/dL
LEUKOCYTES UA: NEGATIVE
Nitrite: NEGATIVE
PROTEIN: NEGATIVE mg/dL
Specific Gravity, Urine: 1.017 (ref 1.005–1.030)
pH: 6 (ref 5.0–8.0)

## 2017-11-29 LAB — COMPREHENSIVE METABOLIC PANEL
ALBUMIN: 4 g/dL (ref 3.5–5.0)
ALT: 22 U/L (ref 14–54)
ANION GAP: 9 (ref 5–15)
AST: 19 U/L (ref 15–41)
Alkaline Phosphatase: 72 U/L (ref 38–126)
BUN: 18 mg/dL (ref 6–20)
CO2: 26 mmol/L (ref 22–32)
Calcium: 9.7 mg/dL (ref 8.9–10.3)
Chloride: 105 mmol/L (ref 101–111)
Creatinine, Ser: 0.54 mg/dL (ref 0.44–1.00)
GFR calc Af Amer: >60 mL/min (ref >60)
GFR calc non Af Amer: >60 mL/min (ref >60)
GLUCOSE: 107 mg/dL — AB (ref 65–99)
POTASSIUM: 4.2 mmol/L (ref 3.5–5.1)
Sodium: 140 mmol/L (ref 135–145)
Total Bilirubin: 0.3 mg/dL (ref 0.3–1.2)
Total Protein: 7.6 g/dL (ref 6.5–8.1)

## 2017-11-29 LAB — CBC
HCT: 43.3 % (ref 36.0–46.0)
HEMOGLOBIN: 14.3 g/dL (ref 12.0–15.0)
MCH: 28.9 pg (ref 26.0–34.0)
MCHC: 33 g/dL (ref 30.0–36.0)
MCV: 87.5 fL (ref 78.0–100.0)
Platelets: 269 10*3/uL (ref 150–400)
RBC: 4.95 MIL/uL (ref 3.87–5.11)
RDW: 13.3 % (ref 11.5–15.5)
WBC: 8.4 10*3/uL (ref 4.0–10.5)

## 2017-11-29 LAB — ABO/RH: ABO/RH(D): O POS

## 2017-11-29 LAB — HEMOGLOBIN A1C
Hgb A1c MFr Bld: 5.6 % (ref 4.8–5.6)
MEAN PLASMA GLUCOSE: 114.02 mg/dL

## 2017-11-29 NOTE — Progress Notes (Signed)
test

## 2017-12-08 MED ORDER — SODIUM CHLORIDE 0.9 % IV SOLN
2.0000 g | INTRAVENOUS | Status: AC
  Administered 2017-12-09 (×2): 2 g via INTRAVENOUS
  Filled 2017-12-08 (×2): qty 2

## 2017-12-09 ENCOUNTER — Inpatient Hospital Stay (HOSPITAL_COMMUNITY): Payer: Medicare Other | Admitting: Anesthesiology

## 2017-12-09 ENCOUNTER — Other Ambulatory Visit: Payer: Self-pay

## 2017-12-09 ENCOUNTER — Encounter (HOSPITAL_COMMUNITY): Payer: Self-pay | Admitting: Anesthesiology

## 2017-12-09 ENCOUNTER — Encounter (HOSPITAL_COMMUNITY)
Admission: RE | Disposition: A | Discharge status: Home / Self Care | Payer: Self-pay | Source: Ambulatory Visit | Attending: Obstetrics

## 2017-12-09 ENCOUNTER — Ambulatory Visit (HOSPITAL_COMMUNITY)
Admission: RE | Admit: 2017-12-09 | Discharge: 2017-12-09 | Disposition: A | Discharge status: Home / Self Care | Payer: Medicare Other | Source: Ambulatory Visit | Attending: Obstetrics | Admitting: Obstetrics

## 2017-12-09 DIAGNOSIS — R1900 Intra-abdominal and pelvic swelling, mass and lump, unspecified site: Secondary | ICD-10-CM

## 2017-12-09 DIAGNOSIS — N83201 Unspecified ovarian cyst, right side: Secondary | ICD-10-CM | POA: Diagnosis not present

## 2017-12-09 DIAGNOSIS — E119 Type 2 diabetes mellitus without complications: Secondary | ICD-10-CM | POA: Diagnosis not present

## 2017-12-09 DIAGNOSIS — Z853 Personal history of malignant neoplasm of breast: Secondary | ICD-10-CM | POA: Diagnosis not present

## 2017-12-09 DIAGNOSIS — E785 Hyperlipidemia, unspecified: Secondary | ICD-10-CM | POA: Diagnosis not present

## 2017-12-09 DIAGNOSIS — Z791 Long term (current) use of non-steroidal anti-inflammatories (NSAID): Secondary | ICD-10-CM | POA: Diagnosis not present

## 2017-12-09 DIAGNOSIS — Z7951 Long term (current) use of inhaled steroids: Secondary | ICD-10-CM | POA: Insufficient documentation

## 2017-12-09 DIAGNOSIS — I10 Essential (primary) hypertension: Secondary | ICD-10-CM | POA: Insufficient documentation

## 2017-12-09 DIAGNOSIS — Z9012 Acquired absence of left breast and nipple: Secondary | ICD-10-CM | POA: Diagnosis not present

## 2017-12-09 DIAGNOSIS — Z87891 Personal history of nicotine dependence: Secondary | ICD-10-CM | POA: Diagnosis not present

## 2017-12-09 DIAGNOSIS — Z79899 Other long term (current) drug therapy: Secondary | ICD-10-CM | POA: Diagnosis not present

## 2017-12-09 DIAGNOSIS — Z9851 Tubal ligation status: Secondary | ICD-10-CM | POA: Insufficient documentation

## 2017-12-09 HISTORY — PX: ROBOTIC ASSISTED BILATERAL SALPINGO OOPHERECTOMY: SHX6078

## 2017-12-09 LAB — TYPE AND SCREEN
ABO/RH(D): O POS
Antibody Screen: NEGATIVE

## 2017-12-09 SURGERY — SALPINGO-OOPHORECTOMY, BILATERAL, ROBOT-ASSISTED
Anesthesia: General | Laterality: Bilateral

## 2017-12-09 MED ORDER — MIDAZOLAM HCL 2 MG/2ML IJ SOLN
INTRAMUSCULAR | Status: AC
  Filled 2017-12-09: qty 2

## 2017-12-09 MED ORDER — PROPOFOL 10 MG/ML IV BOLUS
INTRAVENOUS | Status: DC | PRN
Start: 2017-12-09 — End: 2017-12-09
  Administered 2017-12-09: 120 mg via INTRAVENOUS

## 2017-12-09 MED ORDER — SUFENTANIL CITRATE 50 MCG/ML IV SOLN
INTRAVENOUS | Status: AC
Start: 2017-12-09 — End: ?
  Filled 2017-12-09: qty 1

## 2017-12-09 MED ORDER — SUFENTANIL CITRATE 50 MCG/ML IV SOLN
INTRAVENOUS | Status: DC | PRN
  Administered 2017-12-09: 5 ug via INTRAVENOUS
  Administered 2017-12-09: 20 ug via INTRAVENOUS
  Administered 2017-12-09: 5 ug via INTRAVENOUS
  Administered 2017-12-09: 10 ug via INTRAVENOUS

## 2017-12-09 MED ORDER — STERILE WATER FOR IRRIGATION IR SOLN
Status: DC | PRN
  Administered 2017-12-09: 1000 mL

## 2017-12-09 MED ORDER — BUPIVACAINE-EPINEPHRINE (PF) 0.5% -1:200000 IJ SOLN
INTRAMUSCULAR | Status: AC
Start: 2017-12-09 — End: ?
  Filled 2017-12-09: qty 30

## 2017-12-09 MED ORDER — OXYCODONE-ACETAMINOPHEN 5-325 MG PO TABS: 1.0000 | ORAL_TABLET | ORAL | 0 refills | Status: DC | PRN

## 2017-12-09 MED ORDER — ROCURONIUM BROMIDE 10 MG/ML (PF) SYRINGE
PREFILLED_SYRINGE | INTRAVENOUS | Status: DC | PRN
  Administered 2017-12-09: 20 mg via INTRAVENOUS
  Administered 2017-12-09: 60 mg via INTRAVENOUS

## 2017-12-09 MED ORDER — SODIUM CHLORIDE 0.9 % IJ SOLN
INTRAMUSCULAR | Status: AC
  Filled 2017-12-09: qty 10

## 2017-12-09 MED ORDER — MIDAZOLAM HCL 2 MG/2ML IJ SOLN
INTRAMUSCULAR | Status: DC | PRN
Start: 2017-12-09 — End: 2017-12-09
  Administered 2017-12-09: 2 mg via INTRAVENOUS

## 2017-12-09 MED ORDER — OXYCODONE HCL 5 MG PO TABS: 5.0000 mg | ORAL_TABLET | Freq: Once | ORAL | Status: DC | PRN

## 2017-12-09 MED ORDER — SUGAMMADEX SODIUM 200 MG/2ML IV SOLN
INTRAVENOUS | Status: AC
Start: 2017-12-09 — End: ?
  Filled 2017-12-09: qty 2

## 2017-12-09 MED ORDER — HYDROMORPHONE HCL 1 MG/ML IJ SOLN
INTRAMUSCULAR | Status: AC
  Administered 2017-12-09: 0.5 mg via INTRAVENOUS
  Filled 2017-12-09: qty 1

## 2017-12-09 MED ORDER — DEXAMETHASONE SODIUM PHOSPHATE 10 MG/ML IJ SOLN
INTRAMUSCULAR | Status: DC | PRN
  Administered 2017-12-09: 10 mg via INTRAVENOUS

## 2017-12-09 MED ORDER — SUGAMMADEX SODIUM 200 MG/2ML IV SOLN
INTRAVENOUS | Status: DC | PRN
  Administered 2017-12-09: 160 mg via INTRAVENOUS

## 2017-12-09 MED ORDER — PROPOFOL 10 MG/ML IV BOLUS
INTRAVENOUS | Status: AC
Start: 2017-12-09 — End: ?
  Filled 2017-12-09: qty 20

## 2017-12-09 MED ORDER — LIDOCAINE 2% (20 MG/ML) 5 ML SYRINGE
INTRAMUSCULAR | Status: DC | PRN
Start: 2017-12-09 — End: 2017-12-09
  Administered 2017-12-09: 100 mg via INTRAVENOUS

## 2017-12-09 MED ORDER — ROCURONIUM BROMIDE 10 MG/ML (PF) SYRINGE
PREFILLED_SYRINGE | INTRAVENOUS | Status: AC
  Filled 2017-12-09: qty 5

## 2017-12-09 MED ORDER — LACTATED RINGERS IV SOLN
INTRAVENOUS | Status: DC
  Administered 2017-12-09 (×2): via INTRAVENOUS

## 2017-12-09 MED ORDER — ONDANSETRON HCL 4 MG/2ML IJ SOLN
INTRAMUSCULAR | Status: DC | PRN
  Administered 2017-12-09: 4 mg via INTRAVENOUS

## 2017-12-09 MED ORDER — EPHEDRINE SULFATE-NACL 50-0.9 MG/10ML-% IV SOSY
PREFILLED_SYRINGE | INTRAVENOUS | Status: DC | PRN
  Administered 2017-12-09 (×2): 10 mg via INTRAVENOUS

## 2017-12-09 MED ORDER — DEXAMETHASONE SODIUM PHOSPHATE 10 MG/ML IJ SOLN
INTRAMUSCULAR | Status: AC
  Filled 2017-12-09: qty 1

## 2017-12-09 MED ORDER — ONDANSETRON HCL 4 MG/2ML IJ SOLN
INTRAMUSCULAR | Status: AC
  Filled 2017-12-09: qty 2

## 2017-12-09 MED ORDER — BUPIVACAINE-EPINEPHRINE 0.5% -1:200000 IJ SOLN
INTRAMUSCULAR | Status: DC | PRN
  Administered 2017-12-09: 17 mL

## 2017-12-09 MED ORDER — EPHEDRINE 5 MG/ML INJ
INTRAVENOUS | Status: AC
  Filled 2017-12-09: qty 10

## 2017-12-09 MED ORDER — HYDROMORPHONE HCL 1 MG/ML IJ SOLN
0.2500 mg | INTRAMUSCULAR | Status: DC | PRN
  Administered 2017-12-09: 0.5 mg via INTRAVENOUS

## 2017-12-09 MED ORDER — LIDOCAINE 2% (20 MG/ML) 5 ML SYRINGE
INTRAMUSCULAR | Status: AC
  Filled 2017-12-09: qty 5

## 2017-12-09 MED ORDER — LACTATED RINGERS IR SOLN
Status: DC | PRN
  Administered 2017-12-09: 1000 mL

## 2017-12-09 MED ORDER — OXYCODONE HCL 5 MG/5ML PO SOLN
5.0000 mg | Freq: Once | ORAL | Status: DC | PRN
  Filled 2017-12-09: qty 5

## 2017-12-09 MED ORDER — ONDANSETRON HCL 4 MG/2ML IJ SOLN: 4.0000 mg | Freq: Four times a day (QID) | INTRAMUSCULAR | Status: DC | PRN

## 2017-12-09 SURGICAL SUPPLY — 84 items
ATTRACTOMAT 16X20 MAGNETIC DRP (DRAPES) ×1 IMPLANT
BAG LAPAROSCOPIC 12 15 PORT 16 (BASKET) ×1 IMPLANT
BAG RETRIEVAL 12/15 (BASKET) ×3
BAG RETRIEVAL 12/15MM (BASKET) ×1
BAG SPEC RTRVL LRG 6X4 10 (ENDOMECHANICALS)
BLADE EXTENDED COATED 6.5IN (ELECTRODE) ×1 IMPLANT
BRR ADH 6X5 SEPRAFILM 1 SHT (MISCELLANEOUS)
CHLORAPREP W/TINT 26ML (MISCELLANEOUS) ×4 IMPLANT
CLIP VESOCCLUDE LG 6/CT (CLIP) ×1 IMPLANT
CLIP VESOCCLUDE MED 6/CT (CLIP) ×1 IMPLANT
CLOSURE WOUND 1/2 X4 (GAUZE/BANDAGES/DRESSINGS) ×1
CONT SPEC 4OZ CLIKSEAL STRL BL (MISCELLANEOUS) ×3 IMPLANT
COVER BACK TABLE 60X90IN (DRAPES) ×4 IMPLANT
COVER TIP SHEARS 8 DVNC (MISCELLANEOUS) ×2 IMPLANT
COVER TIP SHEARS 8MM DA VINCI (MISCELLANEOUS) ×2
DRAPE ARM DVNC X/XI (DISPOSABLE) ×8 IMPLANT
DRAPE COLUMN DVNC XI (DISPOSABLE) ×2 IMPLANT
DRAPE DA VINCI XI ARM (DISPOSABLE) ×8
DRAPE DA VINCI XI COLUMN (DISPOSABLE) ×2
DRAPE INCISE IOBAN 66X45 STRL (DRAPES) IMPLANT
DRAPE SHEET LG 3/4 BI-LAMINATE (DRAPES) ×8 IMPLANT
DRAPE SURG IRRIG POUCH 19X23 (DRAPES) ×4 IMPLANT
DRAPE UNDERBUTTOCKS STRL (DRAPE) ×4 IMPLANT
DRAPE WARM FLUID 44X44 (DRAPE) ×1 IMPLANT
ELECT REM PT RETURN 15FT ADLT (MISCELLANEOUS) ×4 IMPLANT
GAUZE 4X4 16PLY RFD (DISPOSABLE) IMPLANT
GAUZE SPONGE 2X2 8PLY STRL LF (GAUZE/BANDAGES/DRESSINGS) ×1 IMPLANT
GAUZE SPONGE 4X4 12PLY STRL (GAUZE/BANDAGES/DRESSINGS) ×1 IMPLANT
GLOVE BIO SURGEON STRL SZ 6 (GLOVE) ×16 IMPLANT
GLOVE BIO SURGEON STRL SZ 6.5 (GLOVE) ×6 IMPLANT
GLOVE BIO SURGEONS STRL SZ 6.5 (GLOVE) ×2
GLOVE BIOGEL PI IND STRL 7.0 (GLOVE) ×4 IMPLANT
GLOVE BIOGEL PI INDICATOR 7.0 (GLOVE) ×4
GLOVE SURG SS PI 6.5 STRL IVOR (GLOVE) ×8 IMPLANT
GOWN STRL REUS W/ TWL LRG LVL3 (GOWN DISPOSABLE) ×6 IMPLANT
GOWN STRL REUS W/TWL LRG LVL3 (GOWN DISPOSABLE) ×16 IMPLANT
HOLDER FOLEY CATH W/STRAP (MISCELLANEOUS) ×4 IMPLANT
IRRIG SUCT STRYKERFLOW 2 WTIP (MISCELLANEOUS) ×4
IRRIGATION SUCT STRKRFLW 2 WTP (MISCELLANEOUS) ×2 IMPLANT
KIT BASIN OR (CUSTOM PROCEDURE TRAY) ×1 IMPLANT
LIGASURE IMPACT 36 18CM CVD LR (INSTRUMENTS) ×1 IMPLANT
MANIPULATOR UTERINE 4.5 ZUMI (MISCELLANEOUS) IMPLANT
NEEDLE HYPO 22GX1.5 SAFETY (NEEDLE) ×5 IMPLANT
NS IRRIG 1000ML POUR BTL (IV SOLUTION) ×2 IMPLANT
OBTURATOR OPTICAL STANDARD 8MM (TROCAR)
OBTURATOR OPTICAL STND 8 DVNC (TROCAR)
OBTURATOR OPTICALSTD 8 DVNC (TROCAR) ×1 IMPLANT
PACK GENERAL/GYN (CUSTOM PROCEDURE TRAY) ×1 IMPLANT
PACK ROBOT GYN CUSTOM WL (TRAY / TRAY PROCEDURE) ×4 IMPLANT
PAD POSITIONING PINK XL (MISCELLANEOUS) ×4 IMPLANT
POUCH SPECIMEN RETRIEVAL 10MM (ENDOMECHANICALS) IMPLANT
RETAINER VISCERA MED (MISCELLANEOUS) IMPLANT
SEAL CANN UNIV 5-8 DVNC XI (MISCELLANEOUS) ×8 IMPLANT
SEAL XI 5MM-8MM UNIVERSAL (MISCELLANEOUS) ×8
SEPRAFILM MEMBRANE 5X6 (MISCELLANEOUS) IMPLANT
SET TRI-LUMEN FLTR TB AIRSEAL (TUBING) IMPLANT
SHEET LAVH (DRAPES) ×1 IMPLANT
SOLUTION ELECTROLUBE (MISCELLANEOUS) ×4 IMPLANT
SPONGE GAUZE 2X2 STER 10/PKG (GAUZE/BANDAGES/DRESSINGS) ×2
SPONGE LAP 18X18 RF (DISPOSABLE) IMPLANT
STRIP CLOSURE SKIN 1/2X4 (GAUZE/BANDAGES/DRESSINGS) ×2 IMPLANT
SUCTION POOLE TIP (SUCTIONS) IMPLANT
SUT MNCRL AB 4-0 PS2 18 (SUTURE) ×4 IMPLANT
SUT PDS AB 1 TP1 96 (SUTURE) ×2 IMPLANT
SUT PLAIN 2 0 XLH (SUTURE) IMPLANT
SUT SILK 2 0 (SUTURE)
SUT SILK 2-0 18XBRD TIE 12 (SUTURE) IMPLANT
SUT VIC AB 0 CT1 18XCR BRD 8 (SUTURE) ×1 IMPLANT
SUT VIC AB 0 CT1 27 (SUTURE)
SUT VIC AB 0 CT1 27XBRD ANTBC (SUTURE) IMPLANT
SUT VIC AB 0 CT1 36 (SUTURE) ×6 IMPLANT
SUT VIC AB 0 CT1 8-18 (SUTURE)
SUT VIC AB 4-0 PS2 18 (SUTURE) ×8 IMPLANT
SUT VICRYL 0 TIES 12 18 (SUTURE) ×1 IMPLANT
SYR 30ML LL (SYRINGE) ×2 IMPLANT
SYS RETRIEVAL 5MM INZII UNIV (BASKET) ×4
SYSTEM RETRIEVL 5MM INZII UNIV (BASKET) ×1 IMPLANT
TOWEL OR 17X26 10 PK STRL BLUE (TOWEL DISPOSABLE) ×4 IMPLANT
TOWEL OR NON WOVEN STRL DISP B (DISPOSABLE) ×4 IMPLANT
TRAP SPECIMEN MUCOUS 40CC (MISCELLANEOUS) ×3 IMPLANT
TRAY FOLEY MTR SLVR 14FR STAT (SET/KITS/TRAYS/PACK) ×1 IMPLANT
TRAY FOLEY MTR SLVR 16FR STAT (SET/KITS/TRAYS/PACK) ×4 IMPLANT
UNDERPAD 30X30 (UNDERPADS AND DIAPERS) ×4 IMPLANT
WATER STERILE IRR 1000ML POUR (IV SOLUTION) ×4 IMPLANT

## 2017-12-09 NOTE — Op Note (Signed)
OPERATIVE NOTE  Date: 12/09/17  Preoperative Diagnosis: Pelvic mass  Postoperative Diagnosis: Right ovarian cyst  Procedure(s) Performed: Robotic-assisted laparoscopic bilateral salpingo-oophorectomy, Pelvic washings  Surgeon: Bernita Raisin, MD  Assistant Surgeon: Lahoma Crocker M.D. (an MD assistant was necessary for tissue manipulation, management of robotic instrumentation, retraction and positioning due to the complexity of the case and hospital policies).   Anesthesia: GETA  Specimens: Bilateral ovaries, bilateral fallopian tubes doubly incomplete due to history of tubal ligation, pelvic washings  Complications: None  Indication for Procedure: Imaging revealed an 11 cm pelvic mass  Operative Findings: Right ovary attenuated around approximate the 10 cm cystic lesion.  Normal left ovary.  [place] pathology was consistent with benign mucinous cystadenoma  Procedure:  The patient was positioned supine with her arms at her sides prior to anesthesia to ensure comfort. The patient was taken to the operating room and placed under general endotracheal anesthesia without difficulty. She was placed in a dorsolithotomy position and cervical acromial pads were placed. The patient had sequential compression devices for VTE prophylaxis.  The patient was then prepped in the usual sterile fashion.  Time out was performed.   A Foley catheter was placed by me.  Speculum was inserted into the vagina and the cervix grasped with a tenaculum clamp.  The uterus was sounded to approximately 6 cm.  A Hulka clamp was placed and the tenaculum was removed.  Attention was then turned to the abdomen.  A 4mm incision was made in the left upper quadrant palmer's point and a 5 mm Optiview trocar used to enter the abdomen under direct visualization. With entry into the abdomen and then maintenance of 15 mm of mercury the patient was placed in Trendelenburg position. An incision was made superior to the  umbilicus and used for an 5mm trocar. To the right of this 2 additional 13mm trocars were placed approximately 6-8 cm from the closest trocar. An 71mm trocar was also placed in the left lateral abdominal wall. 8 mm robotic trochars were inserted. The 67mm LUQ trocar was changed out to 71mm airseal. The robot was docked.  The abdomen was inspected as was the pelvis.  Findings as noted. Pelvic washings were obtained. The mass/cyst was noted on the right. An incision was made on the right pelvic side wall peritoneum parallel to the IP ligament and the retroperitoneal space entered. The right ureter was identified and the para-rectal space was developed. A window was created in the broad ligament above the ureter. The infundibulopelvic vessels were skeletonized cauterized and transected. The right utero-ovarian ligaments similarly were cauterized and transected.   In a similar manner the contralateral adnexa was isolated, the spaces opened, the ureter identified, and the IP ligament isolated, clamped, coagulated and transected.  The adnexa were placed in a separate Endo catch bags.   The operative sites were inspected and hemostasis was noted.   The robot was undocked. The bag holding the left normal ovary was removed through the 10 mm trocar site. The bag holding the right ovary was brought to the same trocar site and the bag opened. The cyst was visualized and aspirated noting we are fluid. The mass was then morcellated to facilitate removal from the abdominal cavity.  The fascial closure at the left upper quadrant port was used with 0 Vicryl using a laparoscopic closing device.  The ports were all removed. All incisions were closed with interrupted 4-0 vicryl and running subcuticular 4-0 Monocryl suture.  Steri-Strips and benzoin were applied.  Sponge, lap and needle counts were correct per protocol.          Disposition: PACU then plan for home  Condition: Stable

## 2017-12-09 NOTE — Progress Notes (Signed)
Status interperter Caren Griffins # [phone]

## 2017-12-09 NOTE — Discharge Instructions (Addendum)
Salpingooferectoma bilateral - Cuidados posteriores (Bilateral Salpingo-Oophorectomy, Care After) Siga estas instrucciones durante las prximas semanas. Estas indicaciones le proporcionan informacin general acerca de cmo deber cuidarse despus del procedimiento. El mdico tambin podr darle instrucciones ms especficas. El tratamiento se ha planificado de acuerdo a las prcticas mdicas actuales, pero a veces se producen problemas. Comunquese con el mdico si tiene algn problema o tiene dudas despus del procedimiento. QU ESPERAR DESPUS DEL PROCEDIMIENTO Despus del procedimiento, es tpico tener las siguientes sensaciones:   Dolor abdominal que puede controlarse con medicamentos.  Prdida o hemorragia vaginal.  Estreimiento.  Sntomas menopusicos como calores, sequedad vaginal y cambios de humor. INSTRUCCIONES PARA EL CUIDADO EN EL HOGAR   Descanse y duerma lo suficiente.  Tome slo medicamentos de venta libre o recetados, segn las indicaciones del mdico. No tome aspirina. Puede ocasionar hemorragias.  [place] y secas. Retire o [employer] apsitos (vendajes) slo como le indic su mdico.  Tome slo duchas y no baos, durante algunas semanas, segn las indicaciones de su mdico.  Limite las actividades segn las indicaciones del mdico. No levante ningn objeto que sea ms pesado que 5 libras (2.3 kg) hasta que el mdico la autorice.  No conduzca vehculos hasta que el mdico lo autorice.  Siga las indicaciones de su mdico con respecto al tratamiento para el asma antes de hacer actividad fsica. Puede retomar su dieta habitual inmediatamente.  Beba suficiente lquido para Consulting civil engineer orina clara o de color amarillo plido.  No se haga duchas vaginales ni tenga relaciones sexuales durante las 6 semanas siguientes a la [place].  No beba alcohol hasta que el mdico la autorice.  Tmese la [employer] veces por da y  Chartered certified accountant.  Si est constipada podr:  ? Consultar a su mdico si puede tomar un laxante suave. ? Agregar frutas y salvado a su dieta. ? Beber ms lquidos.  Concurra a las consultas de control con su mdico segn las indicaciones. SOLICITE ATENCIN MDICA SI:   La zona de la incisin est roja, se hincha o Engineer, water.  Tiene pus en el sitio de la incisin.  Advierte un olor ftido que proviene de la herida o del vendaje.  Tiene dolor, enrojecimiento o hinchazn en la zona en la que fue colocada la va intravenosa.  La incisin se ha abierto (los bordes no se mantienen juntos).  Se siente mareada o sufre un desmayo.  Siente dolor o tiene una hemorragia al [employer].  Tiene diarrea.  Presenta nuseas o vmitos.  Margette Fast hemorragia vaginal anormal.  Le aparece una erupcin cutnea.  Aumenta el dolor y no puede controlarlo con Conservation officer, nature. SOLICITE ATENCIN MDICA DE INMEDIATO SI:   Tiene fiebre.  Siente dolor abdominal.  Siente dolor en el pecho.  Le falta el aire.  Se desmaya.  Siente dolor, u observa hinchazn o enrojecimiento en la pierna.  Tiene una hemorragia vaginal abundante, con o sin cogulos. Esta informacin no tiene Marine scientist el consejo del mdico. Asegrese de hacerle al mdico cualquier pregunta que tenga. Document Released: 10/14/2009 Document Revised: 03/22/2013 Elsevier Interactive Patient Education  2017 [employer].

## 2017-12-09 NOTE — Anesthesia Postprocedure Evaluation (Signed)
Anesthesia Post Note  Patient: Jillian Chandler  Procedure(s) Performed: XI ROBOTIC ASSISTED BILATERAL SALPINGO OOPHORECTOMY (Bilateral )     Patient location during evaluation: PACU Anesthesia Type: General Level of consciousness: awake and alert Pain management: pain level controlled Vital Signs Assessment: post-procedure vital signs reviewed and stable Respiratory status: spontaneous breathing, nonlabored ventilation, respiratory function stable and patient connected to nasal cannula oxygen Cardiovascular status: blood pressure returned to baseline and stable Postop Assessment: no apparent nausea or vomiting Anesthetic complications: no    Last Vitals:  Vitals:   12/09/17 1045 12/09/17 1100  BP: 125/68 140/70  Pulse: 82 73  Resp: (!) 26 20  Temp: 36.5 C 36.6 C  SpO2: 100% 99%    Last Pain:  Vitals:   12/09/17 1030  TempSrc:   PainSc: 0-No pain                 Amayia Ciano S

## 2017-12-09 NOTE — Anesthesia Procedure Notes (Signed)
Procedure Name: Intubation Date/Time: 12/09/2017 7:41 AM Performed by: Sharlette Dense, CRNA Patient Re-evaluated:Patient Re-evaluated prior to induction Oxygen Delivery Method: Circle system utilized Preoxygenation: Pre-oxygenation with 100% oxygen Induction Type: IV induction Ventilation: Mask ventilation without difficulty and Oral airway inserted - appropriate to patient size Laryngoscope Size: Sabra Heck and 2 Grade View: Grade I Tube type: Oral Tube size: 7.5 mm Number of attempts: 1 Airway Equipment and Method: Stylet Placement Confirmation: ETT inserted through vocal cords under direct vision,  positive ETCO2 and breath sounds checked- equal and bilateral Secured at: 21 cm Tube secured with: Tape Dental Injury: Teeth and Oropharynx as per pre-operative assessment

## 2017-12-09 NOTE — Transfer of Care (Signed)
Immediate Anesthesia Transfer of Care Note  Patient: Jillian Chandler  Procedure(s) Performed: XI ROBOTIC ASSISTED BILATERAL SALPINGO OOPHORECTOMY (Bilateral )  Patient Location: PACU  Anesthesia Type:General  Level of Consciousness: awake, alert  and oriented  Airway & Oxygen Therapy: Patient Spontanous Breathing and Patient connected to face mask oxygen  Post-op Assessment: Report given to RN and Post -op Vital signs reviewed and stable  Post vital signs: Reviewed and stable  Last Vitals:  Vitals Value Taken Time  BP 135/69 12/09/2017  9:46 AM  Temp    Pulse 95 12/09/2017  9:47 AM  Resp 19 12/09/2017  9:47 AM  SpO2 100 % 12/09/2017  9:47 AM  Vitals shown include unvalidated device data.  Last Pain:  Vitals:   12/09/17 0548  TempSrc: Oral         Complications: No apparent anesthesia complications

## 2017-12-09 NOTE — Interval H&P Note (Signed)
History and Physical Interval Note:  12/09/2017 7:06 AM  Jillian Chandler  has presented today for surgery, with the diagnosis of PELVIC MASS  The various methods of treatment have been discussed with the patient and family. After consideration of risks, benefits and other options for treatment, the patient has consented to  Procedure(s): XI ROBOTIC ASSISTED BILATERAL SALPINGO OOPHORECTOMY, POSSIBLE EXPLORATORY LAPAROTOMY, POSSIBLE TOTAL ABDOMINAL HYSTERECTOMY, POSSIBLE STAGING (Bilateral) POSSIBLE HYSTERECTOMY ABDOMINAL (N/A) POSSIBLE EXPLORATORY LAPAROTOMY WITH STAGING (N/A) as a surgical intervention .  The patient's history has been reviewed, patient examined, no change in status, stable for surgery.  I have reviewed the patient's chart and labs.  Questions were answered to the patient's satisfaction.     Isabel Caprice

## 2017-12-09 NOTE — Anesthesia Preprocedure Evaluation (Signed)
Anesthesia Evaluation  Patient identified by MRN, date of birth, ID band Patient awake    Reviewed: Allergy & Precautions, H&P , NPO status , Patient's Chart, lab work & pertinent test results  Airway Mallampati: II   Neck ROM: full    Dental   Pulmonary former smoker,    breath sounds clear to auscultation       Cardiovascular hypertension,  Rhythm:regular Rate:Normal     Neuro/Psych  Headaches,    GI/Hepatic PUD,   Endo/Other    Renal/GU      Musculoskeletal  (+) Arthritis ,   Abdominal   Peds  Hematology   Anesthesia Other Findings   Reproductive/Obstetrics H/o breast CA s/p mastectomy                             Anesthesia Physical Anesthesia Plan  ASA: II  Anesthesia Plan: General   Post-op Pain Management:    Induction: Intravenous  PONV Risk Score and Plan: 3 and Ondansetron, Dexamethasone, Midazolam and Treatment may vary due to age or medical condition  Airway Management Planned: Oral ETT  Additional Equipment:   Intra-op Plan:   Post-operative Plan: Extubation in OR  Informed Consent: I have reviewed the patients History and Physical, chart, labs and discussed the procedure including the risks, benefits and alternatives for the proposed anesthesia with the patient or authorized representative who has indicated his/her understanding and acceptance.     Plan Discussed with: CRNA, Anesthesiologist and Surgeon  Anesthesia Plan Comments:         Anesthesia Quick Evaluation

## 2017-12-10 ENCOUNTER — Encounter (HOSPITAL_COMMUNITY): Payer: Self-pay | Admitting: Obstetrics

## 2017-12-20 ENCOUNTER — Inpatient Hospital Stay: Payer: Medicare Other | Attending: Obstetrics | Admitting: Obstetrics

## 2017-12-20 ENCOUNTER — Encounter: Payer: Self-pay | Admitting: Obstetrics

## 2017-12-20 VITALS — BP 131/67 | HR 79 | Temp 98.1°F | Resp 20 | Ht 66.0 in | Wt 159.6 lb

## 2017-12-20 DIAGNOSIS — R1900 Intra-abdominal and pelvic swelling, mass and lump, unspecified site: Secondary | ICD-10-CM

## 2017-12-20 NOTE — Patient Instructions (Signed)
1. Return in one month for a final check of your incisions to see how you are doing  2. Restrict your activities until then to walking. No heavy lifting, pulling, pushing.

## 2017-12-22 ENCOUNTER — Encounter: Payer: Self-pay | Admitting: Obstetrics

## 2017-12-22 NOTE — Progress Notes (Signed)
Consult Note: Gyn-Onc  Consult was requested by Dr. Dellis Filbert for the evaluation of Jillian Chandler 67 y.o. female  CC:  Chief Complaint  Patient presents with  . Pelvic mass in female    HPI: Ms. Jillian Chandler  is a very nice 67 y.o. P3  She noticed significant mid to right lower pelvic pain and pressure.  She presented 11/09/2017 and was treated for a possible urinary tract infection.  In addition a pelvic ultrasound was performed as noted below revealing a 10.6 x 8.6 x 11.7 left complex cystic lesion in the adenxa.  She was referred on to Dr. Dellis Filbert for further workup.  A Pap smear was performed which is currently still pending.  CA 125 and CEA were also performed and those are both still pending.  The patient states her pain improved since that time.  She denies nausea and vomiting changes in her bowel movements and she does admit to some urinary frequency and states that she has lost 5 pounds over states this is been over 8 months and somewhat intentional with increased activity.  Likely unrelated she notes cold at times.  She notes to me that she has low-cholesterol but is noncompliant with the medication because it makes her feel dizzy.  Does take meloxicam for arthritis.  Given the complex nature of the adnexal mass she was referred to [place] for management.  In addition she has a personal history of breast cancer.  She denies any use of tamoxifen.  She attempted Arimidex use but did not tolerate due to worsening of her arthritis.  On 12/09/2017 I did take her to the operating room at which time RA laparoscopic BSO and washings was performed.  Intra-Op findings included a right ovary that was attenuated around a 10 cm cystic lesion and a normal left ovary.  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  Final pathology was also consistent with a benign mucinous cyst of the right ovary bilateral benign fallopian tubes and a benign left ovary.  Washings were negative.  The patient  returns for an early postoperative check.  She is doing well.  She denies fever denies nausea and vomiting.  Her bowel and bladder functioning fairly normal for her.  She is now off of her prescription pain medication.  She has some left upper quadrant pain at the site of the 12 mm trocar where a suture was placed in the fascia.  Otherwise no complaints.   Radiology: . Pelvic ultrasound 11/09/2017 bone imaging-uterus 7 x 3 x 3.5 normal endometrium 2.8 mm.  Right ovary somewhat difficult to visualize but grossly normal.  Left ovary large complex cystic lesion 1.6 x 8.6 x 11.7 with multiple scattered internal septations and low level echoes.  No internal vascularity.  Trace free fluid.    Oncologic History: Pending further workup    No history exists.    Current Meds:  Outpatient Encounter Medications as of 12/20/2017  Medication Sig  . acetaminophen (TYLENOL) 500 MG tablet Take 1 tablet (500 mg total) by mouth every 6 (six) hours as needed.  Marland Kitchen b complex vitamins capsule Take 1 capsule by mouth daily.  . Cholecalciferol (VITAMIN D3 PO) Take 3 tablets by mouth daily before breakfast.   . lisinopril-hydrochlorothiazide (PRINZIDE,ZESTORETIC) 20-25 MG tablet Take 1 tablet by mouth daily. (Patient taking differently: Take 1 tablet by mouth daily before breakfast. )  . metoprolol succinate (TOPROL-XL) 50 MG 24 hr tablet TAKE 1 TABLET BY MOUTH ONCE DAILY.TAKE WITH OR IMMEDIATELY FOLLOWING  A MEAL  . Omega-3 Fatty Acids (OMEGA 3 PO) Take 2 capsules by mouth daily.  Marland Kitchen pyridOXINE (VITAMIN B-6) 100 MG tablet Take 100 mg by mouth daily before breakfast.  . vitamin E 400 UNIT capsule Take 1,200 Units by mouth daily before breakfast.   . [DISCONTINUED] oxyCODONE-acetaminophen (PERCOCET/ROXICET) 5-325 MG tablet Take 1-2 tablets by mouth every 4 (four) hours as needed for severe pain. (Patient not taking: Reported on 12/20/2017)   No facility-administered encounter medications on file as of 12/20/2017.      Allergy: No Known Allergies  Social Hx:   Social History   Socioeconomic History  . Marital status: Married    Spouse name: Not on file  . Number of children: 4  . Years of education: Not on file  . Highest education level: Not on file  Occupational History  . 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 Financial resource strain: Not on file  . Food insecurity:    Worry: Not on file    Inability: Not on file  . Transportation needs:    Medical: Not on file    Non-medical: Not on file  Tobacco Use  . Smoking status: Former Smoker    Last attempt to quit: 02/17/2007    Years since quitting: 10.8  . Smokeless tobacco: Never Used  Substance and Sexual Activity  . Alcohol use: No  . Drug use: No  . Sexual activity: Not Currently  Lifestyle  . Physical activity:    Days per week: Not on file    Minutes per session: Not on file  . Stress: Not on file  Relationships  . Social connections:    Talks on phone: Not on file    Gets together: Not on file    Attends religious service: Not on file    Active member of club or organization: Not on file    Attends meetings of clubs or organizations: Not on file    Relationship status: Not on file  . Intimate partner violence:    Fear of current or ex partner: Not on file    Emotionally abused: Not on file    Physically abused: Not on file    Forced sexual activity: Not on file  Other Topics Concern  . Not on file  Social History Narrative   Original from [place] ---   Diet: very  healthy   Exercise- not frequently but active at work                Past Surgical Hx:  Past Surgical History:  Procedure Laterality Date  . COLONOSCOPY W/ POLYPECTOMY  11/2011  . MASTECTOMY Left 04/2009  . ROBOTIC ASSISTED BILATERAL SALPINGO OOPHERECTOMY Bilateral 12/09/2017   Procedure: XI ROBOTIC ASSISTED BILATERAL SALPINGO OOPHORECTOMY;  Surgeon: Isabel Caprice, MD;  Location: [facility];  Service: Gynecology;   Laterality: Bilateral;    Past Medical Hx:  Past Medical History:  Diagnosis Date  . Breast cancer ([facility]) 01/2009   left brest- status post mastectomy, intolerant to Arimidex, declined tamoxfien  . Gastric ulcer    CLO (-) per EGD ~ 11-2011  . Hyperlipidemia   . Hypertension 2006    Past Gynecological History:   GYNECOLOGIC HISTORY:  No LMP recorded. Patient is postmenopausal. Menarche: 67 years old P 66 LMP 67 years old Contraceptive none HRT none  Last Pap 2013- with negative HPV repeated 2019 currently pending  Family Hx:  Family History  Problem Relation  Age of Onset  . Heart attack Father 5       F age 68, M age 34s  . Heart disease Father   . Diabetes Sister   . Diabetes Brother   . Diabetes Sister   . Heart disease Mother   . Diabetes Maternal Aunt   . Diabetes Maternal Uncle   . Diabetes Paternal Aunt   . Diabetes Paternal Uncle   . Diabetes Maternal Grandmother   . Diabetes Maternal Grandfather   . Diabetes Paternal Grandmother   . Diabetes Paternal Grandfather   . Stroke Neg Hx   . Colon cancer Neg Hx   . Breast cancer Neg Hx     Review of Systems:  As in HPI.  Vitals:  Blood pressure 131/67, pulse 79, temperature 98.1 F (36.7 C), temperature source Oral, resp. rate 20, height 5\' 6"  (1.676 m), weight 159 lb 9.6 oz (72.4 kg), SpO2 96 %. Body mass index is 25.76 kg/m.   Physical Exam:   General :  Well developed, 67 y.o., female in no apparent distress HEENT:  Normocephalic/atraumatic, symmetric, EOMI, eyelids normal Neck:   No visible masses.  Respiratory:  Respirations unlabored, no use of accessory muscles CV:   Deferred Breast:  Deferred Musculoskeletal: Normal muscle strength. Abdomen:  Wounds are clean dry and intact and healing with Steri-Strips mostly in place.  Nontender on exam.  No visible masses or protrusion Extremities:  No visible edema or deformities Skin:   Normal inspection Neuro/Psych:  No focal motor deficit, no abnormal  mental status. Normal gait. Normal affect. Alert and oriented to person, place, and time      Oncologic Summary: 1. Pelvic mass complex in nature in a postmenopausal patient   Assessment/Plan: Thankfully pathology was benign She is to return in approximately 1 month for a final postoperative check and then will be dispositioned back to Dr. Dellis Filbert and her primary care provider. The pain in the left upper quadrant is likely due to the suture placed across the fascia of the 12 mm trocar and that should improve with time. Activity restrictions were reinforced.  Isabel Caprice, MD  12/22/2017, 2:10 PM  Cc: Princess Bruins, MD Mackie Pai, PA-C

## 2018-01-16 NOTE — Progress Notes (Signed)
Consult Note: Gyn-Onc  Consult was requested by Dr. Dellis Filbert for the evaluation of Jillian Chandler 67 y.o. female  CC:  Chief Complaint  Patient presents with  . Pelvic mass in female    HPI: Jillian Chandler  is a very nice 67 y.o. P3  She noticed significant mid to right lower pelvic pain and pressure.  She presented 11/09/2017 and was treated for a possible urinary tract infection.  In addition a pelvic ultrasound was performed as noted below revealing a 10.6 x 8.6 x 11.7 left complex cystic lesion in the adenxa.  She was referred on to Dr. Dellis Filbert for further workup.  A Pap smear was performed which is currently still pending.  CA 125 and CEA were also performed and those are both still pending.  The patient states her pain improved since that time.  She denies nausea and vomiting changes in her bowel movements and she does admit to some urinary frequency and states that she has lost 5 pounds over states this is been over 8 months and somewhat intentional with increased activity.  Likely unrelated she notes cold at times.  She notes to me that she has low-cholesterol but is noncompliant with the medication because it makes her feel dizzy.  Does take meloxicam for arthritis.  Given the complex nature of the adnexal mass she was referred to [place] for management.  In addition she has a personal history of breast cancer.  She denies any use of tamoxifen.  She attempted Arimidex use but did not tolerate due to worsening of her arthritis.  On 12/09/2017 I did take her to the operating room at which time RA laparoscopic BSO and washings was performed.  Intra-Op findings included a right ovary that was attenuated around a 10 cm cystic lesion and a normal left ovary.  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  Final pathology was also consistent with a benign mucinous cyst of the right ovary bilateral benign fallopian tubes and a benign left ovary.  Washings were negative.  The patient  returns for a final postoperative check. She is doing well and is without complaint  Radiology: . Pelvic ultrasound 11/09/2017 bone imaging-uterus 7 x 3 x 3.5 normal endometrium 2.8 mm.  Right ovary somewhat difficult to visualize but grossly normal.  Left ovary large complex cystic lesion 1.6 x 8.6 x 11.7 with multiple scattered internal septations and low level echoes.  No internal vascularity.  Trace free fluid.    Oncologic History: Pending further workup    No history exists.    Current Meds:  Outpatient Encounter Medications as of 01/17/2018  Medication Sig  . acetaminophen (TYLENOL) 500 MG tablet Take 1 tablet (500 mg total) by mouth every 6 (six) hours as needed.  Marland Kitchen b complex vitamins capsule Take 1 capsule by mouth daily.  . Cholecalciferol (VITAMIN D3 PO) Take 3 tablets by mouth daily before breakfast.   . lisinopril-hydrochlorothiazide (PRINZIDE,ZESTORETIC) 20-25 MG tablet Take 1 tablet by mouth daily. (Patient taking differently: Take 1 tablet by mouth daily before breakfast. )  . metoprolol succinate (TOPROL-XL) 50 MG 24 hr tablet TAKE 1 TABLET BY MOUTH ONCE DAILY.TAKE WITH OR IMMEDIATELY FOLLOWING A MEAL  . Omega-3 Fatty Acids (OMEGA 3 PO) Take 2 capsules by mouth daily.  Marland Kitchen pyridOXINE (VITAMIN B-6) 100 MG tablet Take 100 mg by mouth daily before breakfast.  . vitamin E 400 UNIT capsule Take 1,200 Units by mouth daily before breakfast.    No facility-administered encounter medications  on file as of 01/17/2018.     Allergy: No Known Allergies  Social Hx:   Social History   Socioeconomic History  . Marital status: Married    Spouse name: Not on file  . Number of children: 4  . Years of education: Not on file  . Highest education level: Not on file  Occupational History  . 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 Financial resource strain: Not on file  . Food insecurity:    Worry: Not on file    Inability: Not on file  .  Transportation needs:    Medical: Not on file    Non-medical: Not on file  Tobacco Use  . Smoking status: Former Smoker    Last attempt to quit: 02/17/2007    Years since quitting: 10.9  . Smokeless tobacco: Never Used  Substance and Sexual Activity  . Alcohol use: No  . Drug use: No  . Sexual activity: Not Currently  Lifestyle  . Physical activity:    Days per week: Not on file    Minutes per session: Not on file  . Stress: Not on file  Relationships  . Social connections:    Talks on phone: Not on file    Gets together: Not on file    Attends religious service: Not on file    Active member of club or organization: Not on file    Attends meetings of clubs or organizations: Not on file    Relationship status: Not on file  . Intimate partner violence:    Fear of current or ex partner: Not on file    Emotionally abused: Not on file    Physically abused: Not on file    Forced sexual activity: Not on file  Other Topics Concern  . Not on file  Social History Narrative   Original from [place] ---   Diet: very  healthy   Exercise- not frequently but active at work                Past Surgical Hx:  Past Surgical History:  Procedure Laterality Date  . COLONOSCOPY W/ POLYPECTOMY  11/2011  . MASTECTOMY Left 04/2009  . ROBOTIC ASSISTED BILATERAL SALPINGO OOPHERECTOMY Bilateral 12/09/2017   Procedure: XI ROBOTIC ASSISTED BILATERAL SALPINGO OOPHORECTOMY;  Surgeon: Jillian Caprice, MD;  Location: [facility];  Service: Gynecology;  Laterality: Bilateral;    Past Medical Hx:  Past Medical History:  Diagnosis Date  . Breast cancer ([facility]) 01/2009   left brest- status post mastectomy, intolerant to Arimidex, declined tamoxfien  . Gastric ulcer    CLO (-) per EGD ~ 11-2011  . Hyperlipidemia   . Hypertension 2006    Past Gynecological History:   GYNECOLOGIC HISTORY:  No LMP recorded. Patient is postmenopausal. Menarche: 67 years old P 50 LMP 67 years old Contraceptive none HRT  none  Last Pap 2013- with negative HPV repeated 2019 currently pending  Family Hx:  Family History  Problem Relation Age of Onset  . Heart attack Father 46       F age 63, M age 57s  . Heart disease Father   . Diabetes Sister   . Diabetes Brother   . Diabetes Sister   . Heart disease Mother   . Diabetes Maternal Aunt   . Diabetes Maternal Uncle   . Diabetes Paternal Aunt   . Diabetes Paternal Uncle   . Diabetes Maternal Grandmother   . Diabetes Maternal  Grandfather   . Diabetes Paternal Grandmother   . Diabetes Paternal Grandfather   . Stroke Neg Hx   . Colon cancer Neg Hx   . Breast cancer Neg Hx     Review of Systems:  Review of Systems  Constitutional: Negative.   HENT:  Negative.   Eyes: Negative.   Respiratory: Negative.   Cardiovascular: Negative.   Gastrointestinal: Negative.   Endocrine: Negative.   Genitourinary: Negative.    Musculoskeletal: Negative.   Skin: Negative.   Neurological: Negative.   Hematological: Negative.   Psychiatric/Behavioral: Negative.      Vitals:  Blood pressure 130/66, pulse 65, temperature 97.9 F (36.6 C), temperature source Oral, resp. rate 20, height 5\' 6"  (1.676 m), weight 165 lb 3.2 oz (74.9 kg), SpO2 97 %. Body mass index is 26.66 kg/m.   Physical Exam:   General :  Well developed, 68 y.o., female in no apparent distress HEENT:  Normocephalic/atraumatic, symmetric, EOMI, eyelids normal Neck:   No visible masses.  Respiratory:  Respirations unlabored, no use of accessory muscles CV:   Deferred Breast:  Deferred Musculoskeletal: Normal muscle strength. Abdomen:  Wounds are near completely healed. Nontender on exam.  No visible masses or protrusion Extremities:  No visible edema or deformities Skin:   Normal inspection Neuro/Psych:  No focal motor deficit, no abnormal mental status. Normal gait. Normal affect. Alert and oriented to person, place, and time      Oncologic Summary: 1. Pelvic mass complex in  nature in a postmenopausal patient   Assessment/Plan: Since pathology was benign she will be dispositioned back to Dr. Dellis Filbert and her primary care provider.  Jillian Caprice, MD  01/17/2018, 1:11 PM  Cc: Princess Bruins, MD Mackie Pai, PA-C

## 2018-01-17 ENCOUNTER — Encounter: Payer: Self-pay | Admitting: Obstetrics

## 2018-01-17 ENCOUNTER — Inpatient Hospital Stay: Payer: Medicare Other | Attending: Obstetrics | Admitting: Obstetrics

## 2018-01-17 VITALS — BP 130/66 | HR 65 | Temp 97.9°F | Resp 20 | Ht 66.0 in | Wt 165.2 lb

## 2018-01-17 DIAGNOSIS — D271 Benign neoplasm of left ovary: Secondary | ICD-10-CM | POA: Insufficient documentation

## 2018-01-17 DIAGNOSIS — Z90722 Acquired absence of ovaries, bilateral: Secondary | ICD-10-CM | POA: Insufficient documentation

## 2018-01-17 DIAGNOSIS — Z853 Personal history of malignant neoplasm of breast: Secondary | ICD-10-CM | POA: Insufficient documentation

## 2018-01-17 DIAGNOSIS — R1900 Intra-abdominal and pelvic swelling, mass and lump, unspecified site: Secondary | ICD-10-CM

## 2018-01-17 NOTE — Patient Instructions (Signed)
1. Call if any issues with your incision 2. Followup with your primary doctor and Dr. Dellis Filbert annually or as they recommend

## 2018-01-25 ENCOUNTER — Ambulatory Visit: Payer: Self-pay | Admitting: Medical

## 2018-01-25 ENCOUNTER — Emergency Department (HOSPITAL_BASED_OUTPATIENT_CLINIC_OR_DEPARTMENT_OTHER)
Admission: EM | Admit: 2018-01-25 | Discharge: 2018-01-25 | Disposition: A | Discharge status: Home / Self Care | Payer: Medicare Other | Attending: Physician Assistant | Admitting: Physician Assistant

## 2018-01-25 ENCOUNTER — Other Ambulatory Visit: Payer: Self-pay

## 2018-01-25 ENCOUNTER — Encounter (HOSPITAL_BASED_OUTPATIENT_CLINIC_OR_DEPARTMENT_OTHER): Payer: Self-pay

## 2018-01-25 DIAGNOSIS — W450XXA Nail entering through skin, initial encounter: Secondary | ICD-10-CM | POA: Insufficient documentation

## 2018-01-25 DIAGNOSIS — Y92009 Unspecified place in unspecified non-institutional (private) residence as the place of occurrence of the external cause: Secondary | ICD-10-CM | POA: Diagnosis not present

## 2018-01-25 DIAGNOSIS — Z9012 Acquired absence of left breast and nipple: Secondary | ICD-10-CM | POA: Insufficient documentation

## 2018-01-25 DIAGNOSIS — Z87891 Personal history of nicotine dependence: Secondary | ICD-10-CM | POA: Diagnosis not present

## 2018-01-25 DIAGNOSIS — S60812A Abrasion of left wrist, initial encounter: Secondary | ICD-10-CM

## 2018-01-25 DIAGNOSIS — Z23 Encounter for immunization: Secondary | ICD-10-CM | POA: Diagnosis not present

## 2018-01-25 DIAGNOSIS — I10 Essential (primary) hypertension: Secondary | ICD-10-CM | POA: Diagnosis not present

## 2018-01-25 DIAGNOSIS — Z79899 Other long term (current) drug therapy: Secondary | ICD-10-CM | POA: Insufficient documentation

## 2018-01-25 DIAGNOSIS — S6992XA Unspecified injury of left wrist, hand and finger(s), initial encounter: Secondary | ICD-10-CM | POA: Diagnosis present

## 2018-01-25 DIAGNOSIS — Z853 Personal history of malignant neoplasm of breast: Secondary | ICD-10-CM | POA: Diagnosis not present

## 2018-01-25 DIAGNOSIS — Y93E9 Activity, other interior property and clothing maintenance: Secondary | ICD-10-CM | POA: Diagnosis not present

## 2018-01-25 DIAGNOSIS — Y999 Unspecified external cause status: Secondary | ICD-10-CM | POA: Insufficient documentation

## 2018-01-25 MED ORDER — IBUPROFEN 800 MG PO TABS
800.0000 mg | ORAL_TABLET | Freq: Once | ORAL | Status: DC
  Filled 2018-01-25: qty 1

## 2018-01-25 MED ORDER — TETANUS-DIPHTH-ACELL PERTUSSIS 5-2.5-18.5 LF-MCG/0.5 IM SUSP
0.5000 mL | Freq: Once | INTRAMUSCULAR | Status: AC
Start: 2018-01-25 — End: 2018-01-25
  Administered 2018-01-25: 0.5 mL via INTRAMUSCULAR
  Filled 2018-01-25: qty 0.5

## 2018-01-25 NOTE — ED Provider Notes (Addendum)
[facility] EMERGENCY DEPARTMENT Provider Note  CSN: [id number] Arrival date & time: 01/25/18  1249  History   Chief Complaint Chief Complaint  Patient presents with  . Arm Injury    HPI Jillian Chandler is a 67 y.o. female with a medical history of breast cancer, HTN, HLD and gastric ulcer who presented to the ED following a nail puncture injury to her left wrist. Patient states that she was cleaning when she was stuck with a nail. She states that it blood began gushing from the site and she covered it with cotton and tape. Denies paresthesias, color changes in the hand or decreased ROM.   Past Medical History:  Diagnosis Date  . Breast cancer ([place]) 01/2009   left brest- status post mastectomy, intolerant to Arimidex, declined tamoxfien  . Gastric ulcer    CLO (-) per EGD ~ 11-2011  . Hyperlipidemia   . Hypertension 2006    Patient Active Problem List   Diagnosis Date Noted  . Left lateral epicondylitis 02/28/2017  . Left shoulder pain 02/28/2017  . Bilateral knee pain 02/25/2017  . Breast cancer, left breast ([place]) 10/02/2013  . Hyperlipidemia 07/26/2013  . Chest pain with moderate risk for cardiac etiology 07/06/2013  . Headache(784.0) 10/03/2012  . Menopause 02/17/2012  . Diabetes mellitus ([place]) 01/25/2012  . PUD (peptic ulcer disease) 01/25/2012  . Diverticulitis 07/20/2011  . General medical examination 06/18/2011  . Back pain 11/25/2010  . DEGENERATIVE JOINT DISEASE 01/21/2010  . Essential hypertension 08/12/2009  . BREAST CANCER, HX OF 08/12/2009    Past Surgical History:  Procedure Laterality Date  . COLONOSCOPY W/ POLYPECTOMY  11/2011  . MASTECTOMY Left 04/2009  . ROBOTIC ASSISTED BILATERAL SALPINGO OOPHERECTOMY Bilateral 12/09/2017   Procedure: XI ROBOTIC ASSISTED BILATERAL SALPINGO OOPHORECTOMY;  Surgeon: Isabel Caprice, MD;  Location: [facility];  Service: Gynecology;  Laterality: Bilateral;     OB History    Gravida  4   Para  3   Term  3   Preterm      AB  1   Living  3     SAB  1   TAB      Ectopic      Multiple      Live Births  3            Home Medications    Prior to Admission medications   Medication Sig Start Date End Date Taking? Authorizing Provider  acetaminophen (TYLENOL) 500 MG tablet Take 1 tablet (500 mg total) by mouth every 6 (six) hours as needed. 09/15/15   Antonietta Breach, PA-C  b complex vitamins capsule Take 1 capsule by mouth daily.    [provider]  Cholecalciferol (VITAMIN D3 PO) Take 3 tablets by mouth daily before breakfast.     [provider]  lisinopril-hydrochlorothiazide (PRINZIDE,ZESTORETIC) 20-25 MG tablet Take 1 tablet by mouth daily. Patient taking differently: Take 1 tablet by mouth daily before breakfast.  05/21/17   Saguier, Percell Miller, PA-C  metoprolol succinate (TOPROL-XL) 50 MG 24 hr tablet TAKE 1 TABLET BY MOUTH ONCE DAILY.TAKE WITH OR IMMEDIATELY FOLLOWING A MEAL 08/30/17   Saguier, Percell Miller, PA-C  Omega-3 Fatty Acids (OMEGA 3 PO) Take 2 capsules by mouth daily.    [provider]  pyridOXINE (VITAMIN B-6) 100 MG tablet Take 100 mg by mouth daily before breakfast.    [provider]  vitamin E 400 UNIT capsule Take 1,200 Units by mouth daily before breakfast.  [provider]    Family History Family History  Problem Relation Age of Onset  . Heart attack Father 40       F age 59, M age 62s  . Heart disease Father   . Diabetes Sister   . Diabetes Brother   . Diabetes Sister   . Heart disease Mother   . Diabetes Maternal Aunt   . Diabetes Maternal Uncle   . Diabetes Paternal Aunt   . Diabetes Paternal Uncle   . Diabetes Maternal Grandmother   . Diabetes Maternal Grandfather   . Diabetes Paternal Grandmother   . Diabetes Paternal Grandfather   . Stroke Neg Hx   . Colon cancer Neg Hx   . Breast cancer Neg Hx     Social History Social History   Tobacco Use  . Smoking status: Former Smoker    Last attempt to  quit: 02/17/2007    Years since quitting: 10.9  . Smokeless tobacco: Never Used  Substance Use Topics  . Alcohol use: No  . Drug use: No     Allergies   Patient has no known allergies.   Review of Systems Review of Systems  Constitutional: Negative.   Musculoskeletal: Negative.   Skin: Positive for wound. Negative for color change and pallor.  Allergic/Immunologic: Negative for immunocompromised state.  Neurological: Negative for dizziness, light-headedness and numbness.  Hematological: Negative.      Physical Exam Updated Vital Signs BP 120/72 (BP Location: Left Arm)   Pulse 80   Temp 98.3 F (36.8 C) (Oral)   Resp 16   Ht 5\' 6"  (1.676 m)   Wt 74.8 kg (165 lb)   SpO2 98%   BMI 26.63 kg/m   Physical Exam  Musculoskeletal: Normal range of motion. She exhibits no tenderness.       Arms: Neurological: She has normal strength. No sensory deficit.  Skin: Skin is warm and intact. Bruising noted.  ~1cm superficial scratch on anterior medial aspect of wrist about 4cm below ulnar process with light blue ecchymosis around it. Not actively bleeding and skin is intact.  Nursing note and vitals reviewed.       ED Treatments / Results  Labs (all labs ordered are listed, but only abnormal results are displayed) Labs Reviewed - No data to display  EKG None  Radiology No results found.  Procedures Procedures (including critical care time)  Medications Ordered in ED Medications  ibuprofen (ADVIL,MOTRIN) tablet 800 mg (800 mg Oral Not Given 01/25/18 1334)  Tdap (BOOSTRIX) injection 0.5 mL (0.5 mLs Intramuscular Given 01/25/18 1335)     Initial Impression / Assessment and Plan / ED Course  Triage vital signs and the nursing notes have been reviewed.  Pertinent labs & imaging results that were available during care of the patient were reviewed and considered in medical decision making (see chart for details).   Patient presents with reported penetrating wound.  However, on physical exam wound is superficial and barely breaks the skin. Neurovascular function of left upper extremity and hand is still intact. Area does not require laceration repair and antibiotics is not indicated. Tetanus booster given in the ED.  Final Clinical Impressions(s) / ED Diagnoses  1. Left Wrist Abrasion. Education provided on proper cleaning and OTC/supportive treatments.  Dispo: Home. After thorough clinical evaluation, this patient is determined to be medically stable and can be safely discharged with the previously mentioned treatment and/or outpatient follow-up/referral(s). At this time, there are no other apparent medical conditions that require  further screening, evaluation or treatment.  Final diagnoses:  Abrasion of left wrist, initial encounter    ED Discharge Orders    None        Romie Jumper, PA-C 01/25/18 1343    [address], [place], PA-C 01/25/18 1344    Mackuen, Fredia Sorrow, MD 01/25/18 1443

## 2018-01-25 NOTE — Telephone Encounter (Signed)
Spoke with patients husband who gave permission to talk to son  Patient has language barrier. Son states he is on the way to take her to [facility] . Pt was alone several hours ago and sustianed a puncture wound from a nail to her left arm. She apparently bled a lot according to son until she could apply direct pressure . The arm is swollen according to son and pt has pain in the affected arm.She has a history of Breast surgery L side. Son advised to continue with direct pressure to affected area and continue on to the er.  Reason for Disposition . [1] Raised bruise AND [2] size > 2 inches (5 cm) AND [3] expanding  Answer Assessment - Initial Assessment Questions 1. APPEARANCE of INJURY: "What does the injury look like?"      SWELLING OF L  ARM   2. SIZE: "How large is the cut?"      PUNCTURE  WOUND FROM NAIL  3. BLEEDING: "Is it bleeding now?" If so, ask: "Is it difficult to stop?"       NO BLEEDING NOW - ACCORDING TO PATIENT LOST A LOT OF BLOOD PRIOR TO PT  APPLYING PRESSURE  PRESSURE IN PLACE   4. LOCATION: "Where is the injury located?"        L  ARM    5. ONSET: "How long ago did the injury occur?"         2  HOURS AGO   6. MECHANISM: "Tell me how it happened."       HIT L  ARM ON A NAIL  IN CLOSET   7. TETANUS: "When was the last tetanus booster?"     2011  HISTORY  OF MASTECTOMY L  ARM   8. PREGNANCY: "Is there any chance you are pregnant?" "When was your last menstrual period?"      N/A  Protocols used: SKIN INJURY-A-AH

## 2018-01-25 NOTE — ED Triage Notes (Addendum)
Pt accidentally punctured left wrist area with a nail while cleaning-pt had cotton and painters tape in place-slight scratch noted-son with pt/interpreter states area "bled a lot"-NAD-steady gait

## 2018-02-25 ENCOUNTER — Other Ambulatory Visit: Payer: Self-pay | Admitting: Medical

## 2018-02-28 ENCOUNTER — Telehealth: Payer: Self-pay | Admitting: Medical

## 2018-02-28 NOTE — Telephone Encounter (Signed)
Copied from [place] [id number]. Topic: Quick Communication - Rx Refill/Question >> Feb 28, 2018  3:08 PM Keene Breath wrote: Medication: lisinopril-hydrochlorothiazide (PRINZIDE,ZESTORETIC) 20-25 MG tablet / metoprolol succinate (TOPROL-XL) 50 MG 24 hr tablet  Patient called to request refill for the above medications.  CB# [phone]  Preferred Pharmacy (with phone number or street name): [facility], [place] [phone] (Phone) [phone] (Fax)

## 2018-03-01 ENCOUNTER — Other Ambulatory Visit: Payer: Self-pay

## 2018-03-01 MED ORDER — METOPROLOL SUCCINATE ER 50 MG PO TB24: ORAL_TABLET | ORAL | 1 refills | Status: DC

## 2018-03-01 MED ORDER — LISINOPRIL-HYDROCHLOROTHIAZIDE 20-25 MG PO TABS: 1.0000 | ORAL_TABLET | Freq: Every day | ORAL | 1 refills | Status: DC

## 2018-03-14 ENCOUNTER — Ambulatory Visit: Payer: Medicare Other | Admitting: Medical

## 2018-03-17 ENCOUNTER — Ambulatory Visit: Payer: Medicare Other | Admitting: Medical

## 2018-03-18 ENCOUNTER — Encounter: Payer: Self-pay | Admitting: Medical

## 2018-03-18 ENCOUNTER — Ambulatory Visit (INDEPENDENT_AMBULATORY_CARE_PROVIDER_SITE_OTHER): Payer: Medicare Other | Admitting: Medical

## 2018-03-18 VITALS — BP 150/80 | HR 70 | Temp 97.6°F | Resp 16 | Ht 66.0 in | Wt 167.6 lb

## 2018-03-18 DIAGNOSIS — Z1211 Encounter for screening for malignant neoplasm of colon: Secondary | ICD-10-CM | POA: Diagnosis not present

## 2018-03-18 NOTE — Patient Instructions (Addendum)
To refer you to Gi for colonoscopy we do need your prior report as this will be a major barrier to referral. GI wants to review report before doing study. Also prior report will determine if diagnostic or screening.  Please drop of report as soon as possible or have that office send [place] report.  Follow up 2-3 months or as needed(recommend follow up at that time so you can get flu vaccine)

## 2018-03-18 NOTE — Progress Notes (Signed)
Subjective:    Patient ID: Jillian Chandler, female    DOB: [birth year], 67 y.o.   MRN: [id number]  HPI  Pt in for follow up.   Pt states more than 8 years. Pt not sure who did the procedure. Pt not aware of result. In 2012 appears Dr. Larose Kells tried to refer her but pt thought cost was too expensive.  No fever, no chills, no abdomen pain. No black stools, or bloody stools.  No  Diarrhea or constipation.  Pt just wants test done since she states she thinks needs to get done. Not aware if had abnormality on study.    Review of Systems  Constitutional: Negative for chills, fatigue and fever.  Respiratory: Negative for chest tightness, shortness of breath and wheezing.   Cardiovascular: Negative for chest pain and palpitations.  Gastrointestinal: Negative for abdominal pain.  Musculoskeletal: Negative for back pain, myalgias and neck stiffness.  Skin: Negative for rash.  Neurological: Negative for dizziness, weakness, numbness and headaches.  Hematological: Negative for adenopathy. Does not bruise/bleed easily.  Psychiatric/Behavioral: Negative for behavioral problems, confusion and sleep disturbance. The patient is not nervous/anxious.    Past Medical History:  Diagnosis Date  . Breast cancer ([place]) 01/2009   left brest- status post mastectomy, intolerant to Arimidex, declined tamoxfien  . Gastric ulcer    CLO (-) per EGD ~ 11-2011  . Hyperlipidemia   . Hypertension 2006     Social History   Socioeconomic History  . Marital status: Married    Spouse name: Not on file  . Number of children: 4  . Years of education: Not on file  . Highest education level: Not on file  Occupational History  . 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 Financial resource strain: Not on file  . Food insecurity:    Worry: Not on file    Inability: Not on file  . Transportation needs:    Medical: Not on file    Non-medical: Not on file  Tobacco Use  . Smoking  status: Former Smoker    Last attempt to quit: 02/17/2007    Years since quitting: 11.0  . Smokeless tobacco: Never Used  Substance and Sexual Activity  . Alcohol use: No  . Drug use: No  . Sexual activity: Not on file  Lifestyle  . Physical activity:    Days per week: Not on file    Minutes per session: Not on file  . Stress: Not on file  Relationships  . Social connections:    Talks on phone: Not on file    Gets together: Not on file    Attends religious service: Not on file    Active member of club or organization: Not on file    Attends meetings of clubs or organizations: Not on file    Relationship status: Not on file  . Intimate partner violence:    Fear of current or ex partner: Not on file    Emotionally abused: Not on file    Physically abused: Not on file    Forced sexual activity: Not on file  Other Topics Concern  . Not on file  Social History Narrative   Original from [place] ---   Diet: very  healthy   Exercise- not frequently but active at work                Past Surgical History:  Procedure Laterality Date  . COLONOSCOPY W/ POLYPECTOMY  11/2011  . MASTECTOMY Left 04/2009  . ROBOTIC ASSISTED BILATERAL SALPINGO OOPHERECTOMY Bilateral 12/09/2017   Procedure: XI ROBOTIC ASSISTED BILATERAL SALPINGO OOPHORECTOMY;  Surgeon: Isabel Caprice, MD;  Location: [facility];  Service: Gynecology;  Laterality: Bilateral;    Family History  Problem Relation Age of Onset  . Heart attack Father 9       F age 10, M age 11s  . Heart disease Father   . Diabetes Sister   . Diabetes Brother   . Diabetes Sister   . Heart disease Mother   . Diabetes Maternal Aunt   . Diabetes Maternal Uncle   . Diabetes Paternal Aunt   . Diabetes Paternal Uncle   . Diabetes Maternal Grandmother   . Diabetes Maternal Grandfather   . Diabetes Paternal Grandmother   . Diabetes Paternal Grandfather   . Stroke Neg Hx   . Colon cancer Neg Hx   . Breast cancer Neg Hx     No Known  Allergies  Current Outpatient Medications on File Prior to Visit  Medication Sig Dispense Refill  . acetaminophen (TYLENOL) 500 MG tablet Take 1 tablet (500 mg total) by mouth every 6 (six) hours as needed. 30 tablet 0  . b complex vitamins capsule Take 1 capsule by mouth daily.    . Cholecalciferol (VITAMIN D3 PO) Take 3 tablets by mouth daily before breakfast.     . lisinopril-hydrochlorothiazide (PRINZIDE,ZESTORETIC) 20-25 MG tablet Take 1 tablet by mouth daily. 90 tablet 1  . metoprolol succinate (TOPROL-XL) 50 MG 24 hr tablet TAKE 1 TABLET BY MOUTH ONCE DAILY.TAKE WITH OR IMMEDIATELY FOLLOWING A MEAL 90 tablet 1  . Omega-3 Fatty Acids (OMEGA 3 PO) Take 2 capsules by mouth daily.    Marland Kitchen pyridOXINE (VITAMIN B-6) 100 MG tablet Take 100 mg by mouth daily before breakfast.    . vitamin E 400 UNIT capsule Take 1,200 Units by mouth daily before breakfast.      No current facility-administered medications on file prior to visit.     BP (!) 150/80 (BP Location: Right Arm, Patient Position: Sitting, Cuff Size: Small)   Pulse 70   Temp 97.6 F (36.4 C) (Oral)   Resp 16   Ht 5\' 6"  (1.676 m)   Wt 167 lb 9.6 oz (76 kg)   SpO2 99%   BMI 27.05 kg/m       Objective:   Physical Exam  General- No acute distress. Pleasant patient. Lungs- Clear, even and unlabored. Heart- regular rate and rhythm. Neurologic- CNII- XII grossly intact. Abd- soft, nt, nd, +bs. No rebound or guarding.      Assessment & Plan:  To refer you to Gi for colonoscopy we do need your prior report as this will be a major barrier to referral. GI wants to review report before doing study. Also prior report will determine if diagnostic or screening.  Please drop of report as soon as possible or have that office send [place] report.  Follow up 2-3 months or as needed(recommend follow up at that time so you can get flu vaccine)  Mackie Pai, PA-C

## 2018-03-22 ENCOUNTER — Encounter: Payer: Self-pay | Admitting: Medical

## 2018-03-22 DIAGNOSIS — N6312 Unspecified lump in the right breast, upper inner quadrant: Secondary | ICD-10-CM | POA: Diagnosis not present

## 2018-04-26 ENCOUNTER — Other Ambulatory Visit: Payer: Self-pay | Admitting: Medical

## 2018-04-26 NOTE — Telephone Encounter (Signed)
Pt requesting refill on mobic do not see medication on med list.

## 2018-04-26 NOTE — Telephone Encounter (Signed)
I did refill the meloxicam. In past I wrote it for her knee pain. Would you call her and verify what is area of pain presently?

## 2018-04-28 NOTE — Telephone Encounter (Signed)
Patient reports pain on both knees and left heal.

## 2018-04-29 DIAGNOSIS — H2513 Age-related nuclear cataract, bilateral: Secondary | ICD-10-CM | POA: Diagnosis not present

## 2018-06-02 ENCOUNTER — Ambulatory Visit: Payer: Medicare Other | Admitting: Medical

## 2018-06-02 DIAGNOSIS — Z0289 Encounter for other administrative examinations: Secondary | ICD-10-CM

## 2018-06-06 ENCOUNTER — Ambulatory Visit (INDEPENDENT_AMBULATORY_CARE_PROVIDER_SITE_OTHER): Payer: Medicare Other | Admitting: Medical

## 2018-06-06 ENCOUNTER — Encounter: Payer: Self-pay | Admitting: Medical

## 2018-06-06 VITALS — BP 134/72 | HR 68 | Temp 97.9°F | Resp 16 | Ht 66.0 in | Wt 170.0 lb

## 2018-06-06 DIAGNOSIS — M25551 Pain in right hip: Secondary | ICD-10-CM | POA: Diagnosis not present

## 2018-06-06 DIAGNOSIS — M25561 Pain in right knee: Secondary | ICD-10-CM | POA: Diagnosis not present

## 2018-06-06 MED ORDER — METOPROLOL SUCCINATE ER 50 MG PO TB24: ORAL_TABLET | ORAL | 1 refills | Status: DC

## 2018-06-06 MED ORDER — LISINOPRIL-HYDROCHLOROTHIAZIDE 20-25 MG PO TABS: 1.0000 | ORAL_TABLET | Freq: Every day | ORAL | 1 refills | Status: DC

## 2018-06-06 NOTE — Patient Instructions (Addendum)
Your rt hip and knee region pain do appear to get some better with meloxicam but your pain is persisting after 3 weeks. Xrays don't show severe arthritic changes so would like to get sport medicine opinion on evaluation and treatment.(possible component of trochanteric bursitis)  Continue meloxicam and hope to get you in with sports medicine within one week.  Follow up as needed

## 2018-06-06 NOTE — Progress Notes (Signed)
Subjective:    Patient ID: Jillian Chandler, female    DOB: 10/27/1950, 67 y.o.   MRN: [id number]  HPI Pt in with some rt side hip pain that radiates down her rt leg toward her knee(3weeks pain). No fall or trauma.  Pt states pain does improve with meloxicam. But pain moderate to severe if she does not take. She is only taking meloxicam every 3-4 days.  Pt has know degenerative changes but not severe in rt hip. IMPRESSION: Degenerative changes lumbar spine and both hips. No acute Abnormality.  Pt rt knee xray showed. IMPRESSION:  1. No fracture, bone lesion or acute finding. 2. Arthropathic changes most consistent with osteoarthritis, similar to that seen on the left.     Review of Systems  Constitutional: Negative for chills and fever.  HENT: Negative for congestion, drooling and ear pain.   Cardiovascular: Negative for chest pain and palpitations.  Gastrointestinal: Negative for abdominal distention, abdominal pain and constipation.  Musculoskeletal: Negative for back pain, neck pain and neck stiffness.       Hip and knee pain.  Skin: Negative for rash.  Neurological: Negative for dizziness, speech difficulty, weakness, light-headedness and headaches.  Psychiatric/Behavioral: Negative for behavioral problems, confusion and sleep disturbance. The patient is not nervous/anxious.     Past Medical History:  Diagnosis Date  . Breast cancer ([place]) 01/2009   left brest- status post mastectomy, intolerant to Arimidex, declined tamoxfien  . Gastric ulcer    CLO (-) per EGD ~ 11-2011  . Hyperlipidemia   . Hypertension 2006     Social History   Socioeconomic History  . Marital status: Married    Spouse name: Not on file  . Number of children: 4  . Years of education: Not on file  . Highest education level: Not on file  Occupational History  . 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 Financial resource strain: Not on file  . Food insecurity:     Worry: Not on file    Inability: Not on file  . Transportation needs:    Medical: Not on file    Non-medical: Not on file  Tobacco Use  . Smoking status: Former Smoker    Last attempt to quit: 02/17/2007    Years since quitting: 11.3  . Smokeless tobacco: Never Used  Substance and Sexual Activity  . Alcohol use: No  . Drug use: No  . Sexual activity: Not on file  Lifestyle  . Physical activity:    Days per week: Not on file    Minutes per session: Not on file  . Stress: Not on file  Relationships  . Social connections:    Talks on phone: Not on file    Gets together: Not on file    Attends religious service: Not on file    Active member of club or organization: Not on file    Attends meetings of clubs or organizations: Not on file    Relationship status: Not on file  . Intimate partner violence:    Fear of current or ex partner: Not on file    Emotionally abused: Not on file    Physically abused: Not on file    Forced sexual activity: Not on file  Other Topics Concern  . Not on file  Social History Narrative   Original from [place] ---   Diet: very  healthy   Exercise- not frequently but active at work  Past Surgical History:  Procedure Laterality Date  . COLONOSCOPY W/ POLYPECTOMY  11/2011  . MASTECTOMY Left 04/2009  . ROBOTIC ASSISTED BILATERAL SALPINGO OOPHERECTOMY Bilateral 12/09/2017   Procedure: XI ROBOTIC ASSISTED BILATERAL SALPINGO OOPHORECTOMY;  Surgeon: Isabel Caprice, MD;  Location: [facility];  Service: Gynecology;  Laterality: Bilateral;    Family History  Problem Relation Age of Onset  . Heart attack Father 39       F age 55, M age 74s  . Heart disease Father   . Diabetes Sister   . Diabetes Brother   . Diabetes Sister   . Heart disease Mother   . Diabetes Maternal Aunt   . Diabetes Maternal Uncle   . Diabetes Paternal Aunt   . Diabetes Paternal Uncle   . Diabetes Maternal Grandmother   . Diabetes Maternal Grandfather   .  Diabetes Paternal Grandmother   . Diabetes Paternal Grandfather   . Stroke Neg Hx   . Colon cancer Neg Hx   . Breast cancer Neg Hx     No Known Allergies  Current Outpatient Medications on File Prior to Visit  Medication Sig Dispense Refill  . acetaminophen (TYLENOL) 500 MG tablet Take 1 tablet (500 mg total) by mouth every 6 (six) hours as needed. 30 tablet 0  . b complex vitamins capsule Take 1 capsule by mouth daily.    . Cholecalciferol (VITAMIN D3 PO) Take 3 tablets by mouth daily before breakfast.     . meloxicam (MOBIC) 15 MG tablet TAKE 1 TABLET BY MOUTH ONCE DAILY 30 tablet 0  . Omega-3 Fatty Acids (OMEGA 3 PO) Take 2 capsules by mouth daily.    Marland Kitchen pyridOXINE (VITAMIN B-6) 100 MG tablet Take 100 mg by mouth daily before breakfast.    . vitamin E 400 UNIT capsule Take 1,200 Units by mouth daily before breakfast.      No current facility-administered medications on file prior to visit.     BP 134/72   Pulse 68   Temp 97.9 F (36.6 C) (Oral)   Resp 16   Ht 5\' 6"  (1.676 m)   Wt 170 lb (77.1 kg)   SpO2 100%   BMI 27.44 kg/m       Objective:   Physical Exam  General- No acute distress. Pleasant patient. Neck- Full range of motion, no jvd Lungs- Clear, even and unlabored. Heart- regular rate and rhythm. Neurologic- CNII- XII grossly intact.  Rt hip pain- some pain on range of motion. Trochanter area tender to palpation.  Rt knee- mild-moderate pain on range of motion. No crepitus.     Assessment & Plan:  Your rt hip and knee region pain do appear to get some better with meloxicam but your pain is persisting after 3 weeks. Xrays don't show severe arthritic changes so would like to get sport medicine opinion on evaluation and treatment.  Continue meloxicam and hope to get you in with sports medicine within one week.  Follow up as needed  Mackie Pai, PA-C

## 2018-07-19 ENCOUNTER — Other Ambulatory Visit: Payer: Self-pay | Admitting: Medical

## 2018-07-19 NOTE — Telephone Encounter (Signed)
Copied from [place] [id number]. Topic: Quick Communication - See Telephone Encounter >> Jul 19, 2018  8:35 AM Rosalin Hawking wrote: CRM for notification. See Telephone encounter for: 07/19/18.   Pt came in office stating is needing refill on meloxicam (MOBIC) 15 MG tablet, pt states is traveling out of town to [place] and will be out for three months (January, February and March). Pt is needing refill send to pharmacy. Please advise.

## 2018-07-21 MED ORDER — MELOXICAM 15 MG PO TABS: 15.0000 mg | ORAL_TABLET | Freq: Every day | ORAL | 0 refills | Status: DC

## 2018-07-21 NOTE — Telephone Encounter (Signed)
Rx sent 

## 2018-07-30 ENCOUNTER — Encounter (HOSPITAL_BASED_OUTPATIENT_CLINIC_OR_DEPARTMENT_OTHER): Payer: Self-pay | Admitting: *Deleted

## 2018-07-30 ENCOUNTER — Other Ambulatory Visit: Payer: Self-pay

## 2018-07-30 ENCOUNTER — Emergency Department (HOSPITAL_BASED_OUTPATIENT_CLINIC_OR_DEPARTMENT_OTHER)
Admission: EM | Admit: 2018-07-30 | Discharge: 2018-07-30 | Disposition: A | Discharge status: Home / Self Care | Payer: Medicare Other | Attending: Emergency Medicine | Admitting: Emergency Medicine

## 2018-07-30 DIAGNOSIS — E119 Type 2 diabetes mellitus without complications: Secondary | ICD-10-CM | POA: Diagnosis not present

## 2018-07-30 DIAGNOSIS — J069 Acute upper respiratory infection, unspecified: Secondary | ICD-10-CM | POA: Insufficient documentation

## 2018-07-30 DIAGNOSIS — Z87891 Personal history of nicotine dependence: Secondary | ICD-10-CM | POA: Insufficient documentation

## 2018-07-30 DIAGNOSIS — J029 Acute pharyngitis, unspecified: Secondary | ICD-10-CM | POA: Diagnosis not present

## 2018-07-30 DIAGNOSIS — Z79899 Other long term (current) drug therapy: Secondary | ICD-10-CM | POA: Insufficient documentation

## 2018-07-30 DIAGNOSIS — H9202 Otalgia, left ear: Secondary | ICD-10-CM | POA: Diagnosis not present

## 2018-07-30 DIAGNOSIS — Z853 Personal history of malignant neoplasm of breast: Secondary | ICD-10-CM | POA: Insufficient documentation

## 2018-07-30 DIAGNOSIS — I10 Essential (primary) hypertension: Secondary | ICD-10-CM | POA: Insufficient documentation

## 2018-07-30 LAB — GROUP A STREP BY PCR: GROUP A STREP BY PCR: NOT DETECTED

## 2018-07-30 MED ORDER — KETOROLAC TROMETHAMINE 15 MG/ML IJ SOLN
15.0000 mg | Freq: Once | INTRAMUSCULAR | Status: AC
  Administered 2018-07-30: 15 mg via INTRAMUSCULAR
  Filled 2018-07-30: qty 1

## 2018-07-30 MED ORDER — PHENOL 1.4 % MT LIQD: 1.0000 | OROMUCOSAL | 0 refills | Status: AC | PRN

## 2018-07-30 MED ORDER — FLUTICASONE PROPIONATE 50 MCG/ACT NA SUSP: 1.0000 | Freq: Every day | NASAL | 0 refills | Status: DC

## 2018-07-30 NOTE — Discharge Instructions (Signed)
You likely have a viral illness.  This should be treated symptomatically. Use Tylenol or ibuprofen as needed for fevers or body aches. Use Flonase daily for nasal congestion and cough. Use sore throat spray as needed. Make sure you stay well-hydrated with water. Wash your hands frequently to prevent spread of infection. Follow-up with your primary care doctor in 1 week if your symptoms are not improving. Return to the emergency room if you develop chest pain, difficulty breathing, or any new or worsening symptoms.

## 2018-07-30 NOTE — ED Triage Notes (Signed)
Left ear pain and throat pain x 1 day. Denies fever.

## 2018-07-30 NOTE — ED Notes (Signed)
Pt amb to [facility]

## 2018-07-30 NOTE — ED Provider Notes (Signed)
[facility] DEPARTMENT Provider Note   CSN: [id number] Arrival date & time: 07/30/18  1042     History   Chief Complaint Chief Complaint  Patient presents with  . Otalgia    HPI Jillian Chandler is a 67 y.o. female presenting for evaluation of sore throat and left ear pain.  Patient states that since last night, she has been having pain in her throat and left ear.  She reports chills without fevers.  She denies symptoms on the right side.  She denies dental pain.  She took Tylenol with improvement of symptoms, but has not tried anything else.  She states she is a sick family member at home with similar symptoms.  She associated nasal congestion and a nonproductive cough.  She has chest pain, shortness of breath, nausea, vomiting, domino pain, urinary symptoms, normal bowel movements.  States she has a history of high blood pressure, for which she takes medication, no other medical problems.  Denies tobacco use.  Denies history of asthma or COPD.  HPI  Past Medical History:  Diagnosis Date  . Breast cancer ([place]) 01/2009   left brest- status post mastectomy, intolerant to Arimidex, declined tamoxfien  . Gastric ulcer    CLO (-) per EGD ~ 11-2011  . Hyperlipidemia   . Hypertension 2006    Patient Active Problem List   Diagnosis Date Noted  . Left lateral epicondylitis 02/28/2017  . Left shoulder pain 02/28/2017  . Bilateral knee pain 02/25/2017  . Breast cancer, left breast ([place]) 10/02/2013  . Hyperlipidemia 07/26/2013  . Chest pain with moderate risk for cardiac etiology 07/06/2013  . Headache(784.0) 10/03/2012  . Menopause 02/17/2012  . Diabetes mellitus ([place]) 01/25/2012  . PUD (peptic ulcer disease) 01/25/2012  . Diverticulitis 07/20/2011  . General medical examination 06/18/2011  . Back pain 11/25/2010  . DEGENERATIVE JOINT DISEASE 01/21/2010  . Essential hypertension 08/12/2009  . BREAST CANCER, HX OF 08/12/2009    Past Surgical History:    Procedure Laterality Date  . COLONOSCOPY W/ POLYPECTOMY  11/2011  . MASTECTOMY Left 04/2009  . ROBOTIC ASSISTED BILATERAL SALPINGO OOPHERECTOMY Bilateral 12/09/2017   Procedure: XI ROBOTIC ASSISTED BILATERAL SALPINGO OOPHORECTOMY;  Surgeon: Isabel Caprice, MD;  Location: [facility];  Service: Gynecology;  Laterality: Bilateral;     OB History    Gravida  4   Para  3   Term  3   Preterm      AB  1   Living  3     SAB  1   TAB      Ectopic      Multiple      Live Births  3            Home Medications    Prior to Admission medications   Medication Sig Start Date End Date Taking? Authorizing Provider  acetaminophen (TYLENOL) 500 MG tablet Take 1 tablet (500 mg total) by mouth every 6 (six) hours as needed. 09/15/15   Antonietta Breach, PA-C  b complex vitamins capsule Take 1 capsule by mouth daily.    [provider]  Cholecalciferol (VITAMIN D3 PO) Take 3 tablets by mouth daily before breakfast.     [provider]  fluticasone (FLONASE) 50 MCG/ACT nasal spray Place 1 spray into both nostrils daily. 07/30/18   Jynesis Nakamura, PA-C  lisinopril-hydrochlorothiazide (PRINZIDE,ZESTORETIC) 20-25 MG tablet Take 1 tablet by mouth daily. 06/06/18   Saguier, Percell Miller, PA-C  meloxicam (MOBIC) 15 MG tablet Take 1  tablet (15 mg total) by mouth daily. 07/21/18   Saguier, Percell Miller, PA-C  metoprolol succinate (TOPROL-XL) 50 MG 24 hr tablet TAKE 1 TABLET BY MOUTH ONCE DAILY.TAKE WITH OR IMMEDIATELY FOLLOWING A MEAL 06/06/18   Saguier, Percell Miller, PA-C  Omega-3 Fatty Acids (OMEGA 3 PO) Take 2 capsules by mouth daily.    [provider]  phenol (CHLORASEPTIC) 1.4 % LIQD Use as directed 1 spray in the mouth or throat as needed for throat irritation / pain. 07/30/18   Earline Stiner, PA-C  pyridOXINE (VITAMIN B-6) 100 MG tablet Take 100 mg by mouth daily before breakfast.    [provider]  vitamin E 400 UNIT capsule Take 1,200 Units by mouth daily before breakfast.      [provider]    Family History Family History  Problem Relation Age of Onset  . Heart attack Father 84       F age 73, M age 63s  . Heart disease Father   . Diabetes Sister   . Diabetes Brother   . Diabetes Sister   . Heart disease Mother   . Diabetes Maternal Aunt   . Diabetes Maternal Uncle   . Diabetes Paternal Aunt   . Diabetes Paternal Uncle   . Diabetes Maternal Grandmother   . Diabetes Maternal Grandfather   . Diabetes Paternal Grandmother   . Diabetes Paternal Grandfather   . Stroke Neg Hx   . Colon cancer Neg Hx   . Breast cancer Neg Hx     Social History Social History   Tobacco Use  . Smoking status: Former Smoker    Last attempt to quit: 02/17/2007    Years since quitting: 11.4  . Smokeless tobacco: Never Used  Substance Use Topics  . Alcohol use: No  . Drug use: No     Allergies   Patient has no known allergies.   Review of Systems Review of Systems  HENT: Positive for ear pain and sore throat.      Physical Exam Updated Vital Signs BP (!) 126/59 (BP Location: Right Arm)   Pulse 79   Temp 98.3 F (36.8 C) (Oral)   Resp 17   Ht 5\' 8"  (1.727 m)   Wt 74.8 kg   SpO2 99%   BMI 25.09 kg/m   Physical Exam Vitals signs and nursing note reviewed.  Constitutional:      General: She is not in acute distress.    Appearance: She is well-developed.     Comments: sitting in the bed in no acute distress  HENT:     Head: Normocephalic and atraumatic.     Comments: OP clear without tonsillar swelling easily.  Uvula midline with ago palate rise.  TMs nonerythematous nonbulging bilaterally.  Tenderness palpation of the left maxillary and frontal sinuses.  Handling secretions easily.  No trismus. No muffled voice    Right Ear: Tympanic membrane, ear canal and external ear normal.     Left Ear: Tympanic membrane, ear canal and external ear normal.     Nose: Mucosal edema and congestion present.     Right Sinus: No maxillary sinus  tenderness or frontal sinus tenderness.     Left Sinus: Maxillary sinus tenderness and frontal sinus tenderness present.     Mouth/Throat:     Pharynx: Uvula midline.     Tonsils: No tonsillar exudate.  Eyes:     Conjunctiva/sclera: Conjunctivae normal.     Pupils: Pupils are equal, round, and reactive to light.  Neck:  Musculoskeletal: Normal range of motion.  Cardiovascular:     Rate and Rhythm: Normal rate and regular rhythm.     Pulses: Normal pulses.  Pulmonary:     Effort: Pulmonary effort is normal.     Breath sounds: Normal breath sounds. No decreased breath sounds, wheezing, rhonchi or rales.  Abdominal:     General: There is no distension.     Palpations: Abdomen is soft.     Tenderness: There is no abdominal tenderness.  Musculoskeletal: Normal range of motion.  Lymphadenopathy:     Cervical: Cervical adenopathy (on the L side) present.  Skin:    General: Skin is warm.  Neurological:     Mental Status: She is alert and oriented to person, place, and time.      ED Treatments / Results  Labs (all labs ordered are listed, but only abnormal results are displayed) Labs Reviewed  GROUP A STREP BY PCR    EKG None  Radiology No results found.  Procedures Procedures (including critical care time)  Medications Ordered in ED Medications  ketorolac (TORADOL) 15 MG/ML injection 15 mg (15 mg Intramuscular Given 07/30/18 1421)     Initial Impression / Assessment and Plan / ED Course  I have reviewed the triage vital signs and the nursing notes.  Pertinent labs & imaging results that were available during my care of the patient were reviewed by me and considered in my medical decision making (see chart for details).     Patient presenting with 1 day h/o URI symptoms.  Physical exam reassuring, patient is afebrile and appears nontoxic.  Pulmonary exam reassuring.  Doubt pneumonia, strep, other bacterial infection, or peritonsillar abscess. Strep test negative.   Likely viral URI.  Will treat symptomatically.  Patient to follow-up with primary care as needed.  At this time, patient appears safe for discharge.  Return precautions given.  Patient states she understands and agrees to plan.  Final Clinical Impressions(s) / ED Diagnoses   Final diagnoses:  Left ear pain  Sore throat  Upper respiratory tract infection, unspecified type    ED Discharge Orders         Ordered    fluticasone (FLONASE) 50 MCG/ACT nasal spray  Daily     07/30/18 1416    phenol (CHLORASEPTIC) 1.4 % LIQD  As needed     07/30/18 1416           Teja Judice, PA-C 07/30/18 1548    Maudie Flakes, MD 07/31/18 [phone]

## 2018-09-03 ENCOUNTER — Other Ambulatory Visit: Payer: Self-pay | Admitting: Medical

## 2018-09-03 DIAGNOSIS — E785 Hyperlipidemia, unspecified: Secondary | ICD-10-CM

## 2019-03-17 ENCOUNTER — Encounter: Payer: Self-pay | Admitting: Medical

## 2019-03-17 ENCOUNTER — Ambulatory Visit (INDEPENDENT_AMBULATORY_CARE_PROVIDER_SITE_OTHER): Payer: Medicare Other | Admitting: Medical

## 2019-03-17 ENCOUNTER — Other Ambulatory Visit: Payer: Self-pay | Admitting: Medical

## 2019-03-17 VITALS — BP 120/85

## 2019-03-17 DIAGNOSIS — G4700 Insomnia, unspecified: Secondary | ICD-10-CM

## 2019-03-17 DIAGNOSIS — F419 Anxiety disorder, unspecified: Secondary | ICD-10-CM

## 2019-03-17 MED ORDER — METOPROLOL SUCCINATE ER 50 MG PO TB24: ORAL_TABLET | ORAL | 1 refills | Status: DC

## 2019-03-17 MED ORDER — DIAZEPAM 5 MG PO TABS: ORAL_TABLET | ORAL | 0 refills | Status: AC

## 2019-03-17 NOTE — Patient Instructions (Signed)
Patient describes recent insomnia and sleep disturbance lately likely anxiety related due to the pandemic.  She borrowed medication from 1 of her friends recently and she states that helps a lot.  Unclear what medication she borrowed but it sounds like it benzodiazepine.  However the dose is too high.  I decided to prescribe her Valium 5 mg to take 1 tablet at night over the next 7 to 10 days.  Explained clearly not to use my prescription along with the medication she brought from her friend.  Patient expressed understanding.  We will see how she responds to treatment and will decide whether to continue Valium or maybe offer other medication on follow-up.  Follow-up in 7 days or as needed.

## 2019-03-17 NOTE — Progress Notes (Signed)
   Subjective:    Patient ID: Jillian Chandler, female    DOB: 22-Nov-1950, 68 y.o.   MRN: [id number]  HPI  Virtual Visit via Telephone Note  I connected with Jillian Chandler on 03/17/19 at 11:20 AM EDT by telephone and verified that I am speaking with the correct person using two identifiers.  Location: Patient: home Provider: office.   I discussed the limitations, risks, security and privacy concerns of performing an evaluation and management service by telephone and the availability of in person appointments. I also discussed with the patient that there may be a patient responsible charge related to this service. The patient expressed understanding and agreed to proceed.  Pt did not check her vital signs.   History of Present Illness: Pt states she has not been sleeping over last weeks(tried some over the counter medications but did not help). Hx of insomnia  in past but recent anxiety about pandemic has worsened. Pt borrowed 8 tabs from friend Pt states 100 mg tab helped. But tell me in spanish name of med which sound like diazepam but does too high for diazepam.  Pt states need refill of metoprolol.    Observations/Objective: General- pleasant, oriented, no distress, normal speech.   Assessment and Plan: Patient describes recent insomnia and sleep disturbance lately likely anxiety related due to the pandemic.  She borrowed medication from 1 of her friends recently and she states that helps a lot.  Unclear what medication she borrowed but it sounds like it benzodiazepine.  However the dose is too high.  I decided to prescribe her Valium 5 mg to take 1 tablet at night over the next 7 to 10 days.  Explained clearly not to use my prescription along with the medication she brought from her friend.  Patient expressed understanding.  We will see how she responds to treatment and will decide whether to continue Valium or maybe offer other medication on follow-up.  Follow-up in 7 days or as  needed.  Mackie Pai, PA-C  Follow Up Instructions:    I discussed the assessment and treatment plan with the patient. The patient was provided an opportunity to ask questions and all were answered. The patient agreed with the plan and demonstrated an understanding of the instructions.   The patient was advised to call back or seek an in-person evaluation if the symptoms worsen or if the condition fails to improve as anticipated.  I provided  15 minutes of non-face-to-face time during this encounter.   Mackie Pai, PA-C   Review of Systems  Constitutional: Negative for chills.  Respiratory: Negative for chest tightness, shortness of breath and wheezing.   Cardiovascular: Negative for chest pain and palpitations.  Gastrointestinal: Negative for abdominal pain.  Musculoskeletal: Negative for back pain and neck pain.  Skin: Negative for rash.  Psychiatric/Behavioral: Positive for sleep disturbance. Negative for behavioral problems, dysphoric mood and suicidal ideas. The patient is nervous/anxious.     Mackie Pai, PA-C     Objective:   Physical Exam        Assessment & Plan:

## 2019-03-22 ENCOUNTER — Encounter: Payer: Self-pay | Admitting: Medical

## 2019-03-22 ENCOUNTER — Telehealth: Payer: Self-pay | Admitting: Medical

## 2019-03-22 ENCOUNTER — Other Ambulatory Visit: Payer: Self-pay

## 2019-03-22 ENCOUNTER — Ambulatory Visit (INDEPENDENT_AMBULATORY_CARE_PROVIDER_SITE_OTHER): Payer: Medicare Other | Admitting: Medical

## 2019-03-22 VITALS — BP 145/72 | HR 67 | Temp 97.5°F | Resp 16 | Wt 171.0 lb

## 2019-03-22 DIAGNOSIS — F419 Anxiety disorder, unspecified: Secondary | ICD-10-CM

## 2019-03-22 DIAGNOSIS — F39 Unspecified mood [affective] disorder: Secondary | ICD-10-CM | POA: Diagnosis not present

## 2019-03-22 DIAGNOSIS — G4700 Insomnia, unspecified: Secondary | ICD-10-CM | POA: Diagnosis not present

## 2019-03-22 MED ORDER — QUETIAPINE FUMARATE 50 MG PO TABS: 50.0000 mg | ORAL_TABLET | Freq: Every day | ORAL | 0 refills | Status: DC

## 2019-03-22 NOTE — Patient Instructions (Signed)
Recent insomnia with history of mood disorder worsen by pandemic and inability to visit her family in [place].  You did not respond well to Valium but tried Seroquel 100 mg dose which you borrowed from your friend.  Since you did have good response I think we can continue this medication but recommend lower dose to see if you get benefit from medication but  will minimize potential side effects.  If 50 mg dose is not adequate then will increase back to 100 mg.  Note side effect that can happen with medication is cardiac so best to use lowest dose possible.  Prior EKG in past did not show QT prolongation.  Follow-up in 2 weeks or as needed.

## 2019-03-22 NOTE — Progress Notes (Signed)
Subjective:    Patient ID: Jillian Chandler, female    DOB: [birth year], 68 y.o.   MRN: [id number]  HPI  Pt in for follow up on insomnia. Pt states can't sleep due to severe anxiety. Pt family is in [place]. She can't travel to visit family.  Pt in past had been on seroquel in past for mood disorder x 3 years(no known pychosis hx or bipolar).. She used this again recently and she states helped her mood and sleep. Prior ekg last year no qt prolongation.  I had written valium 5 mg to use at night. She states 5 mg and 10 mg did not help.     Review of Systems  Constitutional: Negative for activity change, chills, fatigue and fever.  Respiratory: Negative for cough, chest tightness, shortness of breath and wheezing.   Cardiovascular: Negative for chest pain and palpitations.  Gastrointestinal: Negative for abdominal pain, constipation and diarrhea.  Genitourinary: Negative for dysuria and flank pain.  Musculoskeletal: Negative for back pain.  Skin: Negative for rash.  Neurological: Negative for dizziness, seizures, syncope, weakness and light-headedness.  Hematological: Negative for adenopathy. Does not bruise/bleed easily.  Psychiatric/Behavioral: Positive for decreased concentration. Negative for behavioral problems, hallucinations and suicidal ideas. The patient is nervous/anxious.     Past Medical History:  Diagnosis Date  . Breast cancer ([place]) 01/2009   left brest- status post mastectomy, intolerant to Arimidex, declined tamoxfien  . Gastric ulcer    CLO (-) per EGD ~ 11-2011  . Hyperlipidemia   . Hypertension 2006     Social History   Socioeconomic History  . Marital status: Married    Spouse name: Not on file  . Number of children: 4  . Years of education: Not on file  . Highest education level: Not on file  Occupational History  . 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 Financial resource strain: Not on file  . Food insecurity   Worry: Not on file    Inability: Not on file  . Transportation needs    Medical: Not on file    Non-medical: Not on file  Tobacco Use  . Smoking status: Former Smoker    Quit date: 02/17/2007    Years since quitting: 12.0  . Smokeless tobacco: Never Used  Substance and Sexual Activity  . Alcohol use: No  . Drug use: No  . Sexual activity: Not on file  Lifestyle  . Physical activity    Days per week: Not on file    Minutes per session: Not on file  . Stress: Not on file  Relationships  . Social Herbalist on phone: Not on file    Gets together: Not on file    Attends religious service: Not on file    Active member of club or organization: Not on file    Attends meetings of clubs or organizations: Not on file    Relationship status: Not on file  . Intimate partner violence    Fear of current or ex partner: Not on file    Emotionally abused: Not on file    Physically abused: Not on file    Forced sexual activity: Not on file  Other Topics Concern  . Not on file  Social History Narrative   Original from [place] ---   Diet: very  healthy   Exercise- not frequently but active at work  Past Surgical History:  Procedure Laterality Date  . COLONOSCOPY W/ POLYPECTOMY  11/2011  . MASTECTOMY Left 04/2009  . ROBOTIC ASSISTED BILATERAL SALPINGO OOPHERECTOMY Bilateral 12/09/2017   Procedure: XI ROBOTIC ASSISTED BILATERAL SALPINGO OOPHORECTOMY;  Surgeon: Isabel Caprice, MD;  Location: [facility];  Service: Gynecology;  Laterality: Bilateral;    Family History  Problem Relation Age of Onset  . Heart attack Father 90       F age 47, M age 76s  . Heart disease Father   . Diabetes Sister   . Diabetes Brother   . Diabetes Sister   . Heart disease Mother   . Diabetes Maternal Aunt   . Diabetes Maternal Uncle   . Diabetes Paternal Aunt   . Diabetes Paternal Uncle   . Diabetes Maternal Grandmother   . Diabetes Maternal Grandfather   . Diabetes Paternal  Grandmother   . Diabetes Paternal Grandfather   . Stroke Neg Hx   . Colon cancer Neg Hx   . Breast cancer Neg Hx     No Known Allergies  Current Outpatient Medications on File Prior to Visit  Medication Sig Dispense Refill  . acetaminophen (TYLENOL) 500 MG tablet Take 1 tablet (500 mg total) by mouth every 6 (six) hours as needed. 30 tablet 0  . atorvastatin (LIPITOR) 40 MG tablet TAKE 1 TABLET BY MOUTH ONCE DAILY 90 tablet 0  . b complex vitamins capsule Take 1 capsule by mouth daily.    . Cholecalciferol (VITAMIN D3 PO) Take 3 tablets by mouth daily before breakfast.     . diazepam (VALIUM) 5 MG tablet 1 tab po q hs prn insomnia or anxiety 10 tablet 0  . fluticasone (FLONASE) 50 MCG/ACT nasal spray Place 1 spray into both nostrils daily. 16 g 0  . lisinopril-hydrochlorothiazide (ZESTORETIC) 20-25 MG tablet Take 1 tablet by mouth once daily 90 tablet 0  . meloxicam (MOBIC) 15 MG tablet Take 1 tablet by mouth once daily 90 tablet 0  . metoprolol succinate (TOPROL-XL) 50 MG 24 hr tablet TAKE 1 TABLET BY MOUTH ONCE DAILY.TAKE WITH OR IMMEDIATELY FOLLOWING A MEAL 90 tablet 1  . Omega-3 Fatty Acids (OMEGA 3 PO) Take 2 capsules by mouth daily.    . phenol (CHLORASEPTIC) 1.4 % LIQD Use as directed 1 spray in the mouth or throat as needed for throat irritation / pain. 177 mL 0  . pyridOXINE (VITAMIN B-6) 100 MG tablet Take 100 mg by mouth daily before breakfast.    . vitamin E 400 UNIT capsule Take 1,200 Units by mouth daily before breakfast.      No current facility-administered medications on file prior to visit.     BP (!) 145/72 (BP Location: Right Arm, Patient Position: Sitting, Cuff Size: Small)   Pulse 67   Temp (!) 97.5 F (36.4 C) (Temporal)   Resp 16   Wt 171 lb (77.6 kg)   SpO2 99%   BMI 26.00 kg/m       Objective:   Physical Exam  General Mental Status- Alert. General Appearance- Not in acute distress.   Skin General: Color- Normal Color. Moisture- Normal Moisture.   Neck Carotid Arteries- Normal color. Moisture- Normal Moisture. No carotid bruits. No JVD.  Chest and Lung Exam Auscultation: Breath Sounds:-Normal.  Cardiovascular Auscultation:Rythm- Regular. Murmurs & Other Heart Sounds:Auscultation of the heart reveals- No Murmurs.  Abdomen Inspection:-Inspeection Normal. Palpation/Percussion:Note:No mass. Palpation and Percussion of the abdomen reveal- Non Tender, Non Distended + BS, no rebound or guarding.  Neurologic Cranial Nerve exam:- CN III-XII intact(No nystagmus), symmetric smile. Strength:- 5/5 equal and symmetric strength both upper and lower extremities.      Assessment & Plan:  Recent insomnia with history of mood disorder worsen by pandemic and inability to visit her family in [place].  You did not respond well to Valium but tried Seroquel 100 mg dose which you borrowed from your friend.  Since you did have good response I think we can continue this medication but recommend lower dose to see if you get benefit from medication but  will minimize potential side effects.  If 50 mg dose is not adequate then will increase back to 100 mg.  Note side effect that can happen with medication is cardiac so best to use lowest dose possible.  Prior EKG in past did not show QT prolongation.  Follow-up in 2 weeks or as needed.  25 minutes spent with pt. 50% of time spent counseling on tx plan.  Mackie Pai, PA-C

## 2019-04-04 ENCOUNTER — Other Ambulatory Visit: Payer: Self-pay

## 2019-04-05 ENCOUNTER — Ambulatory Visit (INDEPENDENT_AMBULATORY_CARE_PROVIDER_SITE_OTHER): Payer: Medicare Other | Admitting: Medical

## 2019-04-05 VITALS — BP 131/65 | HR 72 | Temp 96.9°F | Resp 18 | Ht 66.0 in | Wt 168.0 lb

## 2019-04-05 DIAGNOSIS — Z23 Encounter for immunization: Secondary | ICD-10-CM

## 2019-04-05 DIAGNOSIS — G4700 Insomnia, unspecified: Secondary | ICD-10-CM

## 2019-04-05 DIAGNOSIS — F39 Unspecified mood [affective] disorder: Secondary | ICD-10-CM

## 2019-04-05 MED ORDER — QUETIAPINE FUMARATE 50 MG PO TABS: 50.0000 mg | ORAL_TABLET | Freq: Every day | ORAL | 5 refills | Status: AC

## 2019-04-05 NOTE — Patient Instructions (Addendum)
I am glad to hear that you are sleeping better(insomnia resolved) with seroquel. She states mood feels better as well. No side effects. Will continue seroquel at 50 mg dose.  Flu vaccine today.  Follow up 6 months or prn

## 2019-04-05 NOTE — Progress Notes (Signed)
Subjective:    Patient ID: Jillian Chandler, female    DOB: 02-21-1951, 68 y.o.   MRN: [id number]  HPI Pt here for follow up on insomnia. She had reported some insomnia recently.  Last visit. hpi reads.  "Pt in for follow up on insomnia. Pt states can't sleep due to severe anxiety. Pt family is in [place]. She can't travel to visit family.  Pt in past had been on seroquel in past for mood disorder x 3 years(no known pychosis hx or bipolar).. She used this again recently and she states helped her mood and sleep. Prior ekg last year no qt prolongation.  I had written valium 5 mg to use at night. She states 5 mg and 10 mg did not help."   I had written seroquel since she had used this in the past and she states helped her sleep and by discussion of her history suspect she likely had mood disorder in [place]. I rx'd seroquel and she states sleeps very well and wakes up well rested. Her mood also feels better.   Review of Systems  Constitutional: Negative for chills, fatigue and fever.  Respiratory: Negative for cough, chest tightness, shortness of breath and wheezing.   Cardiovascular: Negative for chest pain and palpitations.  Gastrointestinal: Negative for abdominal pain.  Musculoskeletal: Negative for back pain.  Skin: Negative for rash.  Neurological: Negative for dizziness, weakness and numbness.  Hematological: Negative for adenopathy. Does not bruise/bleed easily.  Psychiatric/Behavioral: Positive for sleep disturbance. Negative for agitation, behavioral problems, decreased concentration and suicidal ideas. The patient is not nervous/anxious.        See hpi.    Past Medical History:  Diagnosis Date  . Breast cancer ([place]) 01/2009   left brest- status post mastectomy, intolerant to Arimidex, declined tamoxfien  . Gastric ulcer    CLO (-) per EGD ~ 11-2011  . Hyperlipidemia   . Hypertension 2006     Social History   Socioeconomic History  . Marital status: Married   Spouse name: Not on file  . Number of children: 4  . Years of education: Not on file  . Highest education level: Not on file  Occupational History  . 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 Financial resource strain: Not on file  . Food insecurity    Worry: Not on file    Inability: Not on file  . Transportation needs    Medical: Not on file    Non-medical: Not on file  Tobacco Use  . Smoking status: Former Smoker    Quit date: 02/17/2007    Years since quitting: 12.1  . Smokeless tobacco: Never Used  Substance and Sexual Activity  . Alcohol use: No  . Drug use: No  . Sexual activity: Not on file  Lifestyle  . Physical activity    Days per week: Not on file    Minutes per session: Not on file  . Stress: Not on file  Relationships  . Social Herbalist on phone: Not on file    Gets together: Not on file    Attends religious service: Not on file    Active member of club or organization: Not on file    Attends meetings of clubs or organizations: Not on file    Relationship status: Not on file  . Intimate partner violence    Fear of current or ex partner: Not on file    Emotionally abused:  Not on file    Physically abused: Not on file    Forced sexual activity: Not on file  Other Topics Concern  . Not on file  Social History Narrative   Original from [place] ---   Diet: very  healthy   Exercise- not frequently but active at work                Past Surgical History:  Procedure Laterality Date  . COLONOSCOPY W/ POLYPECTOMY  11/2011  . MASTECTOMY Left 04/2009  . ROBOTIC ASSISTED BILATERAL SALPINGO OOPHERECTOMY Bilateral 12/09/2017   Procedure: XI ROBOTIC ASSISTED BILATERAL SALPINGO OOPHORECTOMY;  Surgeon: Isabel Caprice, MD;  Location: [facility];  Service: Gynecology;  Laterality: Bilateral;    Family History  Problem Relation Age of Onset  . Heart attack Father 42       F age 20, M age 54s  . Heart disease Father   .  Diabetes Sister   . Diabetes Brother   . Diabetes Sister   . Heart disease Mother   . Diabetes Maternal Aunt   . Diabetes Maternal Uncle   . Diabetes Paternal Aunt   . Diabetes Paternal Uncle   . Diabetes Maternal Grandmother   . Diabetes Maternal Grandfather   . Diabetes Paternal Grandmother   . Diabetes Paternal Grandfather   . Stroke Neg Hx   . Colon cancer Neg Hx   . Breast cancer Neg Hx     No Known Allergies  Current Outpatient Medications on File Prior to Visit  Medication Sig Dispense Refill  . acetaminophen (TYLENOL) 500 MG tablet Take 1 tablet (500 mg total) by mouth every 6 (six) hours as needed. 30 tablet 0  . atorvastatin (LIPITOR) 40 MG tablet TAKE 1 TABLET BY MOUTH ONCE DAILY 90 tablet 0  . b complex vitamins capsule Take 1 capsule by mouth daily.    . Cholecalciferol (VITAMIN D3 PO) Take 3 tablets by mouth daily before breakfast.     . diazepam (VALIUM) 5 MG tablet 1 tab po q hs prn insomnia or anxiety 10 tablet 0  . fluticasone (FLONASE) 50 MCG/ACT nasal spray Place 1 spray into both nostrils daily. 16 g 0  . lisinopril-hydrochlorothiazide (ZESTORETIC) 20-25 MG tablet Take 1 tablet by mouth once daily 90 tablet 0  . meloxicam (MOBIC) 15 MG tablet Take 1 tablet by mouth once daily 90 tablet 0  . metoprolol succinate (TOPROL-XL) 50 MG 24 hr tablet TAKE 1 TABLET BY MOUTH ONCE DAILY.TAKE WITH OR IMMEDIATELY FOLLOWING A MEAL 90 tablet 1  . Omega-3 Fatty Acids (OMEGA 3 PO) Take 2 capsules by mouth daily.    . phenol (CHLORASEPTIC) 1.4 % LIQD Use as directed 1 spray in the mouth or throat as needed for throat irritation / pain. 177 mL 0  . pyridOXINE (VITAMIN B-6) 100 MG tablet Take 100 mg by mouth daily before breakfast.    . vitamin E 400 UNIT capsule Take 1,200 Units by mouth daily before breakfast.      No current facility-administered medications on file prior to visit.     BP 131/65 (BP Location: Right Arm, Patient Position: Sitting, Cuff Size: Small)   Pulse 72    Temp (!) 96.9 F (36.1 C) (Temporal)   Resp 18   Ht 5\' 6"  (1.676 m)   Wt 168 lb (76.2 kg)   SpO2 96%   BMI 27.12 kg/m       Objective:   Physical Exam  General Mental Status- Alert. General  Appearance- Not in acute distress.   Skin General: Color- Normal Color. Moisture- Normal Moisture.  Neck Carotid Arteries- Normal color. Moisture- Normal Moisture. No carotid bruits. No JVD.  Chest and Lung Exam Auscultation: Breath Sounds:-Normal.  Cardiovascular Auscultation:Rythm- Regular. Murmurs & Other Heart Sounds:Auscultation of the heart reveals- No Murmurs.   Neurologic Cranial Nerve exam:- CN III-XII intact(No nystagmus), symmetric smile. Strength:- 5/5 equal and symmetric strength both upper and lower extremities.      Assessment & Plan:  I am glad to hear that you are sleeping better(insomnia resolved) with seroquel. She states mood feels better as well. No side effects. Will continue seroquel at 50 mg dose.  Flu vaccine today.  Follow up 6 months or prn  Mackie Pai, PA-C

## 2019-06-22 ENCOUNTER — Other Ambulatory Visit: Payer: Self-pay | Admitting: Medical

## 2019-06-22 NOTE — Telephone Encounter (Signed)
Pt came in office stating does not have any meds on Lisinopril left and is needing ASAP. Pt stated did not take the med today since does not have any left. Please advise ASAP.

## 2019-09-22 ENCOUNTER — Other Ambulatory Visit: Payer: Self-pay | Admitting: Medical

## 2019-10-02 ENCOUNTER — Encounter: Payer: Self-pay | Admitting: Obstetrics & Gynecology

## 2019-10-02 ENCOUNTER — Ambulatory Visit: Payer: Medicare Other | Admitting: Obstetrics & Gynecology

## 2019-10-02 ENCOUNTER — Other Ambulatory Visit: Payer: Self-pay

## 2019-10-02 VITALS — BP 126/84 | Ht 65.0 in | Wt 177.0 lb

## 2019-10-02 DIAGNOSIS — Z853 Personal history of malignant neoplasm of breast: Secondary | ICD-10-CM

## 2019-10-02 DIAGNOSIS — Z90722 Acquired absence of ovaries, bilateral: Secondary | ICD-10-CM

## 2019-10-02 DIAGNOSIS — C50912 Malignant neoplasm of unspecified site of left female breast: Secondary | ICD-10-CM

## 2019-10-02 DIAGNOSIS — Z13820 Encounter for screening for osteoporosis: Secondary | ICD-10-CM

## 2019-10-02 DIAGNOSIS — Z01419 Encounter for gynecological examination (general) (routine) without abnormal findings: Secondary | ICD-10-CM | POA: Diagnosis not present

## 2019-10-02 DIAGNOSIS — Z780 Asymptomatic menopausal state: Secondary | ICD-10-CM

## 2019-10-02 NOTE — Progress Notes (Signed)
Jillian Chandler [birth year] [id number]   History:    69 y.o. [id number] Married.    RP:  Established patient presenting for annual gyn exam  HPI: S/P Robotic BSO 12/2017 Patho Rt Ovarian benign Mucinous Cyst.  Menopause, well on no HRT.  No PMB.  H/O Lt Breast Cancer s/p Lt Mastectomy.  No pelvic pain.  Normal vaginal secretions.  Abstinent x >10 yrs.  BMs wnl.  BMI 29.45.  Health Labs with Fam MD.   Past medical history,surgical history, family history and social history were all reviewed and documented in the EPIC chart.  Gynecologic History No LMP recorded. Patient is postmenopausal.  Obstetric History OB History  Gravida Para Term Preterm AB Living  4 3 3   1 3   SAB TAB Ectopic Multiple Live Births  1       3    # Outcome Date GA Lbr Len/2nd Weight Sex Delivery Anes PTL Lv  4 SAB           3 Term     M CS-Unspec  N LIV  2 Term     F Vag-Spont  N LIV  1 Term     M Vag-Spont  N LIV     ROS: A ROS was performed and pertinent positives and negatives are included in the history.  GENERAL: No fevers or chills. HEENT: No change in vision, no earache, sore throat or sinus congestion. NECK: No pain or stiffness. CARDIOVASCULAR: No chest pain or pressure. No palpitations. PULMONARY: No shortness of breath, cough or wheeze. GASTROINTESTINAL: No abdominal pain, nausea, vomiting or diarrhea, melena or bright red blood per rectum. GENITOURINARY: No urinary frequency, urgency, hesitancy or dysuria. MUSCULOSKELETAL: No joint or muscle pain, no back pain, no recent trauma. DERMATOLOGIC: No rash, no itching, no lesions. ENDOCRINE: No polyuria, polydipsia, no heat or cold intolerance. No recent change in weight. HEMATOLOGICAL: No anemia or easy bruising or bleeding. NEUROLOGIC: No headache, seizures, numbness, tingling or weakness. PSYCHIATRIC: No depression, no loss of interest in normal activity or change in sleep pattern.     Exam:   BP 126/84   Ht 5\' 5"  (1.651 m)   Wt 177 lb (80.3 kg)    BMI 29.45 kg/m   Body mass index is 29.45 kg/m.  General appearance : Well developed well nourished female. No acute distress HEENT: Eyes: no retinal hemorrhage or exudates,  Neck supple, trachea midline, no carotid bruits, no thyroidmegaly Lungs: Clear to auscultation, no rhonchi or wheezes, or rib retractions  Heart: Regular rate and rhythm, no murmurs or gallops Breast:Examined in sitting and supine position were symmetrical in appearance, no palpable masses or tenderness,  no skin retraction, no nipple inversion, no nipple discharge, no skin discoloration, no axillary or supraclavicular lymphadenopathy Abdomen: no palpable masses or tenderness, no rebound or guarding Extremities: no edema or skin discoloration or tenderness  Pelvic: Vulva: Normal             Vagina: No gross lesions or discharge  Cervix: No gross lesions or discharge  Uterus  AV, normal size, shape and consistency, non-tender and mobile  Adnexa  Without masses or tenderness  Anus: Normal  Patho 12/2017: Diagnosis 1. Ovary and fallopian tube, right - BENIGN MUCINOUS CYST. - BENIGN FALLOPIAN TUBE. - THERE IS NO EVIDENCE OF MALIGNANCY. 2. Ovary and fallopian tube, left - BENIGN OVARY AND FALLOPIAN TUBE. - THERE IS NO EVIDENCE OF MALIGNANCY.   Assessment/Plan:  69 y.o. female for annual exam  1. Well female exam with routine gynecological exam Normal gynecologic exam status post BSO.  Pap test negative in April 2019, no indication to repeat this year.  Breast exam, right normal, status post left mastectomy for left breast cancer.  Right screening mammogram benign in August 2020.  Needs to schedule her first screening colonoscopy.  Health labs with family physician.  Body mass index 29.45.  Recommend decreased calories/carb diet.  Aerobic physical activities 5 times a week and light weightlifting every 2 days.    2. S/P BSO (bilateral salpingo-oophorectomy) Status post BSO.  Pathology: Right benign mucinous cyst  of the ovary.  3. Menopause present Well on no hormone replacement therapy.  No postmenopausal bleeding.   - DG Bone Density; Future  4. Screening for osteoporosis Will schedule a bone density.  Vitamin D supplements, calcium intake of 1200 mg daily and regular weightbearing physical activity is recommended. - DG Bone Density; Future   5. Malignant neoplasm of left female breast, unspecified estrogen receptor status, unspecified site of breast ([place]) Status post left mastectomy.  Princess Bruins MD, 11:03 AM 10/02/2019

## 2019-10-04 ENCOUNTER — Encounter: Payer: Self-pay | Admitting: Obstetrics & Gynecology

## 2019-10-04 NOTE — Patient Instructions (Signed)
1. Well female exam with routine gynecological exam Normal gynecologic exam status post BSO.  Pap test negative in April 2019, no indication to repeat this year.  Breast exam, right normal, status post left mastectomy for left breast cancer.  Right screening mammogram benign in August 2020.  Needs to schedule her first screening colonoscopy.  Health labs with family physician.  Body mass index 29.45.  Recommend decreased calories/carb diet.  Aerobic physical activities 5 times a week and light weightlifting every 2 days.    2. S/P BSO (bilateral salpingo-oophorectomy) Status post BSO.  Pathology: Right benign mucinous cyst of the ovary.  3. Menopause present Well on no hormone replacement therapy.  No postmenopausal bleeding.   - DG Bone Density; Future  4. Screening for osteoporosis Will schedule a bone density.  Vitamin D supplements, calcium intake of 1200 mg daily and regular weightbearing physical activity is recommended. - DG Bone Density; Future   5. Malignant neoplasm of left female breast, unspecified estrogen receptor status, unspecified site of breast ([place]) Status post left mastectomy.  Latitia, it was a pleasure seeing you today!

## 2019-10-23 ENCOUNTER — Other Ambulatory Visit: Payer: Self-pay

## 2019-10-24 ENCOUNTER — Ambulatory Visit (INDEPENDENT_AMBULATORY_CARE_PROVIDER_SITE_OTHER): Payer: Medicare Other

## 2019-10-24 ENCOUNTER — Other Ambulatory Visit: Payer: Self-pay | Admitting: Obstetrics & Gynecology

## 2019-10-24 DIAGNOSIS — Z780 Asymptomatic menopausal state: Secondary | ICD-10-CM | POA: Diagnosis not present

## 2019-10-24 DIAGNOSIS — Z13820 Encounter for screening for osteoporosis: Secondary | ICD-10-CM

## 2019-11-09 ENCOUNTER — Ambulatory Visit: Payer: Medicare Other | Attending: Internal Medicine

## 2019-11-09 DIAGNOSIS — Z23 Encounter for immunization: Secondary | ICD-10-CM

## 2019-11-09 NOTE — Progress Notes (Signed)
   Covid-19 Vaccination Clinic  Name:  Jillian Chandler    MRN: [id number] DOB: 1951-05-23  11/09/2019  Jillian Chandler was observed post Covid-19 immunization for 15 minutes without incident. She was provided with Vaccine Information Sheet and instruction to access the V-Safe system.   Jillian Chandler was instructed to call 911 with any severe reactions post vaccine: Marland Kitchen Difficulty breathing  . Swelling of face and throat  . A fast heartbeat  . A bad rash all over body  . Dizziness and weakness   Immunizations Administered    Name Date Dose VIS Date Route   Pfizer COVID-19 Vaccine 11/09/2019  9:17 AM 0.3 mL 07/14/2019 Intramuscular   Manufacturer: [place]   Lot: [id number]   [place]: [id number]

## 2019-12-05 ENCOUNTER — Ambulatory Visit: Payer: Medicare Other | Attending: Internal Medicine

## 2019-12-05 DIAGNOSIS — Z23 Encounter for immunization: Secondary | ICD-10-CM

## 2019-12-05 NOTE — Progress Notes (Signed)
   Covid-19 Vaccination Clinic  Name:  Lakitta Petriello    MRN: [id number] DOB: 1951/06/15  12/05/2019  Ms. Rendell was observed post Covid-19 immunization for 15 minutes without incident. She was provided with Vaccine Information Sheet and instruction to access the V-Safe system.   Ms. Broaden was instructed to call 911 with any severe reactions post vaccine: Marland Kitchen Difficulty breathing  . Swelling of face and throat  . A fast heartbeat  . A bad rash all over body  . Dizziness and weakness   Immunizations Administered    Name Date Dose VIS Date Route   Pfizer COVID-19 Vaccine 12/05/2019  8:59 AM 0.3 mL 09/27/2018 Intramuscular   Manufacturer: [place]   Lot: [id number]   [place]: [id number]

## 2020-02-23 IMAGING — DX DG WRIST COMPLETE 3+V*R*
4 series · 4 of 4 positions shown · non-contrast
Comparison: None.

CLINICAL DATA: Right wrist discomfort with numbness and tingling.
Fell in [DATE].

EXAM:
RIGHT WRIST - COMPLETE 3+ VIEW

[wrist pa]
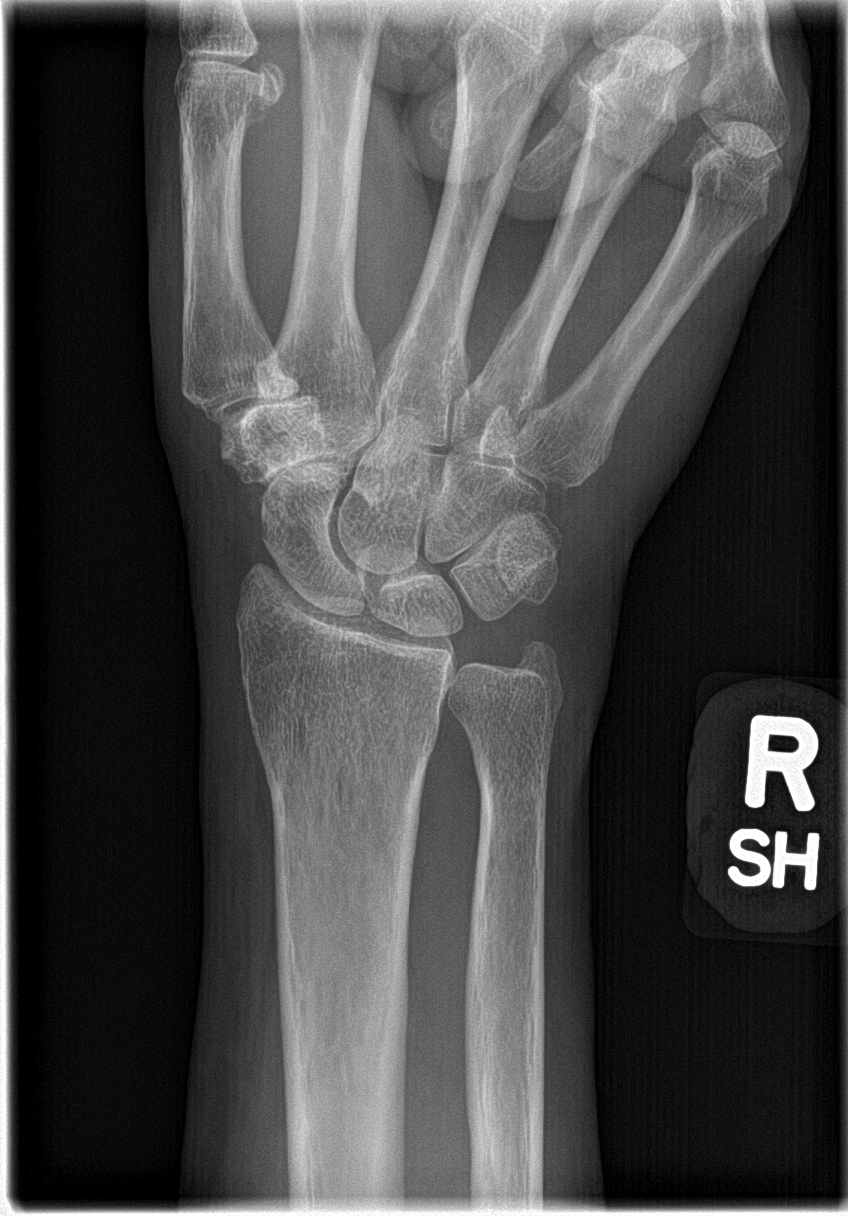

[wrist obl]
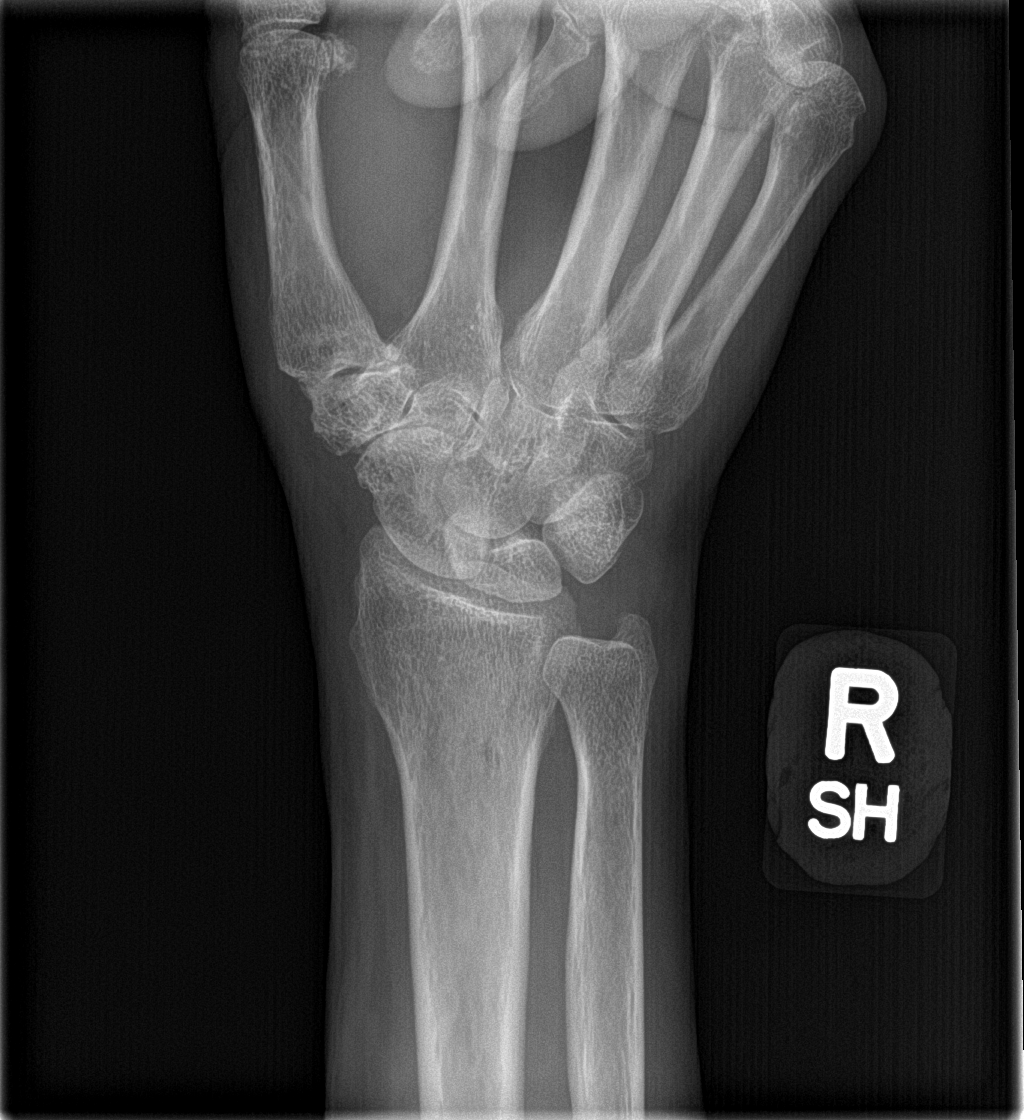

[wrist lat]
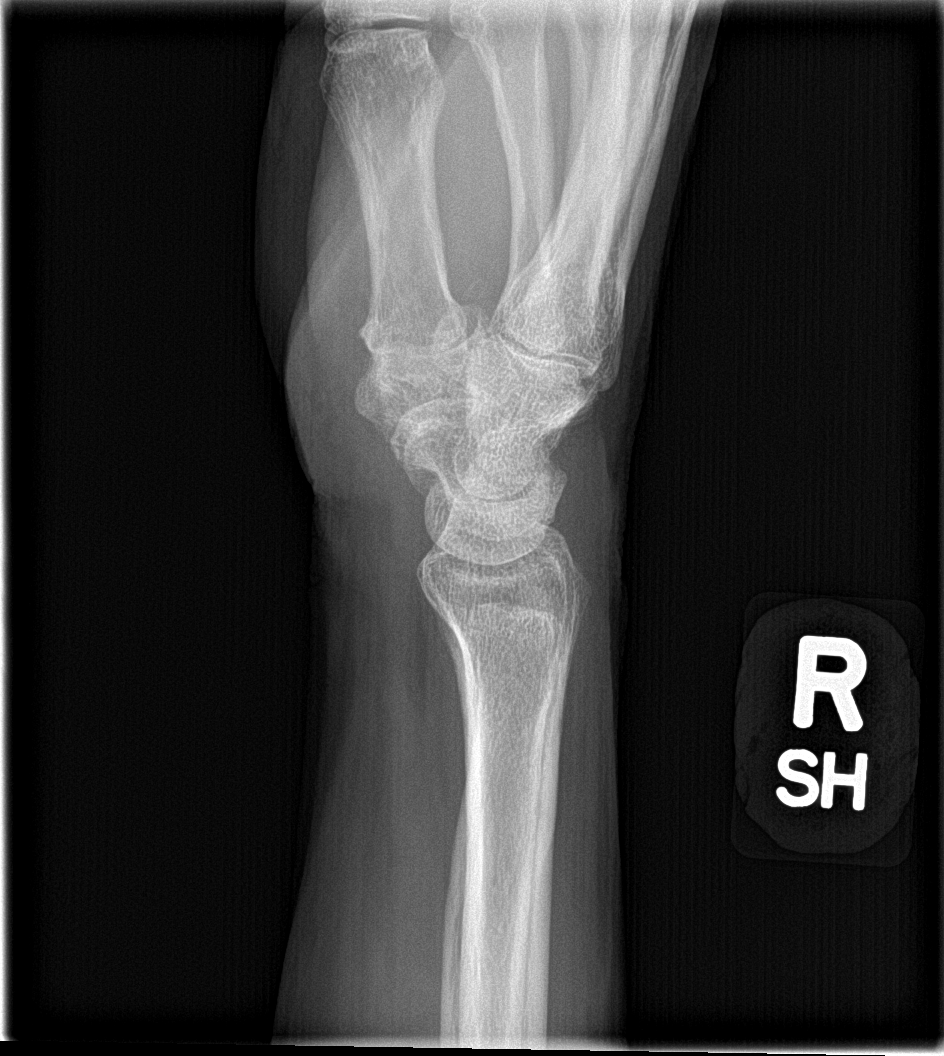

[wrist navicular]
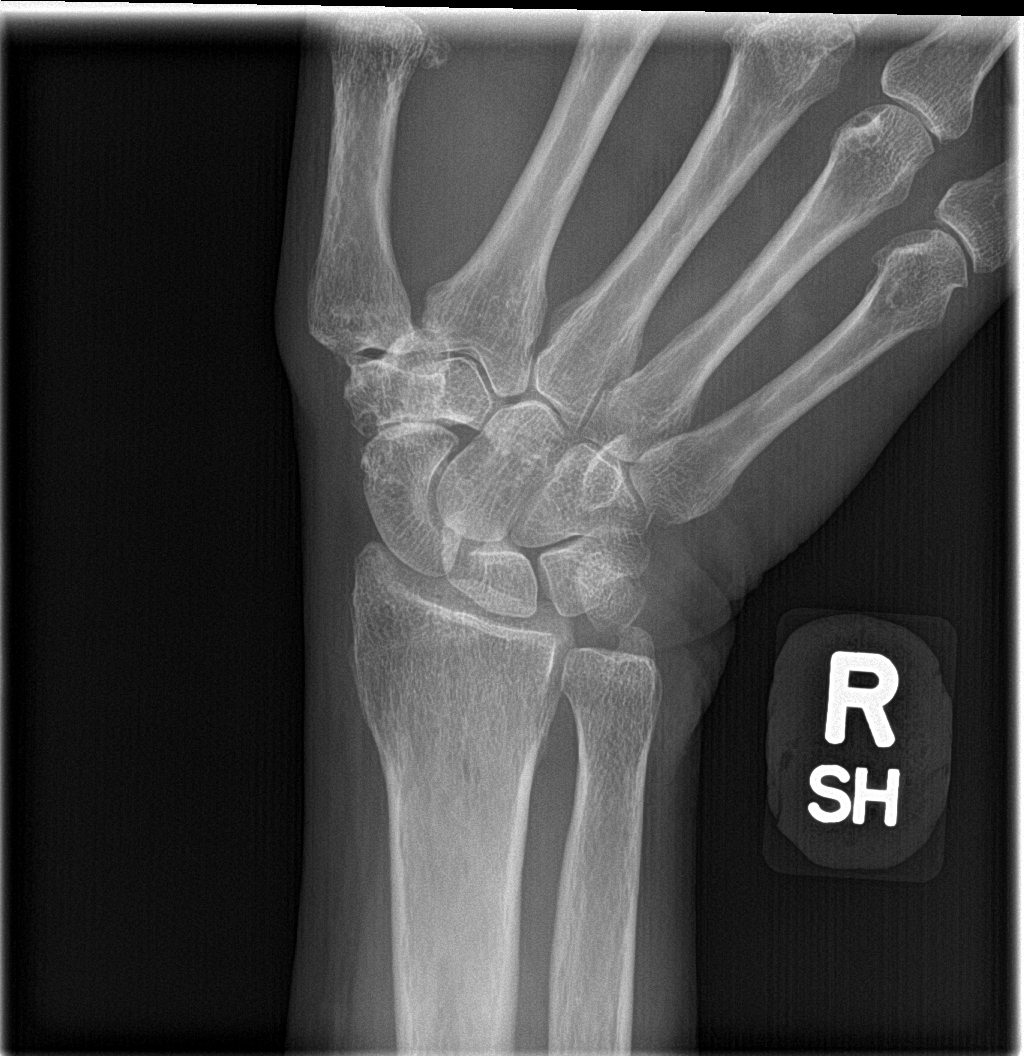

[4 of 4 positions shown; findings below may reference images not displayed]

FINDINGS: There is no evidence of fracture or dislocation. Mild joint space
narrowing and spurring are present at the first CMC and
scaphotrapezial joints. No focal osseous lesion or soft tissue
abnormality is identified.
IMPRESSION: Mild degenerative changes without evidence of fracture or other
acute osseous abnormality.

## 2020-02-23 IMAGING — DX DG HIP (WITH OR WITHOUT PELVIS) 2-3V*R*
3 series · 3 of 3 positions shown · non-contrast
Comparison: 05/21/2017.

CLINICAL DATA: Fall.  Numbness and tingling.

EXAM:
DG HIP (WITH OR WITHOUT PELVIS) 2-3V RIGHT

[pelvis ap]
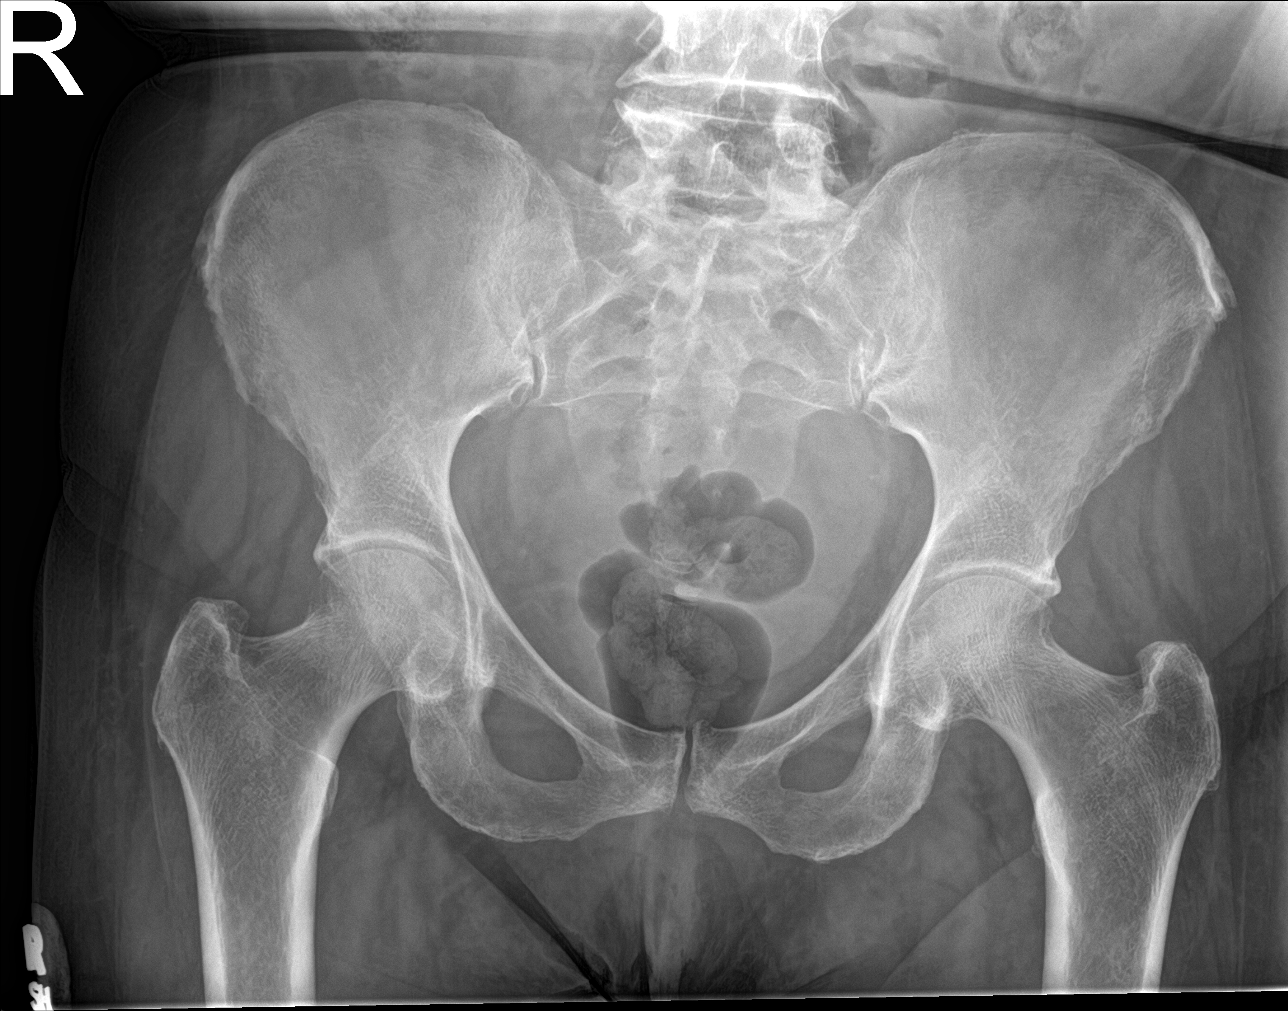

[hip ap]
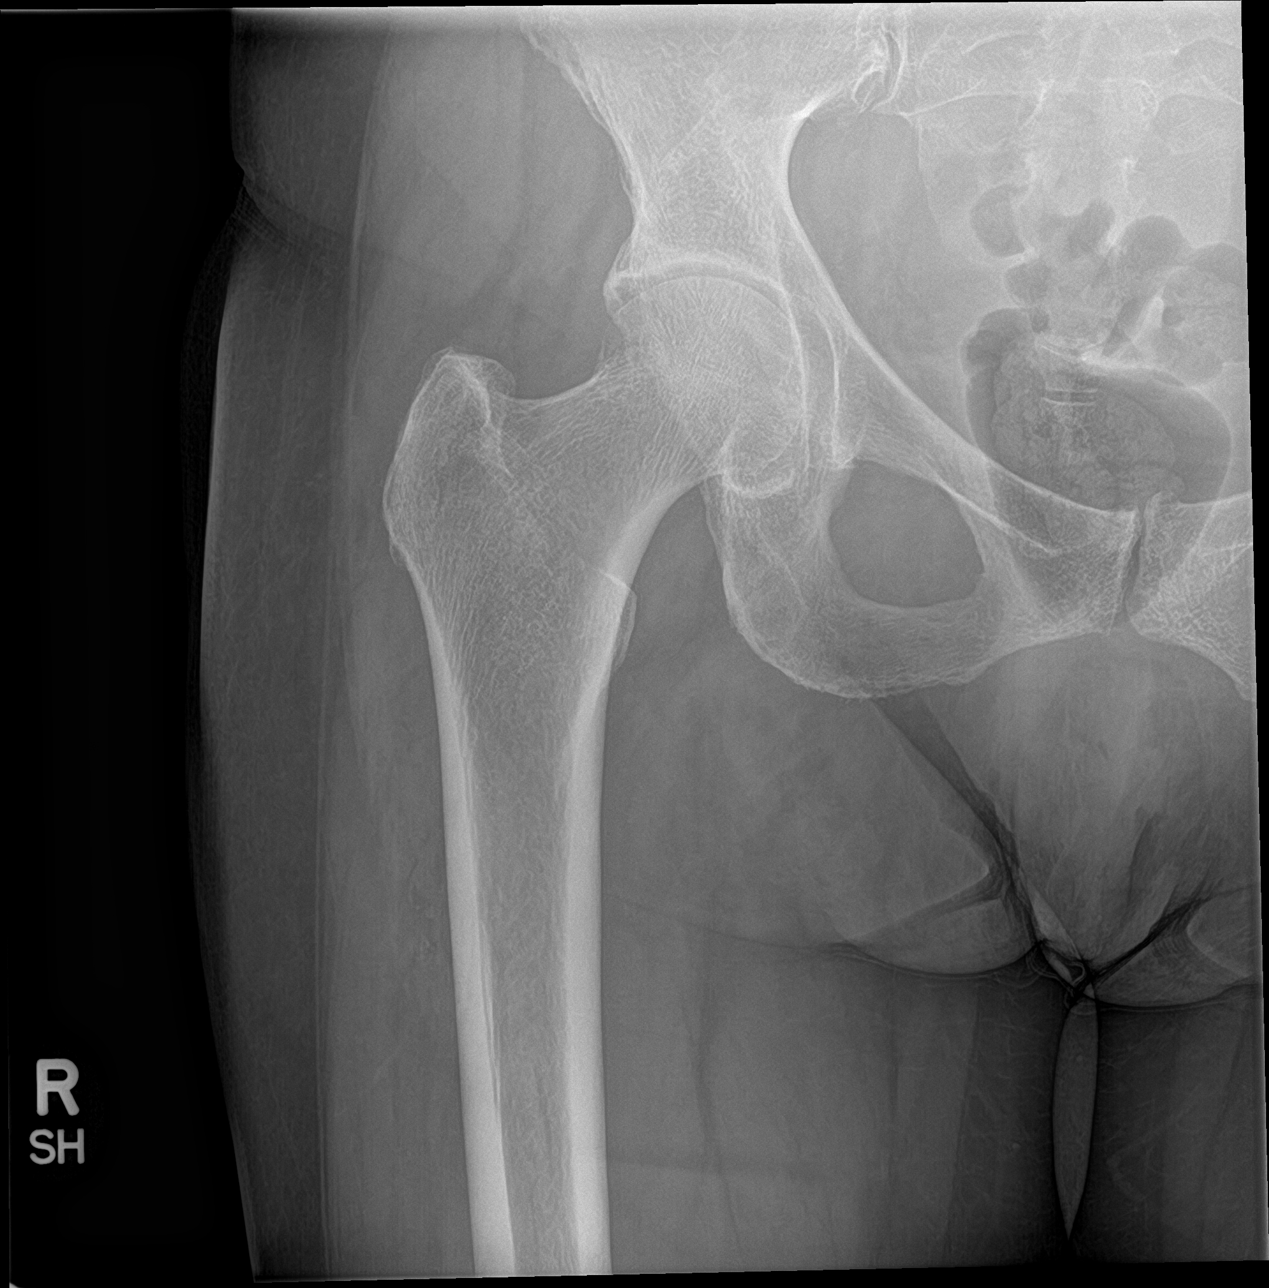

[hip lat]
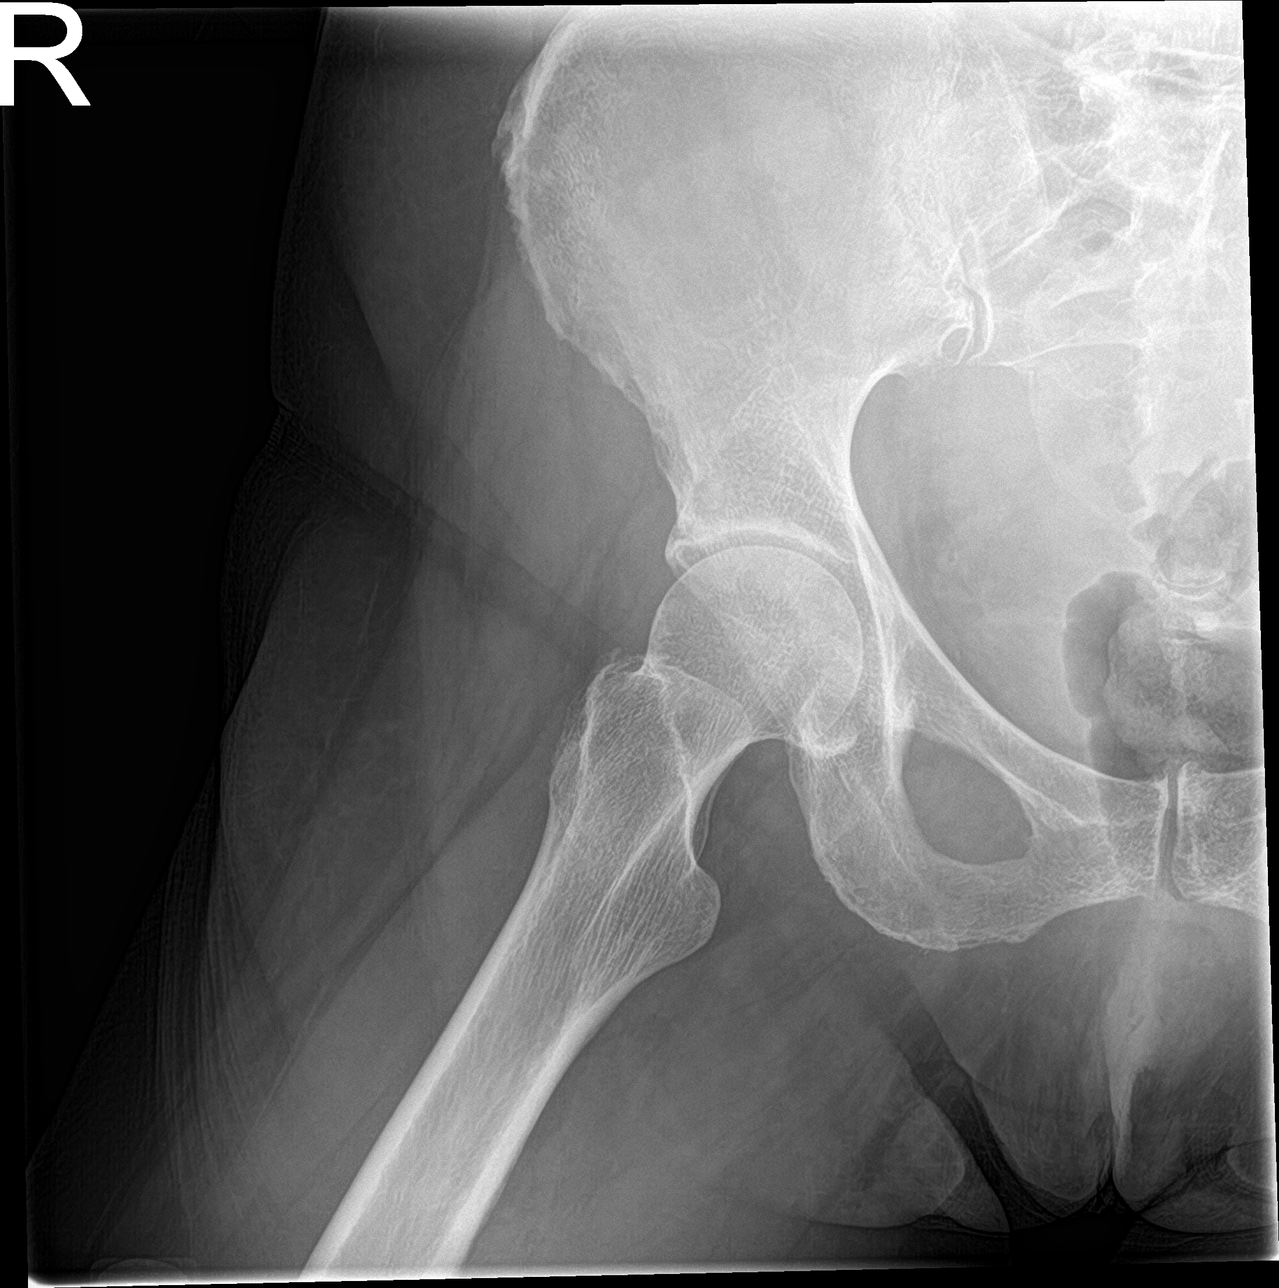

[3 of 3 positions shown; findings below may reference images not displayed]

FINDINGS: Degenerative changes lumbar spine and both hips. Stable tiny
sclerotic density noted the proximal right femur most likely tiny
bone island. No change from prior exam. No acute bony abnormality.
No evidence of fracture or dislocation.
IMPRESSION: Degenerative changes lumbar spine and both hips. No acute
abnormality.

## 2020-04-09 ENCOUNTER — Encounter: Payer: Self-pay | Admitting: Obstetrics and Gynecology

## 2020-06-15 ENCOUNTER — Emergency Department (HOSPITAL_BASED_OUTPATIENT_CLINIC_OR_DEPARTMENT_OTHER): Payer: Medicare Other

## 2020-06-15 ENCOUNTER — Encounter (HOSPITAL_BASED_OUTPATIENT_CLINIC_OR_DEPARTMENT_OTHER): Payer: Self-pay | Admitting: Emergency Medicine

## 2020-06-15 ENCOUNTER — Emergency Department (HOSPITAL_BASED_OUTPATIENT_CLINIC_OR_DEPARTMENT_OTHER)
Admission: EM | Admit: 2020-06-15 | Discharge: 2020-06-15 | Disposition: A | Discharge status: Home / Self Care | Payer: Medicare Other | Attending: Emergency Medicine | Admitting: Emergency Medicine

## 2020-06-15 ENCOUNTER — Other Ambulatory Visit: Payer: Self-pay

## 2020-06-15 DIAGNOSIS — E119 Type 2 diabetes mellitus without complications: Secondary | ICD-10-CM | POA: Insufficient documentation

## 2020-06-15 DIAGNOSIS — Z79899 Other long term (current) drug therapy: Secondary | ICD-10-CM | POA: Diagnosis not present

## 2020-06-15 DIAGNOSIS — I10 Essential (primary) hypertension: Secondary | ICD-10-CM

## 2020-06-15 DIAGNOSIS — R531 Weakness: Secondary | ICD-10-CM | POA: Diagnosis present

## 2020-06-15 DIAGNOSIS — R0602 Shortness of breath: Secondary | ICD-10-CM | POA: Diagnosis not present

## 2020-06-15 DIAGNOSIS — Z853 Personal history of malignant neoplasm of breast: Secondary | ICD-10-CM | POA: Diagnosis not present

## 2020-06-15 DIAGNOSIS — Z87891 Personal history of nicotine dependence: Secondary | ICD-10-CM | POA: Diagnosis not present

## 2020-06-15 LAB — BASIC METABOLIC PANEL
Anion gap: 11 (ref 5–15)
BUN: 15 mg/dL (ref 8–23)
CO2: 28 mmol/L (ref 22–32)
Calcium: 9.5 mg/dL (ref 8.9–10.3)
Chloride: 101 mmol/L (ref 98–111)
Creatinine, Ser: 0.46 mg/dL (ref 0.44–1.00)
GFR, Estimated: >60 mL/min (ref >60)
Glucose, Bld: 115 mg/dL — ABNORMAL HIGH (ref 70–99)
Potassium: 3.8 mmol/L (ref 3.5–5.1)
Sodium: 140 mmol/L (ref 135–145)

## 2020-06-15 LAB — CBC WITH DIFFERENTIAL/PLATELET
Abs Immature Granulocytes: 0.01 10*3/uL (ref 0.00–0.07)
Basophils Absolute: 0.1 10*3/uL (ref 0.0–0.1)
Basophils Relative: 1 %
Eosinophils Absolute: 0.2 10*3/uL (ref 0.0–0.5)
Eosinophils Relative: 3 %
HCT: 43.1 % (ref 36.0–46.0)
Hemoglobin: 14.2 g/dL (ref 12.0–15.0)
Immature Granulocytes: 0 %
Lymphocytes Relative: 38 %
Lymphs Abs: 2.3 10*3/uL (ref 0.7–4.0)
MCH: 28 pg (ref 26.0–34.0)
MCHC: 32.9 g/dL (ref 30.0–36.0)
MCV: 85 fL (ref 80.0–100.0)
Monocytes Absolute: 0.6 10*3/uL (ref 0.1–1.0)
Monocytes Relative: 9 %
Neutro Abs: 3 10*3/uL (ref 1.7–7.7)
Neutrophils Relative %: 49 %
Platelets: 275 10*3/uL (ref 150–400)
RBC: 5.07 MIL/uL (ref 3.87–5.11)
RDW: 13.2 % (ref 11.5–15.5)
WBC: 6.1 10*3/uL (ref 4.0–10.5)
nRBC: 0 % (ref 0.0–0.2)

## 2020-06-15 LAB — URINALYSIS, ROUTINE W REFLEX MICROSCOPIC
Bilirubin Urine: NEGATIVE
Glucose, UA: NEGATIVE mg/dL
Ketones, ur: NEGATIVE mg/dL
Leukocytes,Ua: NEGATIVE
Nitrite: NEGATIVE
Protein, ur: NEGATIVE mg/dL
Specific Gravity, Urine: 1.01 (ref 1.005–1.030)
pH: 6.5 (ref 5.0–8.0)

## 2020-06-15 LAB — URINALYSIS, MICROSCOPIC (REFLEX)

## 2020-06-15 LAB — TROPONIN I (HIGH SENSITIVITY): Troponin I (High Sensitivity): 2 ng/L (ref <18)

## 2020-06-15 LAB — BRAIN NATRIURETIC PEPTIDE: B Natriuretic Peptide: 40.2 pg/mL (ref 0.0–100.0)

## 2020-06-15 MED ORDER — ACETAMINOPHEN 500 MG PO TABS
1000.0000 mg | ORAL_TABLET | Freq: Once | ORAL | Status: AC
  Administered 2020-06-15: 1000 mg via ORAL
  Filled 2020-06-15: qty 2

## 2020-06-15 NOTE — ED Provider Notes (Signed)
[facility] EMERGENCY DEPARTMENT Provider Note   CSN: [id number] Arrival date & time: 06/15/20  0756     History Chief Complaint  Patient presents with  . Weakness    Jillian Chandler is a 69 y.o. female.  Pt presents to the ED today with weakness.  Pt said she felt weak all over this am.  She took her bp and it was elevated (sbp 170s).  She took her bp meds and then came here.  BP is now 154/78.  She did have a headache and a little neck pain.        Past Medical History:  Diagnosis Date  . Breast cancer ([place]) 01/2009   left brest- status post mastectomy, intolerant to Arimidex, declined tamoxfien  . Gastric ulcer    CLO (-) per EGD ~ 11-2011  . Hyperlipidemia   . Hypertension 2006    Patient Active Problem List   Diagnosis Date Noted  . Left lateral epicondylitis 02/28/2017  . Left shoulder pain 02/28/2017  . Bilateral knee pain 02/25/2017  . Breast cancer, left breast ([place]) 10/02/2013  . Hyperlipidemia 07/26/2013  . Chest pain with moderate risk for cardiac etiology 07/06/2013  . Headache(784.0) 10/03/2012  . Menopause 02/17/2012  . Diabetes mellitus ([place]) 01/25/2012  . PUD (peptic ulcer disease) 01/25/2012  . Diverticulitis 07/20/2011  . General medical examination 06/18/2011  . Back pain 11/25/2010  . DEGENERATIVE JOINT DISEASE 01/21/2010  . Essential hypertension 08/12/2009  . BREAST CANCER, HX OF 08/12/2009    Past Surgical History:  Procedure Laterality Date  . BREAST SURGERY    . COLONOSCOPY W/ POLYPECTOMY  11/2011  . MASTECTOMY Left 04/2009  . MASTECTOMY Left   . ROBOTIC ASSISTED BILATERAL SALPINGO OOPHERECTOMY Bilateral 12/09/2017   Procedure: XI ROBOTIC ASSISTED BILATERAL SALPINGO OOPHORECTOMY;  Surgeon: Isabel Caprice, MD;  Location: [facility];  Service: Gynecology;  Laterality: Bilateral;     OB History    Gravida  4   Para  3   Term  3   Preterm      AB  1   Living  3     SAB  1   TAB      Ectopic      Multiple       Live Births  3           Family History  Problem Relation Age of Onset  . Heart attack Father 74       F age 57, M age 56s  . Heart disease Father   . Diabetes Sister   . Diabetes Brother   . Diabetes Sister   . Heart disease Mother   . Diabetes Maternal Aunt   . Diabetes Maternal Uncle   . Diabetes Paternal Aunt   . Diabetes Paternal Uncle   . Diabetes Maternal Grandmother   . Diabetes Maternal Grandfather   . Diabetes Paternal Grandmother   . Diabetes Paternal Grandfather   . Stroke Neg Hx   . Colon cancer Neg Hx   . Breast cancer Neg Hx     Social History   Tobacco Use  . Smoking status: Former Smoker    Quit date: 02/17/2007    Years since quitting: 13.3  . Smokeless tobacco: Never Used  Vaping Use  . Vaping Use: Never used  Substance Use Topics  . Alcohol use: No  . Drug use: No    Home Medications Prior to Admission medications   Medication Sig Start Date End Date Taking?  Authorizing Provider  acetaminophen (TYLENOL) 500 MG tablet Take 1 tablet (500 mg total) by mouth every 6 (six) hours as needed. 09/15/15  Yes Antonietta Breach, PA-C  meloxicam (MOBIC) 15 MG tablet Take 1 tablet by mouth once daily 03/21/19  Yes Saguier, Percell Miller, PA-C  meloxicam (MOBIC) 15 MG tablet Take 1 tablet by mouth daily. 04/17/20  Yes [provider]  metoprolol succinate (TOPROL-XL) 50 MG 24 hr tablet TAKE 1 TABLET BY MOUTH ONCE DAILY **TAKE  WITH  OR  IMMEDIATELY  FOLLOWING  A  MEAL 09/23/19  Yes Saguier, Percell Miller, PA-C  olmesartan-hydrochlorothiazide (BENICAR HCT) 40-25 MG tablet Take 1 tablet by mouth daily. 04/17/20  Yes [provider]  rosuvastatin (CRESTOR) 10 MG tablet Take by mouth. 04/17/20  Yes [provider]  atorvastatin (LIPITOR) 40 MG tablet TAKE 1 TABLET BY MOUTH ONCE DAILY 09/05/18   Saguier, Percell Miller, PA-C  b complex vitamins capsule Take 1 capsule by mouth daily.    [provider]  Cholecalciferol (VITAMIN D3 PO) Take 3 tablets by mouth daily  before breakfast.     [provider]  diazepam (VALIUM) 5 MG tablet 1 tab po q hs prn insomnia or anxiety 03/17/19   Saguier, Percell Miller, PA-C  fluticasone [facility]) 50 MCG/ACT nasal spray Place 1 spray into both nostrils daily. 07/30/18   Caccavale, Sophia, PA-C  lisinopril-hydrochlorothiazide (ZESTORETIC) 20-25 MG tablet Take 1 tablet by mouth once daily Patient not taking: Reported on 06/15/2020 06/22/19   Saguier, Percell Miller, PA-C  Omega-3 Fatty Acids (OMEGA 3 PO) Take 2 capsules by mouth daily.    [provider]  phenol (CHLORASEPTIC) 1.4 % LIQD Use as directed 1 spray in the mouth or throat as needed for throat irritation / pain. 07/30/18   Caccavale, Sophia, PA-C  pyridOXINE (VITAMIN B-6) 100 MG tablet Take 100 mg by mouth daily before breakfast.    [provider]  QUEtiapine (SEROQUEL) 50 MG tablet Take 1 tablet (50 mg total) by mouth at bedtime. 04/05/19   Saguier, Percell Miller, PA-C  vitamin E 400 UNIT capsule Take 1,200 Units by mouth daily before breakfast.     [provider]    Allergies    Patient has no known allergies.  Review of Systems   Review of Systems  Neurological: Positive for headaches.  All other systems reviewed and are negative.   Physical Exam Updated Vital Signs BP (!) 160/77   Pulse 70   Temp 98.2 F (36.8 C) (Oral)   Resp 14   Ht 5\' 7"  (1.702 m)   Wt 77.5 kg   SpO2 98%   BMI 26.75 kg/m   Physical Exam Vitals and nursing note reviewed.  Constitutional:      Appearance: Normal appearance.  HENT:     Head: Normocephalic and atraumatic.     Right Ear: External ear normal.     Left Ear: External ear normal.     Nose: Nose normal.     Mouth/Throat:     Mouth: Mucous membranes are moist.     Pharynx: Oropharynx is clear.  Eyes:     Extraocular Movements: Extraocular movements intact.     Conjunctiva/sclera: Conjunctivae normal.     Pupils: Pupils are equal, round, and reactive to light.  Cardiovascular:     Rate and  Rhythm: Normal rate and regular rhythm.     Pulses: Normal pulses.     Heart sounds: Normal heart sounds.  Pulmonary:     Effort: Pulmonary effort is normal.  Breath sounds: Normal breath sounds.  Abdominal:     General: Abdomen is flat. Bowel sounds are normal.     Palpations: Abdomen is soft.  Musculoskeletal:        General: Normal range of motion.     Cervical back: Normal range of motion and neck supple.  Skin:    General: Skin is warm.     Capillary Refill: Capillary refill takes less than 2 seconds.  Neurological:     General: No focal deficit present.     Mental Status: She is alert and oriented to person, place, and time.  Psychiatric:        Mood and Affect: Mood normal.        Behavior: Behavior normal.        Thought Content: Thought content normal.        Judgment: Judgment normal.     ED Results / Procedures / Treatments   Labs (all labs ordered are listed, but only abnormal results are displayed) Labs Reviewed  BASIC METABOLIC PANEL - Abnormal; Notable for the following components:      Result Value   Glucose, Bld 115 (*)    All other components within normal limits  URINALYSIS, ROUTINE W REFLEX MICROSCOPIC - Abnormal; Notable for the following components:   Hgb urine dipstick TRACE (*)    All other components within normal limits  URINALYSIS, MICROSCOPIC (REFLEX) - Abnormal; Notable for the following components:   Bacteria, UA FEW (*)    All other components within normal limits  CBC WITH DIFFERENTIAL/PLATELET  BRAIN NATRIURETIC PEPTIDE  TROPONIN I (HIGH SENSITIVITY)  TROPONIN I (HIGH SENSITIVITY)    EKG EKG Interpretation  Date/Time:  Saturday June 15 2020 08:27:51 EST Ventricular Rate:  68 PR Interval:    QRS Duration: 96 QT Interval:  383 QTC Calculation: 408 R Axis:   21 Text Interpretation: Sinus rhythm No significant change since last tracing Confirmed by Isla Pence [phone]) on 06/15/2020 8:31:16 AM   Radiology DG Chest 2  View  Result Date: 06/15/2020 CLINICAL DATA:  Hypertension and weakness EXAM: CHEST - 2 VIEW COMPARISON:  May 02, 2016 FINDINGS: Lungs are clear. Heart size and pulmonary vascularity are normal. No adenopathy. There is aortic atherosclerosis. There are foci of degenerative change in the thoracic spine. IMPRESSION: Lungs are clear.  Heart size normal. Aortic Atherosclerosis (ICD10-I70.0). Electronically Signed   By: Lowella Grip III M.D.   On: 06/15/2020 08:53   CT Head Wo Contrast  Result Date: 06/15/2020 CLINICAL DATA:  Headache and dizziness EXAM: CT HEAD WITHOUT CONTRAST TECHNIQUE: Contiguous axial images were obtained from the base of the skull through the vertex without intravenous contrast. COMPARISON:  Brain MRI October 13, 2019 FINDINGS: Brain: Ventricles and sulci are normal in size and configuration. There is stable cerebellar tonsillar ectopia consistent with Chiari I malformation. No intracranial mass, hemorrhage, extra-axial fluid collection, or midline shift. A punctate focus of calcification is noted in the anterior right centrum semiovale. A similar punctate calcification is noted in the right parietal lobe in the posterosuperior centrum semiovale. A third similar calcification is noted in the posterior left temporal lobe. Elsewhere brain parenchyma appears unremarkable. No edema evident. No evident acute infarct. Vascular: No hyperdense vessels. No appreciable vascular calcification. Skull: The bony calvarium appears intact. Sinuses/Orbits: Visualized paranasal sinuses are clear visualized orbits appear symmetric bilaterally. Other: Visualized mastoid air cells are clear. IMPRESSION: 1. Stable cerebellar tonsillar ectopia consistent with Chiari I malformation. Ventricles and sulci appear normal. 2.  Occasional punctate calcifications without surrounding edema. Suspect small granulomas. 3. Elsewhere brain parenchyma appears unremarkable. No acute infarct. No mass or hemorrhage  evident. Electronically Signed   By: Lowella Grip III M.D.   On: 06/15/2020 09:41    Procedures Procedures (including critical care time)  Medications Ordered in ED Medications  acetaminophen (TYLENOL) tablet 1,000 mg (1,000 mg Oral Given 06/15/20 0847)    ED Course  I have reviewed the triage vital signs and the nursing notes.  Pertinent labs & imaging results that were available during my care of the patient were reviewed by me and considered in my medical decision making (see chart for details).    MDM Rules/Calculators/A&P                          Work up is unremarkable.  Pt is neurologically intact and sx have improved, so I don't think she needs a MRI or a CTA head/neck.  She is feeling better after tylenol.  She is still slightly hypertensive (SBP 160).  She is to go home and rest and to eat a low salt diet.  Return if worse.  F/u with pcp.   Final Clinical Impression(s) / ED Diagnoses Final diagnoses:  Weakness  Primary hypertension    Rx / DC Orders ED Discharge Orders    None       Isla Pence, MD 06/15/20 [phone]

## 2020-06-15 NOTE — ED Triage Notes (Addendum)
States," My BP is high and I feel weak" Denies chest pain or SOB, feels like" her head is big" "pressure"

## 2020-06-15 NOTE — ED Notes (Signed)
Patient transported to CT 

## 2020-12-04 ENCOUNTER — Telehealth: Payer: Self-pay

## 2020-12-04 NOTE — Telephone Encounter (Signed)
Pt called access nurse stating she has been experiencing inflammation on BUE for a while where lymph nodes were removed.  This LPN attempted to call pt and LVM to return call to office.

## 2021-05-03 ENCOUNTER — Emergency Department (HOSPITAL_BASED_OUTPATIENT_CLINIC_OR_DEPARTMENT_OTHER)
Admission: EM | Admit: 2021-05-03 | Discharge: 2021-05-03 | Disposition: A | Discharge status: Home / Self Care | Payer: Medicare Other | Attending: Emergency Medicine | Admitting: Emergency Medicine

## 2021-05-03 ENCOUNTER — Encounter (HOSPITAL_BASED_OUTPATIENT_CLINIC_OR_DEPARTMENT_OTHER): Payer: Self-pay | Admitting: Emergency Medicine

## 2021-05-03 ENCOUNTER — Emergency Department (HOSPITAL_BASED_OUTPATIENT_CLINIC_OR_DEPARTMENT_OTHER): Payer: Medicare Other

## 2021-05-03 ENCOUNTER — Other Ambulatory Visit: Payer: Self-pay

## 2021-05-03 DIAGNOSIS — Z87891 Personal history of nicotine dependence: Secondary | ICD-10-CM | POA: Insufficient documentation

## 2021-05-03 DIAGNOSIS — Z79899 Other long term (current) drug therapy: Secondary | ICD-10-CM | POA: Diagnosis not present

## 2021-05-03 DIAGNOSIS — Z853 Personal history of malignant neoplasm of breast: Secondary | ICD-10-CM | POA: Diagnosis not present

## 2021-05-03 DIAGNOSIS — W19XXXA Unspecified fall, initial encounter: Secondary | ICD-10-CM

## 2021-05-03 DIAGNOSIS — E119 Type 2 diabetes mellitus without complications: Secondary | ICD-10-CM | POA: Insufficient documentation

## 2021-05-03 DIAGNOSIS — M25551 Pain in right hip: Secondary | ICD-10-CM | POA: Diagnosis not present

## 2021-05-03 DIAGNOSIS — I10 Essential (primary) hypertension: Secondary | ICD-10-CM | POA: Diagnosis not present

## 2021-05-03 HISTORY — DX: Unspecified osteoarthritis, unspecified site: M19.90

## 2021-05-03 MED ORDER — HYDROCODONE-ACETAMINOPHEN 5-325 MG PO TABS
1.0000 | ORAL_TABLET | Freq: Once | ORAL | Status: AC
  Administered 2021-05-03: 1 via ORAL
  Filled 2021-05-03: qty 1

## 2021-05-03 MED ORDER — METHOCARBAMOL 500 MG PO TABS
500.0000 mg | ORAL_TABLET | Freq: Once | ORAL | Status: AC
  Administered 2021-05-03: 500 mg via ORAL
  Filled 2021-05-03: qty 1

## 2021-05-03 MED ORDER — METHOCARBAMOL 500 MG PO TABS: 500.0000 mg | ORAL_TABLET | Freq: Two times a day (BID) | ORAL | 0 refills | Status: AC | PRN

## 2021-05-03 NOTE — ED Triage Notes (Signed)
Pt c/o right hip pain, no relief with Tylenol or Meloxicam (rx'ed for hand and joint pain), pain radiates to right knee, denies injury

## 2021-05-03 NOTE — ED Provider Notes (Signed)
[facility] Provider Note   CSN: [id number] Arrival date & time: 05/03/21  9476     History Chief Complaint  Patient presents with   Hip Pain    Jillian Chandler is a 70 y.o. female.  The history is provided by the patient. The history is limited by a language barrier. A language interpreter was used.  Hip Pain Pertinent negatives include no chest pain, no abdominal pain, no headaches and no shortness of breath. Patient presents for acute on chronic right hip pain that radiates to her right knee.  She has a long history of arthritis.  She previously got injections into her right hip area.  Last injection was estimated to be 8 years ago.  She takes daily meloxicam for arthritic pain.  Last dose was early this morning.  She took some Tylenol last night.  She reports that, since last night, she has had worsening severity of this pain.  She denies any recent injuries.  She denies any recent systemic symptoms.  Other medical history is notable for hypertension and breast cancer.     Past Medical History:  Diagnosis Date   Arthritis    Breast cancer ([place]) 01/2009   left brest- status post mastectomy, intolerant to Arimidex, declined tamoxfien   Gastric ulcer    CLO (-) per EGD ~ 11-2011   Hyperlipidemia    Hypertension 2006    Patient Active Problem List   Diagnosis Date Noted   Left lateral epicondylitis 02/28/2017   Left shoulder pain 02/28/2017   Bilateral knee pain 02/25/2017   Breast cancer, left breast ([place]) 10/02/2013   Hyperlipidemia 07/26/2013   Chest pain with moderate risk for cardiac etiology 07/06/2013   Headache(784.0) 10/03/2012   Menopause 02/17/2012   Diabetes mellitus ([place]) 01/25/2012   PUD (peptic ulcer disease) 01/25/2012   Diverticulitis 07/20/2011   General medical examination 06/18/2011   Back pain 11/25/2010   DEGENERATIVE JOINT DISEASE 01/21/2010   Essential hypertension 08/12/2009   BREAST CANCER, HX OF 08/12/2009     Past Surgical History:  Procedure Laterality Date   BREAST SURGERY     COLONOSCOPY W/ POLYPECTOMY  11/2011   MASTECTOMY Left 04/2009   MASTECTOMY Left    ROBOTIC ASSISTED BILATERAL SALPINGO OOPHERECTOMY Bilateral 12/09/2017   Procedure: XI ROBOTIC ASSISTED BILATERAL SALPINGO OOPHORECTOMY;  Surgeon: Isabel Caprice, MD;  Location: [facility];  Service: Gynecology;  Laterality: Bilateral;     OB History     Gravida  4   Para  3   Term  3   Preterm      AB  1   Living  3      SAB  1   IAB      Ectopic      Multiple      Live Births  3           Family History  Problem Relation Age of Onset   Heart attack Father 78       F age 33, M age 36s   Heart disease Father    Diabetes Sister    Diabetes Brother    Diabetes Sister    Heart disease Mother    Diabetes Maternal Aunt    Diabetes Maternal Uncle    Diabetes Paternal Aunt    Diabetes Paternal Uncle    Diabetes Maternal Grandmother    Diabetes Maternal Grandfather    Diabetes Paternal Grandmother    Diabetes Paternal Grandfather    Stroke  Neg Hx    Colon cancer Neg Hx    Breast cancer Neg Hx     Social History   Tobacco Use   Smoking status: Former    Types: Cigarettes    Quit date: 02/17/2007    Years since quitting: 14.2   Smokeless tobacco: Never  Vaping Use   Vaping Use: Never used  Substance Use Topics   Alcohol use: No   Drug use: No    Home Medications Prior to Admission medications   Medication Sig Start Date End Date Taking? Authorizing Provider  methocarbamol (ROBAXIN) 500 MG tablet Take 1 tablet (500 mg total) by mouth 2 (two) times daily as needed for up to 4 days for muscle spasms. 05/03/21 05/07/21 Yes Godfrey Pick, MD  acetaminophen (TYLENOL) 500 MG tablet Take 1 tablet (500 mg total) by mouth every 6 (six) hours as needed. 09/15/15   Antonietta Breach, PA-C  atorvastatin (LIPITOR) 40 MG tablet TAKE 1 TABLET BY MOUTH ONCE DAILY 09/05/18   Saguier, Percell Miller, PA-C  b complex vitamins capsule  Take 1 capsule by mouth daily.    [provider]  Cholecalciferol (VITAMIN D3 PO) Take 3 tablets by mouth daily before breakfast.     [provider]  diazepam (VALIUM) 5 MG tablet 1 tab po q hs prn insomnia or anxiety 03/17/19   Saguier, Percell Miller, PA-C  fluticasone [facility]) 50 MCG/ACT nasal spray Place 1 spray into both nostrils daily. 07/30/18   Caccavale, Sophia, PA-C  lisinopril-hydrochlorothiazide (ZESTORETIC) 20-25 MG tablet Take 1 tablet by mouth once daily Patient not taking: Reported on 06/15/2020 06/22/19   Saguier, Percell Miller, PA-C  meloxicam [facility]) 15 MG tablet Take 1 tablet by mouth once daily 03/21/19   Saguier, Percell Miller, PA-C  meloxicam (MOBIC) 15 MG tablet Take 1 tablet by mouth daily. 04/17/20   [provider]  metoprolol succinate (TOPROL-XL) 50 MG 24 hr tablet TAKE 1 TABLET BY MOUTH ONCE DAILY **TAKE  WITH  OR  IMMEDIATELY  FOLLOWING  A  MEAL 09/23/19   Saguier, Percell Miller, PA-C  olmesartan-hydrochlorothiazide (BENICAR HCT) 40-25 MG tablet Take 1 tablet by mouth daily. 04/17/20   [provider]  Omega-3 Fatty Acids (OMEGA 3 PO) Take 2 capsules by mouth daily.    [provider]  phenol (CHLORASEPTIC) 1.4 % LIQD Use as directed 1 spray in the mouth or throat as needed for throat irritation / pain. 07/30/18   Caccavale, Sophia, PA-C  pyridOXINE (VITAMIN B-6) 100 MG tablet Take 100 mg by mouth daily before breakfast.    [provider]  QUEtiapine (SEROQUEL) 50 MG tablet Take 1 tablet (50 mg total) by mouth at bedtime. 04/05/19   Saguier, Percell Miller, PA-C  rosuvastatin (CRESTOR) 10 MG tablet Take by mouth. 04/17/20   [provider]  vitamin E 400 UNIT capsule Take 1,200 Units by mouth daily before breakfast.     [provider]    Allergies    Patient has no known allergies.  Review of Systems   Review of Systems  Constitutional:  Negative for chills, fatigue and fever.  HENT:  Negative for ear pain and sore throat.    Eyes:  Negative for pain and visual disturbance.  Respiratory:  Negative for cough and shortness of breath.   Cardiovascular:  Negative for chest pain and palpitations.  Gastrointestinal:  Negative for abdominal pain, diarrhea, nausea and vomiting.  Genitourinary:  Negative for difficulty urinating, dysuria, flank pain, hematuria, pelvic pain and urgency.  Musculoskeletal:  Positive for  arthralgias. Negative for back pain, joint swelling and neck pain.  Skin:  Negative for color change and rash.  Neurological:  Negative for dizziness, seizures, syncope, weakness, light-headedness, numbness and headaches.  All other systems reviewed and are negative.  Physical Exam Updated Vital Signs BP 136/72 (BP Location: Right Arm)   Pulse 73   Temp 98.1 F (36.7 C) (Oral)   Resp 17   Ht 5\' 7"  (1.702 m)   Wt 79.8 kg   SpO2 100%   BMI 27.57 kg/m   Physical Exam Vitals and nursing note reviewed.  Constitutional:      General: She is not in acute distress.    Appearance: Normal appearance. She is well-developed and normal weight. She is not ill-appearing, toxic-appearing or diaphoretic.  HENT:     Head: Normocephalic and atraumatic.     Right Ear: External ear normal.     Left Ear: External ear normal.     Nose: Nose normal.  Eyes:     Extraocular Movements: Extraocular movements intact.     Conjunctiva/sclera: Conjunctivae normal.  Cardiovascular:     Rate and Rhythm: Normal rate and regular rhythm.     Heart sounds: No murmur heard. Pulmonary:     Effort: Pulmonary effort is normal. No respiratory distress.     Breath sounds: Normal breath sounds. No wheezing.  Chest:     Chest wall: No tenderness.  Abdominal:     Palpations: Abdomen is soft.     Tenderness: There is no abdominal tenderness.  Musculoskeletal:        General: Tenderness present. No swelling, deformity or signs of injury. Normal range of motion.     Cervical back: Normal range of motion and neck supple. No rigidity.      Right lower leg: No edema.     Left lower leg: No edema.  Skin:    General: Skin is warm and dry.     Capillary Refill: Capillary refill takes less than 2 seconds.     Coloration: Skin is not jaundiced or pale.  Neurological:     General: No focal deficit present.     Mental Status: She is alert and oriented to person, place, and time.     Sensory: No sensory deficit.     Motor: No weakness.  Psychiatric:        Mood and Affect: Mood normal.        Behavior: Behavior normal.    ED Results / Procedures / Treatments   Labs (all labs ordered are listed, but only abnormal results are displayed) Labs Reviewed - No data to display  EKG None  Radiology DG Knee Complete 4 Views Right  Result Date: 05/03/2021 CLINICAL DATA:  Right hip/knee pain EXAM: DG HIP (WITH OR WITHOUT PELVIS) 3V RIGHT; RIGHT KNEE - COMPLETE 4 VIEW COMPARISON:  None. FINDINGS: Right hip: Normal alignment. No acute fracture. Mild degenerative changes of the bilateral hips. Normal mineralization. The soft tissues are unremarkable. Right knee: Normal alignment. No acute fracture. Mild tricompartmental degenerative changes. Minimal chondrocalcinosis of the knee joint, likely age related. The soft tissues are unremarkable. Fabella noted. No joint effusion. IMPRESSION: Right hip, right knee: Mild degenerative changes of the hips and knees. No malalignment or acute fracture. Electronically Signed   By: Albin Felling M.D.   On: 05/03/2021 09:58   DG HIP UNILAT WITH PELVIS 2-3 VIEWS RIGHT  Result Date: 05/03/2021 CLINICAL DATA:  Right hip/knee pain EXAM: DG HIP (WITH OR WITHOUT PELVIS) 3V  RIGHT; RIGHT KNEE - COMPLETE 4 VIEW COMPARISON:  None. FINDINGS: Right hip: Normal alignment. No acute fracture. Mild degenerative changes of the bilateral hips. Normal mineralization. The soft tissues are unremarkable. Right knee: Normal alignment. No acute fracture. Mild tricompartmental degenerative changes. Minimal chondrocalcinosis of  the knee joint, likely age related. The soft tissues are unremarkable. Fabella noted. No joint effusion. IMPRESSION: Right hip, right knee: Mild degenerative changes of the hips and knees. No malalignment or acute fracture. Electronically Signed   By: Albin Felling M.D.   On: 05/03/2021 09:58    Procedures Procedures   Medications Ordered in ED Medications  HYDROcodone-acetaminophen (NORCO/VICODIN) 5-325 MG per tablet 1 tablet (1 tablet Oral Given 05/03/21 0935)  methocarbamol (ROBAXIN) tablet 500 mg (500 mg Oral Given 05/03/21 0935)    ED Course  I have reviewed the triage vital signs and the nursing notes.  Pertinent labs & imaging results that were available during my care of the patient were reviewed by me and considered in my medical decision making (see chart for details).    MDM Rules/Calculators/A&P                          Patient presents for acute on chronic right hip pain.  She takes Mobic daily, including early this morning.  She denies any recent injuries.  She does have a history of breast cancer.  Pain is located in the lateral aspect of her right hip as well as her right knee.  These areas are tender on exam.  She has no neurologic deficits on exam.  X-ray imaging was ordered to identify any bony abnormalities.  Results of these x-ray images were negative for acute findings.  Patient was given Norco and Robaxin for pain relief.  On reassessment, patient reported that her pain was almost completely resolved.  She was able to ambulate without difficulty.  She was given prescription for Robaxin to be taken as needed.  She was advised to continue her Mobic and to follow-up with family medicine/sports medicine.  She was discharged in good condition.  Final Clinical Impression(s) / ED Diagnoses Final diagnoses:  Right hip pain    Rx / DC Orders ED Discharge Orders          Ordered    methocarbamol (ROBAXIN) 500 MG tablet  2 times daily PRN        05/03/21 1205              Godfrey Pick, MD 05/03/21 2348

## 2021-05-07 ENCOUNTER — Encounter: Payer: Self-pay | Admitting: Family Medicine

## 2021-05-07 ENCOUNTER — Ambulatory Visit (INDEPENDENT_AMBULATORY_CARE_PROVIDER_SITE_OTHER): Payer: Medicare Other | Admitting: Family Medicine

## 2021-05-07 VITALS — Ht 66.0 in | Wt 176.0 lb

## 2021-05-07 DIAGNOSIS — M25551 Pain in right hip: Secondary | ICD-10-CM | POA: Insufficient documentation

## 2021-05-07 MED ORDER — DICLOFENAC SODIUM 1 % EX GEL: 4.0000 g | Freq: Four times a day (QID) | CUTANEOUS | 11 refills | Status: DC

## 2021-05-07 NOTE — Assessment & Plan Note (Signed)
Symptoms most consistent with trochanteric pain. -Counseled on home exercise therapy and supportive care. -Voltaren. -Referral to physical therapy. -Could consider injection

## 2021-05-07 NOTE — Progress Notes (Signed)
Jillian Chandler - 70 y.o. female MRN [id number]  Date of birth: [birth year]  SUBJECTIVE:  Including CC & ROS.  No chief complaint on file.   Jillian Chandler is a 70 y.o. female that is presenting with right hip pain.  The pain is acute in nature.  Has a history of similar pain and has gotten some relief here recently..  Independent review of the right hip x-ray from 10/1 shows no acute changes.  Independent review of the right knee x-ray from 10/1 shows chondrocalcinosis   Review of Systems See HPI   HISTORY: Past Medical, Surgical, Social, and Family History Reviewed & Updated per EMR.   Pertinent Historical Findings include:  Past Medical History:  Diagnosis Date   Arthritis    Breast cancer ([place]) 01/2009   left brest- status post mastectomy, intolerant to Arimidex, declined tamoxfien   Gastric ulcer    CLO (-) per EGD ~ 11-2011   Hyperlipidemia    Hypertension 2006    Past Surgical History:  Procedure Laterality Date   BREAST SURGERY     COLONOSCOPY W/ POLYPECTOMY  11/2011   MASTECTOMY Left 04/2009   MASTECTOMY Left    ROBOTIC ASSISTED BILATERAL SALPINGO OOPHERECTOMY Bilateral 12/09/2017   Procedure: XI ROBOTIC ASSISTED BILATERAL SALPINGO OOPHORECTOMY;  Surgeon: Isabel Caprice, MD;  Location: [facility];  Service: Gynecology;  Laterality: Bilateral;    Family History  Problem Relation Age of Onset   Heart attack Father 29       F age 32, M age 72s   Heart disease Father    Diabetes Sister    Diabetes Brother    Diabetes Sister    Heart disease Mother    Diabetes Maternal Aunt    Diabetes Maternal Uncle    Diabetes Paternal Aunt    Diabetes Paternal Uncle    Diabetes Maternal Grandmother    Diabetes Maternal Grandfather    Diabetes Paternal Grandmother    Diabetes Paternal Grandfather    Stroke Neg Hx    Colon cancer Neg Hx    Breast cancer Neg Hx     Social History   Socioeconomic History   Marital status: Married    Spouse name: Not on file   Number of  children: 4   Years of education: Not on file   Highest education level: Not on file  Occupational History   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  Tobacco Use   Smoking status: Former    Types: Cigarettes    Quit date: 02/17/2007    Years since quitting: 14.2   Smokeless tobacco: Never  Vaping Use   Vaping Use: Never used  Substance and Sexual Activity   Alcohol use: No   Drug use: No   Sexual activity: Not on file  Other Topics Concern   Not on file  Social History Narrative   Original from [place] ---   Diet: very  healthy   Exercise- not frequently but active at work               Social Determinants of Radio broadcast assistant Strain: Not on file  Food Insecurity: Not on file  Transportation Needs: Not on file  Physical Activity: Not on file  Stress: Not on file  Social Connections: Not on file  Intimate Partner Violence: Not on file     PHYSICAL EXAM:  VS: Ht 5\' 6"  (1.676 m)   Wt 176 lb (79.8 kg)   BMI 28.41 kg/m  Physical Exam Gen: NAD, alert, cooperative with exam, well-appearing      ASSESSMENT & PLAN:   Greater trochanteric pain syndrome of right lower extremity Symptoms most consistent with trochanteric pain. -Counseled on home exercise therapy and supportive care. -Voltaren. -Referral to physical therapy. -Could consider injection

## 2021-05-07 NOTE — Patient Instructions (Signed)
Nice to meet you Please alternate heat and ice on the area  Please try the rub on medicine  Please try physical therapy  Please try the exercises   Please send me a message in MyChart with any questions or updates.  Please see me back in 4 weeks.   --Dr. Carlynn Herald de conocerte [place] el calor y el hielo en el rea. Por favor, intente frotar la medicina Por favor, prueba la fisioterapia. Por favor, prueba los ejercicios. Enveme un mensaje en MyChart con cualquier pregunta o actualizacin. Por favor, vuelve a verme en 4 semanas.

## 2021-05-27 ENCOUNTER — Ambulatory Visit: Payer: Medicare Other | Admitting: Physical Therapy

## 2021-06-03 ENCOUNTER — Ambulatory Visit: Payer: Medicare Other | Attending: Family Medicine | Admitting: Physical Therapy

## 2021-06-03 ENCOUNTER — Encounter: Payer: Self-pay | Admitting: Physical Therapy

## 2021-06-03 ENCOUNTER — Other Ambulatory Visit: Payer: Self-pay

## 2021-06-03 DIAGNOSIS — M62838 Other muscle spasm: Secondary | ICD-10-CM | POA: Diagnosis present

## 2021-06-03 DIAGNOSIS — G8929 Other chronic pain: Secondary | ICD-10-CM | POA: Diagnosis present

## 2021-06-03 DIAGNOSIS — M6281 Muscle weakness (generalized): Secondary | ICD-10-CM

## 2021-06-03 DIAGNOSIS — R2689 Other abnormalities of gait and mobility: Secondary | ICD-10-CM

## 2021-06-03 DIAGNOSIS — M25551 Pain in right hip: Secondary | ICD-10-CM | POA: Diagnosis present

## 2021-06-03 DIAGNOSIS — M25571 Pain in right ankle and joints of right foot: Secondary | ICD-10-CM

## 2021-06-03 DIAGNOSIS — R262 Difficulty in walking, not elsewhere classified: Secondary | ICD-10-CM | POA: Diagnosis present

## 2021-06-03 DIAGNOSIS — M25562 Pain in left knee: Secondary | ICD-10-CM | POA: Insufficient documentation

## 2021-06-03 DIAGNOSIS — M25561 Pain in right knee: Secondary | ICD-10-CM | POA: Diagnosis present

## 2021-06-03 DIAGNOSIS — M25572 Pain in left ankle and joints of left foot: Secondary | ICD-10-CM

## 2021-06-03 NOTE — Therapy (Signed)
[facility] [address]  [place], [place], [address] Phone: [phone]   Fax:  [phone]  Physical Therapy Evaluation  Patient Details  Name: Jillian Chandler MRN: [id number] Date of Birth: May 13, 1951 Referring Provider (PT): Rosemarie Ax, MD   Encounter Date: 06/03/2021   PT End of Session - 06/03/21 1446     Visit Number 1    Number of Visits 16    Date for PT Re-Evaluation 07/29/21    Authorization Type UHC Medicare & Medicaid QMB    PT Start Time 1446    PT Stop Time 1532    PT Time Calculation (min) 46 min    Activity Tolerance Patient limited by pain    Behavior During Therapy [facility] for tasks assessed/performed             Past Medical History:  Diagnosis Date   Arthritis    Breast cancer ([place]) 01/2009   left brest- status post mastectomy, intolerant to Arimidex, declined tamoxfien   Gastric ulcer    CLO (-) per EGD ~ 11-2011   Hyperlipidemia    Hypertension 2006    Past Surgical History:  Procedure Laterality Date   BREAST SURGERY     COLONOSCOPY W/ POLYPECTOMY  11/2011   MASTECTOMY Left 04/2009   MASTECTOMY Left    ROBOTIC ASSISTED BILATERAL SALPINGO OOPHERECTOMY Bilateral 12/09/2017   Procedure: XI ROBOTIC ASSISTED BILATERAL SALPINGO OOPHORECTOMY;  Surgeon: Isabel Caprice, MD;  Location: [facility];  Service: Gynecology;  Laterality: Bilateral;    There were no vitals filed for this visit.    Subjective Assessment - 06/03/21 1451     Subjective Pt reports 1 month ago she started having pain going from her back down to her R knee. This is a pain that she has had for a long time before but much more flared up starting 1 month ago w/o known MOI. She tried taking some meloxicam she had at home but Dr. Raeford Razor prescribed topcial analgesic which seems to help. Pain limits walking at times. Pain currently in R hip, B knees and ankles.    Patient is accompained by: Interpreter    Diagnostic tests  05/03/21 - R hip x-ray: Mild degenerative changes of the hips and knees. No malalignment or acute fracture.    Patient Stated Goals "not having any more pain"    Currently in Pain? Yes    Pain Score 1     Pain Location Hip    Pain Orientation Right;Lateral;Posterior    Pain Descriptors / Indicators Sharp;Stabbing    Pain Type Acute pain    Pain Radiating Towards down R LE to knee and ankle    Pain Onset More than a month ago    Pain Frequency Intermittent    Aggravating Factors  nothing specific; standing for a long time make distal pain worse    Pain Relieving Factors topical pain meds    Effect of Pain on Daily Activities pain limits standing and laying down; walking limited at times    Pain Score 0   up to 5/10   Pain Location Knee   & ankles   Pain Orientation Anterior    Pain Descriptors / Indicators Sharp;Stabbing    Pain Type Acute pain    Pain Frequency Intermittent                [facility] PT Assessment - 06/03/21 1446       Assessment   Medical  Diagnosis R hip & B knee pain - R greater trochanteric pain syndrome    Referring Provider (PT) Rosemarie Ax, MD    Onset Date/Surgical Date 05/03/21   acute on chronic   Hand Dominance Left    Next MD Visit 06/04/21    Prior Therapy none for current problem; h/o PT for R shoulder      Precautions   Precautions None      Restrictions   Weight Bearing Restrictions No      Balance Screen   Has the patient fallen in the past 6 months No    Has the patient had a decrease in activity level because of a fear of falling?  No    Is the patient reluctant to leave their home because of a fear of falling?  No      Home Environment   Living Environment Private residence    Living Arrangements Spouse/significant other    Type of [place] Access Level entry    [place] One level      Prior Function   Level of [place] Retired    Leisure cooking, cleaning, walking 1 hr every morning       Observation/Other Assessments   Focus on Therapeutic Outcomes (FOTO)  Hip = 84 (risk adjusted = 49); predicted D/C FS = 79      ROM / Strength   AROM / PROM / Strength AROM;Strength      AROM   Overall AROM  Deficits;Due to pain    Overall AROM Comments unable to fully assess R hip ROM due pain but appears limited in all planes      Strength   Overall Strength Deficits;Due to pain    Overall Strength Comments pain with all resisted motions in LE and limiting positional tolerance for testing    Strength Assessment Site Hip;Knee;Ankle    Right/Left Hip Right;Left    Right Hip Flexion 4/5    Right Hip ABduction 4/5    Right Hip ADduction 4/5    Left Hip Flexion 4/5    Left Hip ABduction 4/5    Left Hip ADduction 4/5    Right/Left Knee Right;Left    Right Knee Flexion 4/5    Right Knee Extension 4/5    Left Knee Flexion 4/5    Left Knee Extension 4/5    Right/Left Ankle Right;Left    Right Ankle Dorsiflexion 4/5    Left Ankle Dorsiflexion 4/5      Flexibility   Soft Tissue Assessment /Muscle Length yes   PROM/flexibility assessment limited by pain   Hamstrings mild tight B    ITB mod tight B    Piriformis severe tight B      Palpation   SI assessment  alignment appears WNL    Palpation comment increased muscel tension and TTP in L glutes and ITB                        Objective measurements completed on examination: See above findings.                PT Education - 06/03/21 1530     Education Details PT eval findings and anticipated POC    Person(s) Educated Patient              PT Short Term Goals - 06/03/21 1532       PT SHORT TERM GOAL #  1   Title Patient will be independent with initial HEP    Status New    Target Date 06/24/21               PT Long Term Goals - 06/03/21 1532       PT LONG TERM GOAL #1   Title Patient will be independent with ongoing/advanced HEP for self-management at home    Status New     Target Date 07/29/21      PT LONG TERM GOAL #2   Title Patient to demonstrate improved tissue quality and pliability as noted by reduced tissue tightness, TTP and guarding with hip, knee or ankle ROM    Status New    Target Date 07/29/21      PT LONG TERM GOAL #3   Title Patient will demonstrate improved B LE strength to >/= 4+/5 for improved stability and ease of mobility    Status New    Target Date 07/29/21      PT LONG TERM GOAL #4   Title Patient will report ability to resume walking for exercise w/o limitation due to R hip, B knee or ankle pain or weakness    Status New    Target Date 07/29/21      PT LONG TERM GOAL #5   Title Patient to report ability to perform ADLs, household, and leisure activities without limitation due to R hip pain, LOM or weakness    Status New    Target Date 07/29/21                    Plan - 06/03/21 1532     Clinical Impression Statement Jillian Chandler is a 70 y/o female who presents to OP PT for acute on chronic R hip pain exacerbated 1 month ago w/o known MOI. Via interpreter, she reports h/o prior injections in her hip for similar pain. Pain initially very severe and extending from her back to her R knee but now more localized to R glutes and lateral hip, with pain also reported in B knees and ankles. She reports pain relief from Voltaren prescribed by MD allowing for improving mobility and walking tolerance, however pain significantly limiting PT assessment today with PROM/flexibility and MMT assessment as well as limiting positional tolerance for testing. Deficits include intermittent R hip pain with TTP over R glutes, GT and ITB, B knee and ankle pain, limited lumbar and R>L hip ROM, decreased proximal LE flexibility and mild proximal LE weakness. Pain limits walking and positional tolerance with laying down at times. Jillian Chandler will benefit from skilled PT to address above deficits to improve proximal LE flexibility and decrease abnormal muscle tension, as  well as improve strength for better stability and to reduce B hip pain for increased activity tolerance. Attempted to provide initial HEP for gentle stretching but poorly tolerated d/t pain.    Personal Factors and Comorbidities Time since onset of injury/illness/exacerbation;Past/Current Experience;Age;Comorbidity 3+    Comorbidities Breast cancer s/p L mastectomy, DM, back pain, HTN, OA, DJD, osteoporosis, diverticulitis    Examination-Activity Limitations Bed Mobility;Sleep;Transfers;Sit;Stand;Locomotion Level    Examination-Participation Restrictions Cleaning;Community Activity;Shop    Stability/Clinical Decision Making Evolving/Moderate complexity    Clinical Decision Making Moderate    Rehab Potential Fair    PT Frequency 2x / week    PT Duration 8 weeks    PT Treatment/Interventions ADLs/Self Care Home Management;Cryotherapy;Electrical Stimulation;Iontophoresis 4mg /ml Dexamethasone;Moist Heat;Ultrasound;Gait training;Stair training;Functional mobility training;Therapeutic activities;Therapeutic exercise;Balance training;Neuromuscular re-education;Patient/family education;Manual techniques;Passive range of motion;Dry  needling;Taping;Vasopneumatic Device;Spinal Manipulations;Joint Manipulations    PT Next Visit Plan Establish initial HEP for gentle LE stretching and strengthening as tolerated; MT and modalities PRN for pain    Consulted and Agree with Plan of Care Patient             Patient will benefit from skilled therapeutic intervention in order to improve the following deficits and impairments:  Decreased activity tolerance, Decreased mobility, Decreased range of motion, Decreased strength, Difficulty walking, Hypomobility, Increased fascial restricitons, Increased muscle spasms, Impaired perceived functional ability, Impaired flexibility, Pain  Visit Diagnosis: Pain in right hip  Chronic pain of right knee  Chronic pain of left knee  Pain in right ankle and joints of right  foot  Pain in left ankle and joints of left foot  Other muscle spasm  Muscle weakness (generalized)  Other abnormalities of gait and mobility  Difficulty in walking, not elsewhere classified     Problem List Patient Active Problem List   Diagnosis Date Noted   Greater trochanteric pain syndrome of right lower extremity 05/07/2021   Left lateral epicondylitis 02/28/2017   Left shoulder pain 02/28/2017   Bilateral knee pain 02/25/2017   Breast cancer, left breast ([place]) 10/02/2013   Hyperlipidemia 07/26/2013   Chest pain with moderate risk for cardiac etiology 07/06/2013   Headache(784.0) 10/03/2012   Menopause 02/17/2012   Diabetes mellitus ([place]) 01/25/2012   PUD (peptic ulcer disease) 01/25/2012   Diverticulitis 07/20/2011   General medical examination 06/18/2011   Back pain 11/25/2010   DEGENERATIVE JOINT DISEASE 01/21/2010   Essential hypertension 08/12/2009   BREAST CANCER, HX OF 08/12/2009    Percival Spanish, PT 06/03/2021, 7:22 PM  [facility] [address]  [place], [place], [address] Phone: [phone]   Fax:  [phone]  Name: Jillian Chandler MRN: [id number] Date of Birth: 10-20-1950

## 2021-06-04 ENCOUNTER — Ambulatory Visit (INDEPENDENT_AMBULATORY_CARE_PROVIDER_SITE_OTHER): Payer: Medicare Other | Admitting: Family Medicine

## 2021-06-04 ENCOUNTER — Encounter: Payer: Self-pay | Admitting: Family Medicine

## 2021-06-04 DIAGNOSIS — M25551 Pain in right hip: Secondary | ICD-10-CM

## 2021-06-04 NOTE — Assessment & Plan Note (Signed)
Significant improvement with no pain today. -Counseled on home exercise therapy and supportive care. -Continue physical therapy

## 2021-06-04 NOTE — Progress Notes (Signed)
Jillian Chandler - 70 y.o. female MRN [id number]  Date of birth: 09-17-1950  SUBJECTIVE:  Including CC & ROS.  No chief complaint on file.   Jillian Chandler is a 70 y.o. female that is following up for her leg pain.  She has done well and denies any pain since being treated in physical therapy.  Spanish interpreter was used for this interview  Review of Systems See HPI   HISTORY: Past Medical, Surgical, Social, and Family History Reviewed & Updated per EMR.   Pertinent Historical Findings include:  Past Medical History:  Diagnosis Date   Arthritis    Breast cancer ([place]) 01/2009   left brest- status post mastectomy, intolerant to Arimidex, declined tamoxfien   Gastric ulcer    CLO (-) per EGD ~ 11-2011   Hyperlipidemia    Hypertension 2006    Past Surgical History:  Procedure Laterality Date   BREAST SURGERY     COLONOSCOPY W/ POLYPECTOMY  11/2011   MASTECTOMY Left 04/2009   MASTECTOMY Left    ROBOTIC ASSISTED BILATERAL SALPINGO OOPHERECTOMY Bilateral 12/09/2017   Procedure: XI ROBOTIC ASSISTED BILATERAL SALPINGO OOPHORECTOMY;  Surgeon: Isabel Caprice, MD;  Location: [facility];  Service: Gynecology;  Laterality: Bilateral;    Family History  Problem Relation Age of Onset   Heart attack Father 25       F age 6, M age 44s   Heart disease Father    Diabetes Sister    Diabetes Brother    Diabetes Sister    Heart disease Mother    Diabetes Maternal Aunt    Diabetes Maternal Uncle    Diabetes Paternal Aunt    Diabetes Paternal Uncle    Diabetes Maternal Grandmother    Diabetes Maternal Grandfather    Diabetes Paternal Grandmother    Diabetes Paternal Grandfather    Stroke Neg Hx    Colon cancer Neg Hx    Breast cancer Neg Hx     Social History   Socioeconomic History   Marital status: Married    Spouse name: Not on file   Number of children: 4   Years of education: Not on file   Highest education level: Not on file  Occupational History   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  Tobacco Use   Smoking status: Former    Types: Cigarettes    Quit date: 02/17/2007    Years since quitting: 14.3   Smokeless tobacco: Never  Vaping Use   Vaping Use: Never used  Substance and Sexual Activity   Alcohol use: No   Drug use: No   Sexual activity: Not on file  Other Topics Concern   Not on file  Social History Narrative   Original from [place] ---   Diet: very  healthy   Exercise- not frequently but active at work               Social Determinants of Radio broadcast assistant Strain: Not on file  Food Insecurity: Not on file  Transportation Needs: Not on file  Physical Activity: Not on file  Stress: Not on file  Social Connections: Not on file  Intimate Partner Violence: Not on file     PHYSICAL EXAM:  VS: BP 140/84 (BP Location: Right Arm, Patient Position: Sitting)   Ht 5\' 6"  (1.676 m)   Wt 176 lb (79.8 kg)   BMI 28.41 kg/m  Physical Exam Gen: NAD, alert, cooperative with exam, well-appearing  ASSESSMENT & PLAN:   Greater trochanteric pain syndrome of right lower extremity Significant improvement with no pain today. -Counseled on home exercise therapy and supportive care. -Continue physical therapy

## 2021-06-10 ENCOUNTER — Other Ambulatory Visit: Payer: Self-pay

## 2021-06-10 ENCOUNTER — Encounter: Payer: Self-pay | Admitting: Physical Therapy

## 2021-06-10 ENCOUNTER — Ambulatory Visit: Payer: Medicare Other | Admitting: Physical Therapy

## 2021-06-10 DIAGNOSIS — R262 Difficulty in walking, not elsewhere classified: Secondary | ICD-10-CM

## 2021-06-10 DIAGNOSIS — M25571 Pain in right ankle and joints of right foot: Secondary | ICD-10-CM

## 2021-06-10 DIAGNOSIS — M6281 Muscle weakness (generalized): Secondary | ICD-10-CM

## 2021-06-10 DIAGNOSIS — M25551 Pain in right hip: Secondary | ICD-10-CM | POA: Diagnosis not present

## 2021-06-10 DIAGNOSIS — M25572 Pain in left ankle and joints of left foot: Secondary | ICD-10-CM

## 2021-06-10 DIAGNOSIS — G8929 Other chronic pain: Secondary | ICD-10-CM

## 2021-06-10 DIAGNOSIS — M62838 Other muscle spasm: Secondary | ICD-10-CM

## 2021-06-10 DIAGNOSIS — R2689 Other abnormalities of gait and mobility: Secondary | ICD-10-CM

## 2021-06-10 NOTE — Therapy (Signed)
[facility] [address]  [place], [place], [address] Phone: [phone]   Fax:  [phone]  Physical Therapy Treatment  Patient Details  Name: Jillian Chandler MRN: [id number] Date of Birth: 1951-01-12 Referring Provider (PT): Rosemarie Ax, MD   Encounter Date: 06/10/2021   PT End of Session - 06/10/21 1523     Visit Number 2    Number of Visits 16    Date for PT Re-Evaluation 07/29/21    Authorization Type UHC Medicare & Medicaid QMB    PT Start Time 1523    PT Stop Time 1610    PT Time Calculation (min) 47 min    Activity Tolerance Patient limited by pain    Behavior During Therapy [facility] for tasks assessed/performed             Past Medical History:  Diagnosis Date   Arthritis    Breast cancer ([place]) 01/2009   left brest- status post mastectomy, intolerant to Arimidex, declined tamoxfien   Gastric ulcer    CLO (-) per EGD ~ 11-2011   Hyperlipidemia    Hypertension 2006    Past Surgical History:  Procedure Laterality Date   BREAST SURGERY     COLONOSCOPY W/ POLYPECTOMY  11/2011   MASTECTOMY Left 04/2009   MASTECTOMY Left    ROBOTIC ASSISTED BILATERAL SALPINGO OOPHERECTOMY Bilateral 12/09/2017   Procedure: XI ROBOTIC ASSISTED BILATERAL SALPINGO OOPHORECTOMY;  Surgeon: Isabel Caprice, MD;  Location: [facility];  Service: Gynecology;  Laterality: Bilateral;    There were no vitals filed for this visit.   Subjective Assessment - 06/10/21 1528     Subjective Pt reports in the mornings when she walks her knees hurt but no pain now, just tight behind her knees    Patient is accompained by: Interpreter    Diagnostic tests 05/03/21 - R hip x-ray: Mild degenerative changes of the hips and knees. No malalignment or acute fracture.    Patient Stated Goals "not having any more pain"    Currently in Pain? No/denies                               [facility] Adult PT Treatment/Exercise - 06/10/21  1523       Exercises   Exercises Knee/Hip      Lumbar Exercises: Stretches   Single Knee to Chest Stretch Right;Left;2 reps;30 seconds    Single Knee to Chest Stretch Limitations opp knee flexed      Lumbar Exercises: Supine   Clam 10 reps;3 seconds    Clam Limitations hooklying red TB hip bent knee fall-out      Knee/Hip Exercises: Stretches   Passive Hamstring Stretch Right;Left;2 reps;30 seconds    Passive Hamstring Stretch Limitations hooklying with strap    ITB Stretch Right;Left;2 reps;30 seconds    ITB Stretch Limitations supine crossbody with strap    Piriformis Stretch Right;Left;2 reps;30 seconds    Piriformis Stretch Limitations hooklying KTOS      Knee/Hip Exercises: Aerobic   Nustep L4 x 6 min (UE/LE)      Knee/Hip Exercises: Supine   Hip Adduction Isometric Both;10 reps   5 sec hold   Hip Adduction Isometric Limitations hooklying ball squeeze + TrA    Bridges Both;10 reps;Strengthening   5 sec hold   Bridges Limitations + red TB hip ABD isometric  PT Education - 06/10/21 1610     Education Details Initial HEP - Access Code: [email or web address]    Person(s) Educated Patient    Methods Explanation;Demonstration;Verbal cues;Tactile cues;Handout    Comprehension Verbalized understanding;Verbal cues required;Tactile cues required;Returned demonstration;Need further instruction              PT Short Term Goals - 06/10/21 1523       PT SHORT TERM GOAL #1   Title Patient will be independent with initial HEP    Status On-going    Target Date 06/24/21               PT Long Term Goals - 06/10/21 1524       PT LONG TERM GOAL #1   Title Patient will be independent with ongoing/advanced HEP for self-management at home    Status On-going    Target Date 07/29/21      PT LONG TERM GOAL #2   Title Patient to demonstrate improved tissue quality and pliability as noted by reduced tissue tightness, TTP and guarding with hip, knee or  ankle ROM    Status On-going    Target Date 07/29/21      PT LONG TERM GOAL #3   Title Patient will demonstrate improved B LE strength to >/= 4+/5 for improved stability and ease of mobility    Status On-going    Target Date 07/29/21      PT LONG TERM GOAL #4   Title Patient will report ability to resume walking for exercise w/o limitation due to R hip, B knee or ankle pain or weakness    Status On-going    Target Date 07/29/21      PT LONG TERM GOAL #5   Title Patient to report ability to perform ADLs, household, and leisure activities without limitation due to R hip pain, LOM or weakness    Status On-going    Target Date 07/29/21                   Plan - 06/10/21 1529     Clinical Impression Statement Nirel reports more pain in the mornings but now only tightness. Initiated instruction in stretching to address areas of tightness identified on eval. Pt initially having some increased pain and muscle guarding but able to work with her to decrease intensity of stretches and rework order of stretches to allow for better tolerance with pt noting good relief of pain and improving muscle tension. Initiated gentle lumbopelvic strengthening with good tolerance and no increased pain reported. Initial HEP provided with stretches and strengthening exercises ordered to allow for best tolerance at home.    Comorbidities Breast cancer s/p L mastectomy, DM, back pain, HTN, OA, DJD, osteoporosis, diverticulitis    Rehab Potential Fair    PT Frequency 2x / week    PT Duration 8 weeks    PT Treatment/Interventions ADLs/Self Care Home Management;Cryotherapy;Electrical Stimulation;Iontophoresis 4mg /ml Dexamethasone;Moist Heat;Ultrasound;Gait training;Stair training;Functional mobility training;Therapeutic activities;Therapeutic exercise;Balance training;Neuromuscular re-education;Patient/family education;Manual techniques;Passive range of motion;Dry needling;Taping;Vasopneumatic Device;Spinal  Manipulations;Joint Manipulations    PT Next Visit Plan Review initial HEP; progress gentle LE stretching and strengthening as tolerated; MT and modalities PRN for pain    PT Home Exercise Plan Access Code: [email or web address] (11/8)    Consulted and Agree with Plan of Care Patient             Patient will benefit from skilled therapeutic intervention in order to improve the following deficits and impairments:  Decreased activity tolerance,  Decreased mobility, Decreased range of motion, Decreased strength, Difficulty walking, Hypomobility, Increased fascial restricitons, Increased muscle spasms, Impaired perceived functional ability, Impaired flexibility, Pain  Visit Diagnosis: Pain in right hip  Chronic pain of right knee  Chronic pain of left knee  Pain in right ankle and joints of right foot  Pain in left ankle and joints of left foot  Other muscle spasm  Muscle weakness (generalized)  Other abnormalities of gait and mobility  Difficulty in walking, not elsewhere classified     Problem List Patient Active Problem List   Diagnosis Date Noted   Greater trochanteric pain syndrome of right lower extremity 05/07/2021   Left lateral epicondylitis 02/28/2017   Left shoulder pain 02/28/2017   Bilateral knee pain 02/25/2017   Breast cancer, left breast ([place]) 10/02/2013   Hyperlipidemia 07/26/2013   Chest pain with moderate risk for cardiac etiology 07/06/2013   Headache(784.0) 10/03/2012   Menopause 02/17/2012   Diabetes mellitus ([place]) 01/25/2012   PUD (peptic ulcer disease) 01/25/2012   Diverticulitis 07/20/2011   General medical examination 06/18/2011   Back pain 11/25/2010   DEGENERATIVE JOINT DISEASE 01/21/2010   Essential hypertension 08/12/2009   BREAST CANCER, HX OF 08/12/2009    Percival Spanish, PT 06/10/2021, 4:28 PM  [facility] [address]  [place], [place], [address] Phone: [phone]   Fax:   [phone]  Name: Terryann Verbeek MRN: [id number] Date of Birth: 1950-09-18

## 2021-06-10 NOTE — Patient Instructions (Signed)
    Access Code: [email or web address] URL: [email or web address] Date: 06/10/2021 Prepared by: Annie Paras  Exercises Hooklying Single Knee to Chest Stretch - 2-3 x daily - 7 x weekly - 3 reps - 30 sec hold Hooklying Hamstring Stretch with Strap - 2-3 x daily - 7 x weekly - 3 reps - 30 sec hold Supine ITB Stretch with Strap - 2-3 x daily - 7 x weekly - 3 reps - 30 sec hold Supine Piriformis Stretch with Foot on Ground - 2-3 x daily - 7 x weekly - 3 reps - 30 sec hold Supine Hip Adduction Isometric with Ball - 1 x daily - 7 x weekly - 2 sets - 10 reps - 5 sec hold Hooklying Isometric Clamshell - 1 x daily - 7 x weekly - 2 sets - 10 reps - 3 sec hold Bridge with Resistance - 1 x daily - 7 x weekly - 2 sets - 10 reps - 5 sec hold

## 2021-06-11 ENCOUNTER — Ambulatory Visit: Payer: Medicare Other

## 2021-06-11 DIAGNOSIS — M6281 Muscle weakness (generalized): Secondary | ICD-10-CM

## 2021-06-11 DIAGNOSIS — M25551 Pain in right hip: Secondary | ICD-10-CM

## 2021-06-11 DIAGNOSIS — M62838 Other muscle spasm: Secondary | ICD-10-CM

## 2021-06-11 DIAGNOSIS — M25562 Pain in left knee: Secondary | ICD-10-CM

## 2021-06-11 DIAGNOSIS — R2689 Other abnormalities of gait and mobility: Secondary | ICD-10-CM

## 2021-06-11 DIAGNOSIS — M25572 Pain in left ankle and joints of left foot: Secondary | ICD-10-CM

## 2021-06-11 DIAGNOSIS — M25571 Pain in right ankle and joints of right foot: Secondary | ICD-10-CM

## 2021-06-11 DIAGNOSIS — M25561 Pain in right knee: Secondary | ICD-10-CM

## 2021-06-11 DIAGNOSIS — G8929 Other chronic pain: Secondary | ICD-10-CM

## 2021-06-11 DIAGNOSIS — R262 Difficulty in walking, not elsewhere classified: Secondary | ICD-10-CM

## 2021-06-11 NOTE — Therapy (Signed)
[facility] [address]  [place], [place], [address] Phone: [phone]   Fax:  [phone]  Physical Therapy Treatment  Patient Details  Name: Jillian Chandler MRN: [id number] Date of Birth: 16-Jan-1951 Referring Provider (PT): Rosemarie Ax, MD   Encounter Date: 06/11/2021   PT End of Session - 06/11/21 1534     Visit Number 3    Number of Visits 16    Date for PT Re-Evaluation 07/29/21    Authorization Type UHC Medicare & Medicaid QMB    PT Start Time 1448    PT Stop Time 1528    PT Time Calculation (min) 40 min    Activity Tolerance Patient tolerated treatment well    Behavior During Therapy [facility] for tasks assessed/performed             Past Medical History:  Diagnosis Date   Arthritis    Breast cancer ([place]) 01/2009   left brest- status post mastectomy, intolerant to Arimidex, declined tamoxfien   Gastric ulcer    CLO (-) per EGD ~ 11-2011   Hyperlipidemia    Hypertension 2006    Past Surgical History:  Procedure Laterality Date   BREAST SURGERY     COLONOSCOPY W/ POLYPECTOMY  11/2011   MASTECTOMY Left 04/2009   MASTECTOMY Left    ROBOTIC ASSISTED BILATERAL SALPINGO OOPHERECTOMY Bilateral 12/09/2017   Procedure: XI ROBOTIC ASSISTED BILATERAL SALPINGO OOPHORECTOMY;  Surgeon: Isabel Caprice, MD;  Location: [facility];  Service: Gynecology;  Laterality: Bilateral;    There were no vitals filed for this visit.   Subjective Assessment - 06/11/21 1450     Subjective Pt reports much improvements from therapy so far. No pain today.    Patient is accompained by: Interpreter    Diagnostic tests 05/03/21 - R hip x-ray: Mild degenerative changes of the hips and knees. No malalignment or acute fracture.    Patient Stated Goals "not having any more pain"    Currently in Pain? No/denies                               [facility] Adult PT Treatment/Exercise - 06/11/21 0001       Lumbar  Exercises: Stretches   Passive Hamstring Stretch Right;Left;30 seconds    Passive Hamstring Stretch Limitations supine with strap    Single Knee to Chest Stretch Right;Left;2 reps;30 seconds    Single Knee to Chest Stretch Limitations opp knee flexed    Lower Trunk Rotation Limitations 10x2" each way    ITB Stretch Right;Left;30 seconds    ITB Stretch Limitations supine with strap    Figure 4 Stretch 2 reps    Figure 4 Stretch Limitations 15 seconds, supine      Lumbar Exercises: Supine   Clam 10 reps;3 seconds    Clam Limitations hooklying red TB hip bent knee fall-out    Bridge 20 reps;3 seconds    Bridge Limitations red TB    Other Supine Lumbar Exercises ball squeezes with PPT 10x5"      Knee/Hip Exercises: Aerobic   Nustep L4 x 6 min (UE/LE)      Knee/Hip Exercises: Seated   Marching Strengthening;Both;10 reps    Marching Limitations with red TB                       PT Short Term Goals - 06/10/21 1523  PT SHORT TERM GOAL #1   Title Patient will be independent with initial HEP    Status On-going    Target Date 06/24/21               PT Long Term Goals - 06/10/21 1524       PT LONG TERM GOAL #1   Title Patient will be independent with ongoing/advanced HEP for self-management at home    Status On-going    Target Date 07/29/21      PT LONG TERM GOAL #2   Title Patient to demonstrate improved tissue quality and pliability as noted by reduced tissue tightness, TTP and guarding with hip, knee or ankle ROM    Status On-going    Target Date 07/29/21      PT LONG TERM GOAL #3   Title Patient will demonstrate improved B LE strength to >/= 4+/5 for improved stability and ease of mobility    Status On-going    Target Date 07/29/21      PT LONG TERM GOAL #4   Title Patient will report ability to resume walking for exercise w/o limitation due to R hip, B knee or ankle pain or weakness    Status On-going    Target Date 07/29/21      PT LONG  TERM GOAL #5   Title Patient to report ability to perform ADLs, household, and leisure activities without limitation due to R hip pain, LOM or weakness    Status On-going    Target Date 07/29/21                   Plan - 06/11/21 1546     Clinical Impression Statement Pt required modification with the ITB stretch to avoid pain. I showed how she can use a belt to substitute for a strap for the HS and ITB stretches. Extra instructions needed with all exercises for understanding due to language barrier. She showed good compliance with the exercises given so far, better tolerance for exercises today with no pain reported except for ITB stretch, which subsided with modification.    Personal Factors and Comorbidities Time since onset of injury/illness/exacerbation;Past/Current Experience;Age;Comorbidity 3+    Comorbidities Breast cancer s/p L mastectomy, DM, back pain, HTN, OA, DJD, osteoporosis, diverticulitis    PT Frequency 2x / week    PT Duration 8 weeks    PT Treatment/Interventions ADLs/Self Care Home Management;Cryotherapy;Electrical Stimulation;Iontophoresis 4mg /ml Dexamethasone;Moist Heat;Ultrasound;Gait training;Stair training;Functional mobility training;Therapeutic activities;Therapeutic exercise;Balance training;Neuromuscular re-education;Patient/family education;Manual techniques;Passive range of motion;Dry needling;Taping;Vasopneumatic Device;Spinal Manipulations;Joint Manipulations    PT Next Visit Plan Review initial HEP; progress gentle LE stretching and strengthening as tolerated; MT and modalities PRN for pain    PT Home Exercise Plan Access Code: [email or web address] (11/8)    Consulted and Agree with Plan of Care Patient             Patient will benefit from skilled therapeutic intervention in order to improve the following deficits and impairments:  Decreased activity tolerance, Decreased mobility, Decreased range of motion, Decreased strength, Difficulty walking, Hypomobility,  Increased fascial restricitons, Increased muscle spasms, Impaired perceived functional ability, Impaired flexibility, Pain  Visit Diagnosis: Pain in right hip  Chronic pain of right knee  Chronic pain of left knee  Pain in right ankle and joints of right foot  Pain in left ankle and joints of left foot  Other muscle spasm  Muscle weakness (generalized)  Other abnormalities of gait and mobility  Difficulty in walking, not elsewhere classified  Problem List Patient Active Problem List   Diagnosis Date Noted   Greater trochanteric pain syndrome of right lower extremity 05/07/2021   Left lateral epicondylitis 02/28/2017   Left shoulder pain 02/28/2017   Bilateral knee pain 02/25/2017   Breast cancer, left breast ([place]) 10/02/2013   Hyperlipidemia 07/26/2013   Chest pain with moderate risk for cardiac etiology 07/06/2013   Headache(784.0) 10/03/2012   Menopause 02/17/2012   Diabetes mellitus ([place]) 01/25/2012   PUD (peptic ulcer disease) 01/25/2012   Diverticulitis 07/20/2011   General medical examination 06/18/2011   Back pain 11/25/2010   DEGENERATIVE JOINT DISEASE 01/21/2010   Essential hypertension 08/12/2009   BREAST CANCER, HX OF 08/12/2009    Artist Pais, PTA 06/11/2021, 4:38 PM  [facility] [address]  [place], [place], [address] Phone: [phone]   Fax:  [phone]  Name: Jillian Chandler MRN: [id number] Date of Birth: 1950/12/10

## 2021-06-16 ENCOUNTER — Ambulatory Visit: Payer: Medicare Other

## 2021-06-16 ENCOUNTER — Other Ambulatory Visit: Payer: Self-pay

## 2021-06-16 DIAGNOSIS — G8929 Other chronic pain: Secondary | ICD-10-CM

## 2021-06-16 DIAGNOSIS — M25572 Pain in left ankle and joints of left foot: Secondary | ICD-10-CM

## 2021-06-16 DIAGNOSIS — M25551 Pain in right hip: Secondary | ICD-10-CM

## 2021-06-16 DIAGNOSIS — M25571 Pain in right ankle and joints of right foot: Secondary | ICD-10-CM

## 2021-06-16 DIAGNOSIS — M25562 Pain in left knee: Secondary | ICD-10-CM

## 2021-06-16 DIAGNOSIS — M6281 Muscle weakness (generalized): Secondary | ICD-10-CM

## 2021-06-16 DIAGNOSIS — M62838 Other muscle spasm: Secondary | ICD-10-CM

## 2021-06-16 DIAGNOSIS — R2689 Other abnormalities of gait and mobility: Secondary | ICD-10-CM

## 2021-06-16 DIAGNOSIS — R262 Difficulty in walking, not elsewhere classified: Secondary | ICD-10-CM

## 2021-06-16 NOTE — Therapy (Signed)
[facility] [address]  [place], [place], [address] Phone: [phone]   Fax:  [phone]  Physical Therapy Treatment  Patient Details  Name: Jillian Chandler MRN: [id number] Date of Birth: 15-Dec-1950 Referring Provider (PT): Rosemarie Ax, MD   Encounter Date: 06/16/2021   PT End of Session - 06/16/21 1538     Visit Number 4    Number of Visits 16    Date for PT Re-Evaluation 07/29/21    Authorization Type UHC Medicare & Medicaid QMB    PT Start Time 1448    PT Stop Time 1529    PT Time Calculation (min) 41 min    Activity Tolerance Patient tolerated treatment well    Behavior During Therapy [facility] for tasks assessed/performed             Past Medical History:  Diagnosis Date   Arthritis    Breast cancer ([place]) 01/2009   left brest- status post mastectomy, intolerant to Arimidex, declined tamoxfien   Gastric ulcer    CLO (-) per EGD ~ 11-2011   Hyperlipidemia    Hypertension 2006    Past Surgical History:  Procedure Laterality Date   BREAST SURGERY     COLONOSCOPY W/ POLYPECTOMY  11/2011   MASTECTOMY Left 04/2009   MASTECTOMY Left    ROBOTIC ASSISTED BILATERAL SALPINGO OOPHERECTOMY Bilateral 12/09/2017   Procedure: XI ROBOTIC ASSISTED BILATERAL SALPINGO OOPHORECTOMY;  Surgeon: Isabel Caprice, MD;  Location: [facility];  Service: Gynecology;  Laterality: Bilateral;    There were no vitals filed for this visit.   Subjective Assessment - 06/16/21 1451     Subjective Pt reports that Friday she was in a lot of pain in her knees, legs, and hands after doing her exercises, she believes it is arthritic pain.    Patient is accompained by: Interpreter    Diagnostic tests 05/03/21 - R hip x-ray: Mild degenerative changes of the hips and knees. No malalignment or acute fracture.    Patient Stated Goals "not having any more pain"    Currently in Pain? No/denies                                [facility] Adult PT Treatment/Exercise - 06/16/21 0001       Exercises   Exercises Knee/Hip      Lumbar Exercises: Stretches   Passive Hamstring Stretch Right;Left;2 reps;10 seconds    Passive Hamstring Stretch Limitations seated hip hinge; limited tolerance for duration due to pain    Figure 4 Stretch 2 reps;30 seconds;Supine;With overpressure      Lumbar Exercises: Supine   Bridge 20 reps;3 seconds    Bridge Limitations red TB      Knee/Hip Exercises: Aerobic   Nustep L4 x 6 min (UE/LE)      Knee/Hip Exercises: Seated   Long Arc Quad AROM;Both;10 reps    [facility] 10x5"    Marching Strengthening;Both;10 reps;2 sets    Marching Limitations with red TB    Sit to Sand 10 reps;without UE support      Knee/Hip Exercises: Sidelying   Clams R/L with red TB 10x                       PT Short Term Goals - 06/10/21 1523       PT SHORT TERM GOAL #1   Title Patient will  be independent with initial HEP    Status On-going    Target Date 06/24/21               PT Long Term Goals - 06/10/21 1524       PT LONG TERM GOAL #1   Title Patient will be independent with ongoing/advanced HEP for self-management at home    Status On-going    Target Date 07/29/21      PT LONG TERM GOAL #2   Title Patient to demonstrate improved tissue quality and pliability as noted by reduced tissue tightness, TTP and guarding with hip, knee or ankle ROM    Status On-going    Target Date 07/29/21      PT LONG TERM GOAL #3   Title Patient will demonstrate improved B LE strength to >/= 4+/5 for improved stability and ease of mobility    Status On-going    Target Date 07/29/21      PT LONG TERM GOAL #4   Title Patient will report ability to resume walking for exercise w/o limitation due to R hip, B knee or ankle pain or weakness    Status On-going    Target Date 07/29/21      PT LONG TERM GOAL #5   Title Patient to report ability to perform  ADLs, household, and leisure activities without limitation due to R hip pain, LOM or weakness    Status On-going    Target Date 07/29/21                   Plan - 06/16/21 1549     Clinical Impression Statement Pt had mild c/o R hip pain with transitioning from sidelying to supine during the mat exercises today. The interventions were well tolerated and instructions given with each exercise for proper form. Cues needed for eccentric control with the LAQ and marches. We were able to progress HS stretch in a seated position but she did have limited tolerance for duration of stretch to 10 seconds. She continues to noted good benefit from PT, despite recent episode of pain during the weekend.    Personal Factors and Comorbidities Time since onset of injury/illness/exacerbation;Past/Current Experience;Age;Comorbidity 3+    Comorbidities Breast cancer s/p L mastectomy, DM, back pain, HTN, OA, DJD, osteoporosis, diverticulitis    PT Frequency 2x / week    PT Duration 8 weeks    PT Treatment/Interventions ADLs/Self Care Home Management;Cryotherapy;Electrical Stimulation;Iontophoresis 4mg /ml Dexamethasone;Moist Heat;Ultrasound;Gait training;Stair training;Functional mobility training;Therapeutic activities;Therapeutic exercise;Balance training;Neuromuscular re-education;Patient/family education;Manual techniques;Passive range of motion;Dry needling;Taping;Vasopneumatic Device;Spinal Manipulations;Joint Manipulations    PT Next Visit Plan progress gentle LE stretching and strengthening as tolerated; MT and modalities PRN for pain    PT Home Exercise Plan Access Code: [email or web address] (11/8)    Consulted and Agree with Plan of Care Patient             Patient will benefit from skilled therapeutic intervention in order to improve the following deficits and impairments:  Decreased activity tolerance, Decreased mobility, Decreased range of motion, Decreased strength, Difficulty walking, Hypomobility,  Increased fascial restricitons, Increased muscle spasms, Impaired perceived functional ability, Impaired flexibility, Pain  Visit Diagnosis: Pain in right hip  Chronic pain of right knee  Chronic pain of left knee  Pain in right ankle and joints of right foot  Pain in left ankle and joints of left foot  Other muscle spasm  Muscle weakness (generalized)  Other abnormalities of gait and mobility  Difficulty in walking, not elsewhere  classified     Problem List Patient Active Problem List   Diagnosis Date Noted   Greater trochanteric pain syndrome of right lower extremity 05/07/2021   Left lateral epicondylitis 02/28/2017   Left shoulder pain 02/28/2017   Bilateral knee pain 02/25/2017   Breast cancer, left breast ([place]) 10/02/2013   Hyperlipidemia 07/26/2013   Chest pain with moderate risk for cardiac etiology 07/06/2013   Headache(784.0) 10/03/2012   Menopause 02/17/2012   Diabetes mellitus ([place]) 01/25/2012   PUD (peptic ulcer disease) 01/25/2012   Diverticulitis 07/20/2011   General medical examination 06/18/2011   Back pain 11/25/2010   DEGENERATIVE JOINT DISEASE 01/21/2010   Essential hypertension 08/12/2009   BREAST CANCER, HX OF 08/12/2009    Artist Pais, PTA 06/16/2021, 3:55 PM  [facility] [address]  [place], [place], [address] Phone: [phone]   Fax:  [phone]  Name: Jillian Chandler MRN: [id number] Date of Birth: 1950/11/07

## 2021-06-19 ENCOUNTER — Ambulatory Visit: Payer: Medicare Other

## 2021-06-23 ENCOUNTER — Ambulatory Visit: Payer: Medicare Other

## 2021-06-23 ENCOUNTER — Other Ambulatory Visit: Payer: Self-pay

## 2021-06-23 DIAGNOSIS — M25551 Pain in right hip: Secondary | ICD-10-CM

## 2021-06-23 DIAGNOSIS — R2689 Other abnormalities of gait and mobility: Secondary | ICD-10-CM

## 2021-06-23 DIAGNOSIS — M6281 Muscle weakness (generalized): Secondary | ICD-10-CM

## 2021-06-23 DIAGNOSIS — M62838 Other muscle spasm: Secondary | ICD-10-CM

## 2021-06-23 DIAGNOSIS — M25572 Pain in left ankle and joints of left foot: Secondary | ICD-10-CM

## 2021-06-23 DIAGNOSIS — R262 Difficulty in walking, not elsewhere classified: Secondary | ICD-10-CM

## 2021-06-23 DIAGNOSIS — G8929 Other chronic pain: Secondary | ICD-10-CM

## 2021-06-23 DIAGNOSIS — M25571 Pain in right ankle and joints of right foot: Secondary | ICD-10-CM

## 2021-06-23 DIAGNOSIS — M25562 Pain in left knee: Secondary | ICD-10-CM

## 2021-06-23 NOTE — Therapy (Signed)
[facility] [address]  [place], [place], [address] Phone: [phone]   Fax:  [phone]  Physical Therapy Treatment  Patient Details  Name: Jillian Chandler MRN: [id number] Date of Birth: September 21, 1950 Referring Provider (PT): Rosemarie Ax, MD   Encounter Date: 06/23/2021   PT End of Session - 06/23/21 1813     Visit Number 5    Number of Visits 16    Date for PT Re-Evaluation 07/29/21    Authorization Type UHC Medicare & Medicaid QMB    PT Start Time 1532    PT Stop Time 1618    PT Time Calculation (min) 46 min    Activity Tolerance Patient tolerated treatment well    Behavior During Therapy [facility] for tasks assessed/performed             Past Medical History:  Diagnosis Date   Arthritis    Breast cancer ([place]) 01/2009   left brest- status post mastectomy, intolerant to Arimidex, declined tamoxfien   Gastric ulcer    CLO (-) per EGD ~ 11-2011   Hyperlipidemia    Hypertension 2006    Past Surgical History:  Procedure Laterality Date   BREAST SURGERY     COLONOSCOPY W/ POLYPECTOMY  11/2011   MASTECTOMY Left 04/2009   MASTECTOMY Left    ROBOTIC ASSISTED BILATERAL SALPINGO OOPHERECTOMY Bilateral 12/09/2017   Procedure: XI ROBOTIC ASSISTED BILATERAL SALPINGO OOPHORECTOMY;  Surgeon: Isabel Caprice, MD;  Location: [facility];  Service: Gynecology;  Laterality: Bilateral;    There were no vitals filed for this visit.   Subjective Assessment - 06/23/21 1536     Subjective Pt reports some improvements in arthritic pain from taking tumeric.    Patient is accompained by: Interpreter    Diagnostic tests 05/03/21 - R hip x-ray: Mild degenerative changes of the hips and knees. No malalignment or acute fracture.    Patient Stated Goals "not having any more pain"    Currently in Pain? Yes    Pain Score 5     Pain Location Hip    Pain Orientation Right;Lateral;Posterior    Pain Descriptors / Indicators Sharp     Pain Type Acute pain                               [facility] Adult PT Treatment/Exercise - 06/23/21 0001       Exercises   Exercises Knee/Hip;Lumbar      Lumbar Exercises: Seated   Sit to Stand 10 reps    Sit to Stand Limitations yellow weight ball    Other Seated Lumbar Exercises seated DL with yellow weight ball 2x10      Knee/Hip Exercises: Standing   Hip Flexion AROM;10 reps    Hip Abduction AROM;Both;10 reps;Knee straight    Hip Extension AROM;Both;10 reps;Knee straight    Functional Squat 10 reps      Manual Therapy   Manual Therapy Soft tissue mobilization;Myofascial release    Soft tissue mobilization STM to R glutes, lumbar paraspinals    Myofascial Release TPR to R piriformis, glute med                     PT Education - 06/23/21 1817     Education Details HEP progression    Person(s) Educated Patient    Methods Explanation;Demonstration;Handout    Comprehension Verbalized understanding;Returned demonstration  PT Short Term Goals - 06/23/21 1815       PT SHORT TERM GOAL #1   Title Patient will be independent with initial HEP    Status Achieved   06/23/21   Target Date 06/24/21               PT Long Term Goals - 06/10/21 1524       PT LONG TERM GOAL #1   Title Patient will be independent with ongoing/advanced HEP for self-management at home    Status On-going    Target Date 07/29/21      PT LONG TERM GOAL #2   Title Patient to demonstrate improved tissue quality and pliability as noted by reduced tissue tightness, TTP and guarding with hip, knee or ankle ROM    Status On-going    Target Date 07/29/21      PT LONG TERM GOAL #3   Title Patient will demonstrate improved B LE strength to >/= 4+/5 for improved stability and ease of mobility    Status On-going    Target Date 07/29/21      PT LONG TERM GOAL #4   Title Patient will report ability to resume walking for exercise w/o limitation due to R  hip, B knee or ankle pain or weakness    Status On-going    Target Date 07/29/21      PT LONG TERM GOAL #5   Title Patient to report ability to perform ADLs, household, and leisure activities without limitation due to R hip pain, LOM or weakness    Status On-going    Target Date 07/29/21                   Plan - 06/23/21 1818     Clinical Impression Statement Pt was able to progression to more standing ther ex today. She had intial c/o LBP but noted a decrease with the standing exercises. Instructions needed still with most exercises for form and technique. Pain was also addressed with STM post session, lots of restrictions found in R glutes and lumbar paraspinals and I also educated her how to self massage at home. She has made good progress so far.    Personal Factors and Comorbidities Time since onset of injury/illness/exacerbation;Past/Current Experience;Age;Comorbidity 3+    Comorbidities Breast cancer s/p L mastectomy, DM, back pain, HTN, OA, DJD, osteoporosis, diverticulitis    PT Frequency 2x / week    PT Duration 8 weeks    PT Treatment/Interventions ADLs/Self Care Home Management;Cryotherapy;Electrical Stimulation;Iontophoresis 4mg /ml Dexamethasone;Moist Heat;Ultrasound;Gait training;Stair training;Functional mobility training;Therapeutic activities;Therapeutic exercise;Balance training;Neuromuscular re-education;Patient/family education;Manual techniques;Passive range of motion;Dry needling;Taping;Vasopneumatic Device;Spinal Manipulations;Joint Manipulations    PT Next Visit Plan standing ther ex, higher level core exercises; MT and modalities PRN for pain    PT Home Exercise Plan Access Code: [email or web address] (11/8)    Consulted and Agree with Plan of Care Patient             Patient will benefit from skilled therapeutic intervention in order to improve the following deficits and impairments:  Decreased activity tolerance, Decreased mobility, Decreased range of motion,  Decreased strength, Difficulty walking, Hypomobility, Increased fascial restricitons, Increased muscle spasms, Impaired perceived functional ability, Impaired flexibility, Pain  Visit Diagnosis: Pain in right hip  Chronic pain of right knee  Chronic pain of left knee  Pain in right ankle and joints of right foot  Pain in left ankle and joints of left foot  Other muscle spasm  Muscle weakness (generalized)  Other abnormalities of gait and mobility  Difficulty in walking, not elsewhere classified     Problem List Patient Active Problem List   Diagnosis Date Noted   Greater trochanteric pain syndrome of right lower extremity 05/07/2021   Left lateral epicondylitis 02/28/2017   Left shoulder pain 02/28/2017   Bilateral knee pain 02/25/2017   Breast cancer, left breast ([place]) 10/02/2013   Hyperlipidemia 07/26/2013   Chest pain with moderate risk for cardiac etiology 07/06/2013   Headache(784.0) 10/03/2012   Menopause 02/17/2012   Diabetes mellitus ([place]) 01/25/2012   PUD (peptic ulcer disease) 01/25/2012   Diverticulitis 07/20/2011   General medical examination 06/18/2011   Back pain 11/25/2010   DEGENERATIVE JOINT DISEASE 01/21/2010   Essential hypertension 08/12/2009   BREAST CANCER, HX OF 08/12/2009    Artist Pais, PTA 06/23/2021, 6:54 PM  [facility] [address]  [place], [place], [address] Phone: [phone]   Fax:  [phone]  Name: Cyleigh Massaro MRN: [id number] Date of Birth: January 28, 1951

## 2021-06-30 ENCOUNTER — Other Ambulatory Visit: Payer: Self-pay

## 2021-06-30 ENCOUNTER — Ambulatory Visit: Payer: Medicare Other

## 2021-06-30 DIAGNOSIS — M25551 Pain in right hip: Secondary | ICD-10-CM

## 2021-06-30 DIAGNOSIS — R262 Difficulty in walking, not elsewhere classified: Secondary | ICD-10-CM

## 2021-06-30 DIAGNOSIS — M6281 Muscle weakness (generalized): Secondary | ICD-10-CM

## 2021-06-30 DIAGNOSIS — M25561 Pain in right knee: Secondary | ICD-10-CM

## 2021-06-30 DIAGNOSIS — M62838 Other muscle spasm: Secondary | ICD-10-CM

## 2021-06-30 DIAGNOSIS — R2689 Other abnormalities of gait and mobility: Secondary | ICD-10-CM

## 2021-06-30 DIAGNOSIS — M25571 Pain in right ankle and joints of right foot: Secondary | ICD-10-CM

## 2021-06-30 DIAGNOSIS — M25572 Pain in left ankle and joints of left foot: Secondary | ICD-10-CM

## 2021-06-30 DIAGNOSIS — M25562 Pain in left knee: Secondary | ICD-10-CM

## 2021-06-30 NOTE — Therapy (Signed)
[facility] [address]  [place], [place], [address] Phone: [phone]   Fax:  [phone]  Physical Therapy Treatment  Patient Details  Name: Jillian Chandler MRN: [id number] Date of Birth: 1951-05-28 Referring Provider (PT): Rosemarie Ax, MD   Encounter Date: 06/30/2021   PT End of Session - 06/30/21 1748     Visit Number 6    Number of Visits 16    Date for PT Re-Evaluation 07/29/21    Authorization Type UHC Medicare & Medicaid QMB    PT Start Time 1533    PT Stop Time 1615    PT Time Calculation (min) 42 min    Activity Tolerance Patient tolerated treatment well;Patient limited by pain    Behavior During Therapy [facility] for tasks assessed/performed             Past Medical History:  Diagnosis Date   Arthritis    Breast cancer ([place]) 01/2009   left brest- status post mastectomy, intolerant to Arimidex, declined tamoxfien   Gastric ulcer    CLO (-) per EGD ~ 11-2011   Hyperlipidemia    Hypertension 2006    Past Surgical History:  Procedure Laterality Date   BREAST SURGERY     COLONOSCOPY W/ POLYPECTOMY  11/2011   MASTECTOMY Left 04/2009   MASTECTOMY Left    ROBOTIC ASSISTED BILATERAL SALPINGO OOPHERECTOMY Bilateral 12/09/2017   Procedure: XI ROBOTIC ASSISTED BILATERAL SALPINGO OOPHORECTOMY;  Surgeon: Isabel Caprice, MD;  Location: [facility];  Service: Gynecology;  Laterality: Bilateral;    There were no vitals filed for this visit.   Subjective Assessment - 06/30/21 1536     Subjective Pt notes that she was sick with the flu over the weekend, had some achy pains from lack of activity.    Patient is accompained by: Interpreter    Diagnostic tests 05/03/21 - R hip x-ray: Mild degenerative changes of the hips and knees. No malalignment or acute fracture.    Patient Stated Goals "not having any more pain"    Currently in Pain? No/denies                               [facility] Adult  PT Treatment/Exercise - 06/30/21 0001       Lumbar Exercises: Stretches   Single Knee to Chest Stretch Right;Left;30 seconds      Lumbar Exercises: Standing   Row Strengthening;Both;20 reps;Theraband    Theraband Level (Row) Level 3 (Green)    Other Standing Lumbar Exercises pallof press and multifidus walkouts with green TB 10 reps with walkouts and 2x10 pallof press      Knee/Hip Exercises: Aerobic   Recumbent Bike L2x11min      Knee/Hip Exercises: Standing   Hip Abduction AROM;Both;10 reps;Knee straight    Hip Extension AROM;Both;10 reps;Knee straight;2 sets    Functional Squat 10 reps      Manual Therapy   Manual Therapy Soft tissue mobilization;Myofascial release    Soft tissue mobilization STM to R glutes, lumbar paraspinals    Myofascial Release TPR to R piriformis, glute med                       PT Short Term Goals - 06/23/21 1815       PT SHORT TERM GOAL #1   Title Patient will be independent with initial HEP    Status Achieved  06/23/21   Target Date 06/24/21               PT Long Term Goals - 06/10/21 1524       PT LONG TERM GOAL #1   Title Patient will be independent with ongoing/advanced HEP for self-management at home    Status On-going    Target Date 07/29/21      PT LONG TERM GOAL #2   Title Patient to demonstrate improved tissue quality and pliability as noted by reduced tissue tightness, TTP and guarding with hip, knee or ankle ROM    Status On-going    Target Date 07/29/21      PT LONG TERM GOAL #3   Title Patient will demonstrate improved B LE strength to >/= 4+/5 for improved stability and ease of mobility    Status On-going    Target Date 07/29/21      PT LONG TERM GOAL #4   Title Patient will report ability to resume walking for exercise w/o limitation due to R hip, B knee or ankle pain or weakness    Status On-going    Target Date 07/29/21      PT LONG TERM GOAL #5   Title Patient to report ability to perform ADLs,  household, and leisure activities without limitation due to R hip pain, LOM or weakness    Status On-going    Target Date 07/29/21                   Plan - 06/30/21 1617     Clinical Impression Statement Pt reported increased pain with standing hip ABD so we replaced this exercise with counter squats for comfort. She showed some difficulty with the pallof presses and required cues to keep straight ahead and prevent any compensation. She noted that the resisted rows felt really good on her back. She still has marked tightness in her R glutes and lumbar paraspinals but noted improvement after soft tissue work. She could work more on standing exercises.    Personal Factors and Comorbidities Time since onset of injury/illness/exacerbation;Past/Current Experience;Age;Comorbidity 3+    Comorbidities Breast cancer s/p L mastectomy, DM, back pain, HTN, OA, DJD, osteoporosis, diverticulitis    PT Frequency 2x / week    PT Duration 8 weeks    PT Treatment/Interventions ADLs/Self Care Home Management;Cryotherapy;Electrical Stimulation;Iontophoresis 4mg /ml Dexamethasone;Moist Heat;Ultrasound;Gait training;Stair training;Functional mobility training;Therapeutic activities;Therapeutic exercise;Balance training;Neuromuscular re-education;Patient/family education;Manual techniques;Passive range of motion;Dry needling;Taping;Vasopneumatic Device;Spinal Manipulations;Joint Manipulations    PT Next Visit Plan standing ther ex, higher level core exercises; MT and modalities PRN for pain    PT Home Exercise Plan Access Code: [email or web address] (11/8)    Consulted and Agree with Plan of Care Patient             Patient will benefit from skilled therapeutic intervention in order to improve the following deficits and impairments:  Decreased activity tolerance, Decreased mobility, Decreased range of motion, Decreased strength, Difficulty walking, Hypomobility, Increased fascial restricitons, Increased muscle spasms,  Impaired perceived functional ability, Impaired flexibility, Pain  Visit Diagnosis: Pain in right hip  Chronic pain of right knee  Chronic pain of left knee  Pain in right ankle and joints of right foot  Pain in left ankle and joints of left foot  Other muscle spasm  Muscle weakness (generalized)  Other abnormalities of gait and mobility  Difficulty in walking, not elsewhere classified     Problem List Patient Active Problem List   Diagnosis Date Noted  Greater trochanteric pain syndrome of right lower extremity 05/07/2021   Left lateral epicondylitis 02/28/2017   Left shoulder pain 02/28/2017   Bilateral knee pain 02/25/2017   Breast cancer, left breast ([place]) 10/02/2013   Hyperlipidemia 07/26/2013   Chest pain with moderate risk for cardiac etiology 07/06/2013   Headache(784.0) 10/03/2012   Menopause 02/17/2012   Diabetes mellitus ([place]) 01/25/2012   PUD (peptic ulcer disease) 01/25/2012   Diverticulitis 07/20/2011   General medical examination 06/18/2011   Back pain 11/25/2010   DEGENERATIVE JOINT DISEASE 01/21/2010   Essential hypertension 08/12/2009   BREAST CANCER, HX OF 08/12/2009    Artist Pais, PTA 06/30/2021, 5:51 PM  [facility] [address]  [place], [place], [address] Phone: [phone]   Fax:  [phone]  Name: Jillian Chandler MRN: [id number] Date of Birth: Mar 06, 1951

## 2021-07-03 ENCOUNTER — Ambulatory Visit: Payer: Medicare Other | Attending: Family Medicine | Admitting: Physical Therapy

## 2021-07-03 ENCOUNTER — Encounter: Payer: Self-pay | Admitting: Physical Therapy

## 2021-07-03 ENCOUNTER — Other Ambulatory Visit: Payer: Self-pay

## 2021-07-03 DIAGNOSIS — G8929 Other chronic pain: Secondary | ICD-10-CM | POA: Insufficient documentation

## 2021-07-03 DIAGNOSIS — M62838 Other muscle spasm: Secondary | ICD-10-CM | POA: Diagnosis present

## 2021-07-03 DIAGNOSIS — M25571 Pain in right ankle and joints of right foot: Secondary | ICD-10-CM | POA: Diagnosis present

## 2021-07-03 DIAGNOSIS — R2689 Other abnormalities of gait and mobility: Secondary | ICD-10-CM | POA: Diagnosis present

## 2021-07-03 DIAGNOSIS — M25572 Pain in left ankle and joints of left foot: Secondary | ICD-10-CM | POA: Insufficient documentation

## 2021-07-03 DIAGNOSIS — M25551 Pain in right hip: Secondary | ICD-10-CM | POA: Insufficient documentation

## 2021-07-03 DIAGNOSIS — R262 Difficulty in walking, not elsewhere classified: Secondary | ICD-10-CM | POA: Insufficient documentation

## 2021-07-03 DIAGNOSIS — M6281 Muscle weakness (generalized): Secondary | ICD-10-CM | POA: Diagnosis present

## 2021-07-03 DIAGNOSIS — M25561 Pain in right knee: Secondary | ICD-10-CM | POA: Insufficient documentation

## 2021-07-03 DIAGNOSIS — M25562 Pain in left knee: Secondary | ICD-10-CM | POA: Insufficient documentation

## 2021-07-03 NOTE — Therapy (Signed)
[facility] [address]  [place], [place], [address] Phone: [phone]   Fax:  [phone]  Physical Therapy Treatment / Progress Note  Patient Details  Name: Jillian Chandler MRN: [id number] Date of Birth: May 21, 1951 Referring Provider (PT): Rosemarie Ax, MD  Progress Note  Reporting Period 06/03/2021 to 07/03/2021  See note below for Objective Data and Assessment of Progress/Goals.     Encounter Date: 07/03/2021   PT End of Session - 07/03/21 1457     Visit Number 7    Number of Visits 16    Date for PT Re-Evaluation 07/29/21    Authorization Type UHC Medicare & Medicaid QMB    Progress Note Due on Visit 16   PN on visit #7 - 07/03/21; FOTO due on visit #10   PT Start Time 1457    PT Stop Time 1535    PT Time Calculation (min) 38 min    Activity Tolerance Patient tolerated treatment well;Patient limited by pain    Behavior During Therapy [facility] for tasks assessed/performed             Past Medical History:  Diagnosis Date   Arthritis    Breast cancer ([place]) 01/2009   left brest- status post mastectomy, intolerant to Arimidex, declined tamoxfien   Gastric ulcer    CLO (-) per EGD ~ 11-2011   Hyperlipidemia    Hypertension 2006    Past Surgical History:  Procedure Laterality Date   BREAST SURGERY     COLONOSCOPY W/ POLYPECTOMY  11/2011   MASTECTOMY Left 04/2009   MASTECTOMY Left    ROBOTIC ASSISTED BILATERAL SALPINGO OOPHERECTOMY Bilateral 12/09/2017   Procedure: XI ROBOTIC ASSISTED BILATERAL SALPINGO OOPHORECTOMY;  Surgeon: Isabel Caprice, MD;  Location: [facility];  Service: Gynecology;  Laterality: Bilateral;    There were no vitals filed for this visit.   Subjective Assessment - 07/03/21 1500     Subjective Pt initially denies pain today but upon further questioning reports ongoing constant R hip and midline LBP but notes 30-40% improvement since start of PT.    Patient is accompained by:  Interpreter    Diagnostic tests 05/03/21 - R hip x-ray: Mild degenerative changes of the hips and knees. No malalignment or acute fracture.    Patient Stated Goals "not having any more pain"    Currently in Pain? Yes    Pain Score 5     Pain Location Hip    Pain Orientation Right;Posterior;Lateral    Pain Descriptors / Indicators Throbbing    Pain Type Acute pain    Pain Score 4    Pain Location Back    Pain Orientation Lower    Pain Descriptors / Indicators --   "pinching"   Pain Type Acute pain                [facility] PT Assessment - 07/03/21 1457       Assessment   Medical Diagnosis R hip & B knee pain - R greater trochanteric pain syndrome    Referring Provider (PT) Rosemarie Ax, MD    Onset Date/Surgical Date 05/03/21   acute on chronic   Next MD Visit none scheduled      Strength   Right Hip Flexion 4+/5    Right Hip Extension 4/5    Right Hip External Rotation  4/5    Right Hip Internal Rotation 4+/5    Right Hip ABduction 4/5  Right Hip ADduction 4/5    Left Hip Flexion 4+/5    Left Hip Extension 4+/5    Left Hip External Rotation 4/5    Left Hip Internal Rotation 4+/5    Left Hip ABduction 4+/5    Left Hip ADduction 4+/5    Right Knee Flexion 4+/5    Right Knee Extension 4+/5    Left Knee Flexion 4+/5    Left Knee Extension 4+/5    Right Ankle Dorsiflexion 4+/5    Left Ankle Dorsiflexion 4+/5                           [facility] Adult PT Treatment/Exercise - 07/03/21 1457       Knee/Hip Exercises: Aerobic   Recumbent Bike L2 x 6 min      Knee/Hip Exercises: Standing   Lateral Step Up Right;Left;10 reps;Step Height: 6";Hand Hold: 2    Lateral Step Up Limitations cues to maintain level pelvis    Forward Step Up Right;Left;10 reps;Step Height: 6";Hand Hold: 2    Forward Step Up Limitations cues to maintain level pelvis    Functional Squat 10 reps;3 seconds    Functional Squat Limitations + red TB hip ABD isometric    Other Standing  Knee Exercises B red TB side stepping and fwd/back monster walk 2 x 20 ft                     PT Education - 07/03/21 1514     Education Details Role of DN in addressing pain and increased muscle tension    Person(s) Educated Patient    Methods Explanation;Handout    Comprehension Verbalized understanding;Need further instruction              PT Short Term Goals - 06/23/21 1815       PT SHORT TERM GOAL #1   Title Patient will be independent with initial HEP    Status Achieved   06/23/21   Target Date 06/24/21               PT Long Term Goals - 07/03/21 1508       PT LONG TERM GOAL #1   Title Patient will be independent with ongoing/advanced HEP for self-management at home    Status Partially Met   07/03/21 - Met for current HEP   Target Date 07/29/21      PT LONG TERM GOAL #2   Title Patient to demonstrate improved tissue quality and pliability as noted by reduced tissue tightness, TTP and guarding with hip, knee or ankle ROM    Status Partially Met   07/03/21 - Pt reports improvement in muscle tension and TTP allowing for significant improvement in movement and positional tolerance but still notes some tenderness in R glutes and lower lumbar and sacral paraspinals   Target Date 07/29/21      PT LONG TERM GOAL #3   Title Patient will demonstrate improved B LE strength to >/= 4+/5 for improved stability and ease of mobility    Status Partially Met   07/03/21 - Met for B hips & knees, partially met for B hip (refer to flowsheet for MMT)   Target Date 07/29/21      PT LONG TERM GOAL #4   Title Patient will report ability to resume walking for exercise w/o limitation due to R hip, B knee or ankle pain or weakness    Status Partially Met  07/03/21 - walking 30-60 min daily with mild pain but not bad enough to make her stop   Target Date 07/29/21      PT LONG TERM GOAL #5   Title Patient to report ability to perform ADLs, household, and leisure activities  without limitation due to R hip pain, LOM or weakness    Status Partially Met    Target Date 07/29/21                   Plan - 07/03/21 1535     Clinical Impression Statement Jillian Chandler reports 30-40% improvement in pain thus far with PT, noting improvement in muscle tension and TTP allowing for significant improvement in movement and positional tolerance but still notes some tenderness in R glutes and lower lumbar and sacral paraspinals - she is now walking 30-60 minutes daily for exercise with mild pain but not bad enough to make her stop. Her overall B strength is improving with B knees and ankles as well as several of hip muscle groups now 4+/5. She is progressing well toward her goals with all LTGs now at least partially met. She has been responding to manual STM/MFR but may benefit from DN to further address ongoing tenderness and increased muscle tension in glutes and lumbosacral paraspinals, therefore provided educational handout for her review and will proceed with DN in conjunction with MT as indicated in upcoming visits. In the meantime, therapeutic exercises progressed to continue to address ongoing proximal LE weakness.    Comorbidities Breast cancer s/p L mastectomy, DM, back pain, HTN, OA, DJD, osteoporosis, diverticulitis    Rehab Potential Good    PT Frequency 2x / week    PT Duration 8 weeks    PT Treatment/Interventions ADLs/Self Care Home Management;Cryotherapy;Electrical Stimulation;Iontophoresis 3m/ml Dexamethasone;Moist Heat;Ultrasound;Gait training;Stair training;Functional mobility training;Therapeutic activities;Therapeutic exercise;Balance training;Neuromuscular re-education;Patient/family education;Manual techniques;Passive range of motion;Dry needling;Taping;Vasopneumatic Device;Spinal Manipulations;Joint Manipulations    PT Next Visit Plan standing ther ex, higher level core exercises; MT +/- DN as indicated and modalities PRN for pain    PT Home Exercise Plan Access  Code: [email or web address](11/8, updated 11/21)    Consulted and Agree with Plan of Care Patient             Patient will benefit from skilled therapeutic intervention in order to improve the following deficits and impairments:  Decreased activity tolerance, Decreased mobility, Decreased range of motion, Decreased strength, Difficulty walking, Hypomobility, Increased fascial restricitons, Increased muscle spasms, Impaired perceived functional ability, Impaired flexibility, Pain  Visit Diagnosis: Pain in right hip  Chronic pain of right knee  Chronic pain of left knee  Pain in right ankle and joints of right foot  Pain in left ankle and joints of left foot  Other muscle spasm  Muscle weakness (generalized)  Other abnormalities of gait and mobility  Difficulty in walking, not elsewhere classified     Problem List Patient Active Problem List   Diagnosis Date Noted   Greater trochanteric pain syndrome of right lower extremity 05/07/2021   Left lateral epicondylitis 02/28/2017   Left shoulder pain 02/28/2017   Bilateral knee pain 02/25/2017   Breast cancer, left breast ([place] 10/02/2013   Hyperlipidemia 07/26/2013   Chest pain with moderate risk for cardiac etiology 07/06/2013   Headache(784.0) 10/03/2012   Menopause 02/17/2012   Diabetes mellitus ([place] 01/25/2012   PUD (peptic ulcer disease) 01/25/2012   Diverticulitis 07/20/2011   General medical examination 06/18/2011   Back pain 11/25/2010   DEGENERATIVE JOINT DISEASE 01/21/2010  Essential hypertension 08/12/2009   BREAST CANCER, HX OF 08/12/2009    Percival Spanish, PT 07/03/2021, 7:55 PM  [facility] [address]  [place], [place], [address] Phone: [phone]   Fax:  [phone]  Name: Jillian Chandler MRN: [id number] Date of Birth: 10-11-1950

## 2021-07-03 NOTE — Patient Instructions (Signed)
Punto de activacin Necesidad seca    Qu es trigger point dry needling (DN)? DN es una tcnica de fisioterapia [place] para tratar el dolor muscular y la disfuncin. Especficamente, DN ayuda a desactivar los puntos de activacin muscular (nudos musculares).  Se utiliza una aguja filiforma delgada para penetrar en la piel y estimular el punto de activacin subyacente. El objetivo es que se produzca una respuesta de twitch local (LTR) y que el punto de disparo se relaje. No se inyecta ningn tipo de medicamento durante el procedimiento.     Cmo se siente trigger point dry needling?  Elprocedimiento T se siente diferente para cada paciente individual. Algunos pacientes informan que en realidad no sienten que la aguja entra en la piel y en general el proceso no es doloroso. Puede producirse sangrado muy leve. Sin embargo, muchos pacientes sienten un profundo calambres en el msculo en el que se insert la aguja. Esta es la respuesta de twitch local.     Cmo me sentir despus del tratamiento? El dolor es normal y es posible que la aparicin del dolor no se produzca durante unas horas. Por lo general,  este dolor no dura ms [employer]. Sin embargo, el ruising B es poco frecuente; el hielo se puede utilizar para disminuir cualquier posible hematoma. En raras ocasiones sentirse cansado o con nuseas despus del tratamiento es normal. Adems, los sntomas pueden empeorar antes de que mejoren, por lo general este perodo no durar ms de 24 horas.     Qu puedo hacer despus de mi tratamiento? Aumenta tu hidratacin bebiendo ms agua durante las prximas 24 horas. You puede colocar hielo o calor en las reas tratadas que se han vuelto doloridas, sin embargo, no Software engineer en reas inflamadas o magulladas. El calor a menudo trae ms alivio despus de la necesidad. Puedecontinuar con sus actividades regulares, pero no se recomienda una actividad vigorosa inicialmente despus del tratamiento  durante 24 horas. DN se combina mejor con otras fisioterapias como el fortalecimiento, estiramiento y [place] terapias.   Trigger Point Dry Needling  What is Trigger Point Dry Needling (DN)? DN is a physical therapy technique used to treat muscle pain and dysfunction. Specifically, DN helps deactivate muscle trigger points (muscle knots).  A thin filiform needle is used to penetrate the skin and stimulate the underlying trigger point. The goal is for a local twitch response (LTR) to occur and for the trigger point to relax. No medication of any kind is injected during the procedure.   What Does Trigger Point Dry Needling Feel Like?  The procedure feels different for each individual patient. Some patients report that they do not actually feel the needle enter the skin and overall the process is not painful. Very mild bleeding may occur. However, many patients feel a deep cramping in the muscle in which the needle was inserted. This is the local twitch response.   How Will I feel after the treatment? Soreness is normal, and the onset of soreness may not occur for a few hours. Typically this soreness does not last longer than two days.  Bruising is uncommon, however; ice can be used to decrease any possible bruising.  In rare cases feeling tired or nauseous after the treatment is normal. In addition, your symptoms may get worse before they get better, this period will typically not last longer than 24 hours.   What Can I do After My Treatment? Increase your hydration by drinking more water for the next 24 hours. You  may place ice or heat on the areas treated that have become sore, however, do not use heat on inflamed or bruised areas. Heat often brings more relief post needling. You can continue your regular activities, but vigorous activity is not recommended initially after the treatment for 24 hours. DN is best combined with other physical therapy such as strengthening, stretching, and other  therapies.

## 2021-07-07 ENCOUNTER — Other Ambulatory Visit: Payer: Self-pay

## 2021-07-07 ENCOUNTER — Ambulatory Visit: Payer: Medicare Other | Admitting: Physical Therapy

## 2021-07-07 ENCOUNTER — Encounter: Payer: Self-pay | Admitting: Physical Therapy

## 2021-07-07 DIAGNOSIS — R262 Difficulty in walking, not elsewhere classified: Secondary | ICD-10-CM

## 2021-07-07 DIAGNOSIS — M6281 Muscle weakness (generalized): Secondary | ICD-10-CM

## 2021-07-07 DIAGNOSIS — M25562 Pain in left knee: Secondary | ICD-10-CM

## 2021-07-07 DIAGNOSIS — R2689 Other abnormalities of gait and mobility: Secondary | ICD-10-CM

## 2021-07-07 DIAGNOSIS — M25551 Pain in right hip: Secondary | ICD-10-CM | POA: Diagnosis not present

## 2021-07-07 DIAGNOSIS — M25572 Pain in left ankle and joints of left foot: Secondary | ICD-10-CM

## 2021-07-07 DIAGNOSIS — M62838 Other muscle spasm: Secondary | ICD-10-CM

## 2021-07-07 DIAGNOSIS — M25571 Pain in right ankle and joints of right foot: Secondary | ICD-10-CM

## 2021-07-07 DIAGNOSIS — G8929 Other chronic pain: Secondary | ICD-10-CM

## 2021-07-07 NOTE — Therapy (Signed)
[facility] [address]  [place], [place], [address] Phone: [phone]   Fax:  [phone]  Physical Therapy Treatment  Patient Details  Name: Jillian [facility] MRN: [id number] Date of Birth: [birth year] Referring Provider (PT): Rosemarie Ax, MD   Encounter Date: 07/07/2021   PT End of Session - 07/07/21 1536     Visit Number 8    Number of Visits 16    Date for PT Re-Evaluation 07/29/21    Authorization Type UHC Medicare & Medicaid QMB    Progress Note Due on Visit 16   PN on visit #7 - 07/03/21; FOTO due on visit #10   PT Start Time 1536    PT Stop Time 1616    PT Time Calculation (min) 40 min    Activity Tolerance Patient tolerated treatment well;Patient limited by pain    Behavior During Therapy [facility] for tasks assessed/performed             Past Medical History:  Diagnosis Date   Arthritis    Breast cancer ([place]) 01/2009   left brest- status post mastectomy, intolerant to Arimidex, declined tamoxfien   Gastric ulcer    CLO (-) per EGD ~ 11-2011   Hyperlipidemia    Hypertension 2006    Past Surgical History:  Procedure Laterality Date   BREAST SURGERY     COLONOSCOPY W/ POLYPECTOMY  11/2011   MASTECTOMY Left 04/2009   MASTECTOMY Left    ROBOTIC ASSISTED BILATERAL SALPINGO OOPHERECTOMY Bilateral 12/09/2017   Procedure: XI ROBOTIC ASSISTED BILATERAL SALPINGO OOPHORECTOMY;  Surgeon: Isabel Caprice, MD;  Location: [facility];  Service: Gynecology;  Laterality: Bilateral;    There were no vitals filed for this visit.   Subjective Assessment - 07/07/21 1539     Subjective Pt reports pain on dorsum of R>L ankle which she thinks is related to the cold weather, but denies hip or back pain today.    Patient is accompained by: Interpreter    Diagnostic tests 05/03/21 - R hip x-ray: Mild degenerative changes of the hips and knees. No malalignment or acute fracture.    Patient Stated Goals "not having any more  pain"    Currently in Pain? Yes    Pain Score 0-No pain    Pain Location Hip    Pain Orientation Right    Pain Score 0    Pain Location Back    Pain Score 6    Pain Location Ankle    Pain Orientation Right;Left   R>L   Pain Descriptors / Indicators Stabbing;Burning    Pain Type Acute pain    Pain Frequency Intermittent    Aggravating Factors  cold weather                               [facility] Adult PT Treatment/Exercise - 07/07/21 1536       Lumbar Exercises: Stretches   Single Knee to Chest Stretch Right;Left;2 reps;30 seconds    Piriformis Stretch Right;Left;2 reps;30 seconds    Piriformis Stretch Limitations hooklying KTOS    Figure 4 Stretch 2 reps;30 seconds;Supine;With overpressure    Figure 4 Stretch Limitations figure-4 to chest      Knee/Hip Exercises: Aerobic   Nustep L5 x 6 min (UE/LE)      Knee/Hip Exercises: Standing   Hip Abduction Right;Left;10 reps;Stengthening;Knee straight    Abduction Limitations looped red TB at ankles  Hip Extension Right;Left;10 reps;Stengthening;Knee straight    Extension Limitations looped red TB at ankles    Functional Squat 10 reps;3 seconds    Functional Squat Limitations + red TB hip ABD isometric    Other Standing Knee Exercises B red TB side stepping and fwd/back monster walk 2 x 20 ft      Manual Therapy   Manual Therapy Soft tissue mobilization;Myofascial release    Manual therapy comments skilled palpation and monitoring during DN    Soft tissue mobilization STM/DTM to R glutes, ant tibialis & peroneals    Myofascial Release manual TPR to R glute med/min; pin & stretch to R anterior tibialis & peroneals              Trigger Point Dry Needling - 07/07/21 1536     Consent Given? Yes    Education Handout Provided Previously provided    Muscles Treated Lower Quadrant Anterior tibialis;Peroneals   Rt   Muscles Treated Back/Hip Gluteus medius;Gluteus minimus   Rt   Peroneals Response Twitch  response elicited;Palpable increased muscle length    Anterior tibialis Response Twitch response elicited;Palpable increased muscle length    Gluteus Minimus Response Twitch response elicited;Palpable increased muscle length    Gluteus Medius Response Twitch response elicited;Palpable increased muscle length                     PT Short Term Goals - 06/23/21 1815       PT SHORT TERM GOAL #1   Title Patient will be independent with initial HEP    Status Achieved   06/23/21   Target Date 06/24/21               PT Long Term Goals - 07/03/21 1508       PT LONG TERM GOAL #1   Title Patient will be independent with ongoing/advanced HEP for self-management at home    Status Partially Met   07/03/21 - Met for current HEP   Target Date 07/29/21      PT LONG TERM GOAL #2   Title Patient to demonstrate improved tissue quality and pliability as noted by reduced tissue tightness, TTP and guarding with hip, knee or ankle ROM    Status Partially Met   07/03/21 - Pt reports improvement in muscle tension and TTP allowing for significant improvement in movement and positional tolerance but still notes some tenderness in R glutes and lower lumbar and sacral paraspinals   Target Date 07/29/21      PT LONG TERM GOAL #3   Title Patient will demonstrate improved B LE strength to >/= 4+/5 for improved stability and ease of mobility    Status Partially Met   07/03/21 - Met for B hips & knees, partially met for B hip (refer to flowsheet for MMT)   Target Date 07/29/21      PT LONG TERM GOAL #4   Title Patient will report ability to resume walking for exercise w/o limitation due to R hip, B knee or ankle pain or weakness    Status Partially Met   07/03/21 - walking 30-60 min daily with mild pain but not bad enough to make her stop   Target Date 07/29/21      PT LONG TERM GOAL #5   Title Patient to report ability to perform ADLs, household, and leisure activities without limitation due to R  hip pain, LOM or weakness    Status Partially Met    Target  Date 07/29/21                   Plan - 07/07/21 1543     Clinical Impression Statement Marguita notes less pain in low back and hip today but still some tightness and TTP in R glutes as well as increased pain in dorsum of R ankle - she expressed desire to proceed with trial of DN as discussed last visit. After review of explanation of DN rational, procedures, outcomes and potential side effects, patient verbalized consent to DN treatment in conjunction with manual STM/DTM and TPR to reduce ttp/muscle tension. Muscles treated include R glute medius and minimus as well as R anterior tibialis and peroneals. DN produced normal response with good twitches elicited resulting in palpable reduction in pain/ttp and muscle tension allowing for pain free performance of stretches and strengthening exercises. Pt educated to expect mild to moderate muscle soreness for up to 24-48 hrs and instructed to continue prescribed home exercise program and current activity level with pt verbalizing understanding of theses instructions.    Comorbidities Breast cancer s/p L mastectomy, DM, back pain, HTN, OA, DJD, osteoporosis, diverticulitis    Rehab Potential Good    PT Frequency 2x / week    PT Duration 8 weeks    PT Treatment/Interventions ADLs/Self Care Home Management;Cryotherapy;Electrical Stimulation;Iontophoresis 48m/ml Dexamethasone;Moist Heat;Ultrasound;Gait training;Stair training;Functional mobility training;Therapeutic activities;Therapeutic exercise;Balance training;Neuromuscular re-education;Patient/family education;Manual techniques;Passive range of motion;Dry needling;Taping;Vasopneumatic Device;Spinal Manipulations;Joint Manipulations    PT Next Visit Plan assess long-term response to DN; standing ther ex, higher level core exercises; MT +/- DN as indicated and benefit noted; modalities PRN for pain    PT Home Exercise Plan Access Code:  [email or web address](11/8, updated 11/21)    Consulted and Agree with Plan of Care Patient             Patient will benefit from skilled therapeutic intervention in order to improve the following deficits and impairments:  Decreased activity tolerance, Decreased mobility, Decreased range of motion, Decreased strength, Difficulty walking, Hypomobility, Increased fascial restricitons, Increased muscle spasms, Impaired perceived functional ability, Impaired flexibility, Pain  Visit Diagnosis: Pain in right hip  Chronic pain of right knee  Chronic pain of left knee  Pain in right ankle and joints of right foot  Pain in left ankle and joints of left foot  Other muscle spasm  Muscle weakness (generalized)  Other abnormalities of gait and mobility  Difficulty in walking, not elsewhere classified     Problem List Patient Active Problem List   Diagnosis Date Noted   Greater trochanteric pain syndrome of right lower extremity 05/07/2021   Left lateral epicondylitis 02/28/2017   Left shoulder pain 02/28/2017   Bilateral knee pain 02/25/2017   Breast cancer, left breast ([place] 10/02/2013   Hyperlipidemia 07/26/2013   Chest pain with moderate risk for cardiac etiology 07/06/2013   Headache(784.0) 10/03/2012   Menopause 02/17/2012   Diabetes mellitus ([place] 01/25/2012   PUD (peptic ulcer disease) 01/25/2012   Diverticulitis 07/20/2011   General medical examination 06/18/2011   Back pain 11/25/2010   DEGENERATIVE JOINT DISEASE 01/21/2010   Essential hypertension 08/12/2009   BREAST CANCER, HX OF 08/12/2009    JPercival Spanish PT 07/07/2021, 7:59 PM  [facility] [address] [place] [address]Phone: [phone]  Fax:  [phone] Name: RKavita BartlMRN: [id number] of Birth: 501-16-1952

## 2021-07-09 ENCOUNTER — Other Ambulatory Visit: Payer: Self-pay

## 2021-07-09 ENCOUNTER — Encounter (HOSPITAL_BASED_OUTPATIENT_CLINIC_OR_DEPARTMENT_OTHER): Payer: Self-pay | Admitting: *Deleted

## 2021-07-09 DIAGNOSIS — R42 Dizziness and giddiness: Secondary | ICD-10-CM | POA: Diagnosis present

## 2021-07-09 DIAGNOSIS — I10 Essential (primary) hypertension: Secondary | ICD-10-CM | POA: Insufficient documentation

## 2021-07-09 DIAGNOSIS — Z87891 Personal history of nicotine dependence: Secondary | ICD-10-CM | POA: Diagnosis not present

## 2021-07-09 DIAGNOSIS — E119 Type 2 diabetes mellitus without complications: Secondary | ICD-10-CM | POA: Diagnosis not present

## 2021-07-09 DIAGNOSIS — Z79899 Other long term (current) drug therapy: Secondary | ICD-10-CM | POA: Insufficient documentation

## 2021-07-09 DIAGNOSIS — Z853 Personal history of malignant neoplasm of breast: Secondary | ICD-10-CM | POA: Insufficient documentation

## 2021-07-09 NOTE — ED Triage Notes (Signed)
C/o increased BP and  dizziness  after drinking a " red bull "

## 2021-07-10 ENCOUNTER — Emergency Department (HOSPITAL_BASED_OUTPATIENT_CLINIC_OR_DEPARTMENT_OTHER)
Admission: EM | Admit: 2021-07-10 | Discharge: 2021-07-10 | Disposition: A | Discharge status: Home / Self Care | Payer: Medicare Other | Attending: Emergency Medicine | Admitting: Emergency Medicine

## 2021-07-10 DIAGNOSIS — R42 Dizziness and giddiness: Secondary | ICD-10-CM

## 2021-07-10 NOTE — ED Notes (Signed)
Patient states dizziness with movement, denies dizziness while lying down.

## 2021-07-10 NOTE — ED Provider Notes (Signed)
[facility] Provider Note   CSN: [id number] Arrival date & time: 07/09/21  2223     History Chief Complaint  Patient presents with   Hypertension    J[facility] is a 70 y.o. female.  The history is provided by the patient. A language interpreter was used.  Hypertension Jillian Chandler is a 70 y.o. female who presents to the Emergency Department complaining of dizziness.  She presents to the ED after drinking a sugar free red bull (730pm) and felt dizzy (9pm).  She checked her blood pressure and it was high (172).  Dizziness is described as worse with getting up/walking.  Unsteady with walking.  Dizziness completely resolved at time of ED evaluation.   No additional symptoms.  Sxs were constant when present.  No nausea, HA, CP.  She checked her blood sugar at home and it was normal.  She occasionally drinks a sip of energy drinks but normally does not drink an entire energy drink.  Has a hx/o HTN, HPL.  Has a hx/o vertigo one year ago, today's episode was worse.       Past Medical History:  Diagnosis Date   Arthritis    Breast cancer ([place]) 01/2009   left brest- status post mastectomy, intolerant to Arimidex, declined tamoxfien   Gastric ulcer    CLO (-) per EGD ~ 11-2011   Hyperlipidemia    Hypertension 2006    Patient Active Problem List   Diagnosis Date Noted   Greater trochanteric pain syndrome of right lower extremity 05/07/2021   Left lateral epicondylitis 02/28/2017   Left shoulder pain 02/28/2017   Bilateral knee pain 02/25/2017   Breast cancer, left breast ([place]) 10/02/2013   Hyperlipidemia 07/26/2013   Chest pain with moderate risk for cardiac etiology 07/06/2013   Headache(784.0) 10/03/2012   Menopause 02/17/2012   Diabetes mellitus ([place]) 01/25/2012   PUD (peptic ulcer disease) 01/25/2012   Diverticulitis 07/20/2011   General medical examination 06/18/2011   Back pain 11/25/2010   DEGENERATIVE JOINT DISEASE 01/21/2010    Essential hypertension 08/12/2009   BREAST CANCER, HX OF 08/12/2009    Past Surgical History:  Procedure Laterality Date   BREAST SURGERY     COLONOSCOPY W/ POLYPECTOMY  11/2011   MASTECTOMY Left 04/2009   MASTECTOMY Left    ROBOTIC ASSISTED BILATERAL SALPINGO OOPHERECTOMY Bilateral 12/09/2017   Procedure: XI ROBOTIC ASSISTED BILATERAL SALPINGO OOPHORECTOMY;  Surgeon: Isabel Caprice, MD;  Location: [facility];  Service: Gynecology;  Laterality: Bilateral;     OB History     Gravida  4   Para  3   Term  3   Preterm      AB  1   Living  3      SAB  1   IAB      Ectopic      Multiple      Live Births  3           Family History  Problem Relation Age of Onset   Heart attack Father 77       F age 40, M age 86s   Heart disease Father    Diabetes Sister    Diabetes Brother    Diabetes Sister    Heart disease Mother    Diabetes Maternal Aunt    Diabetes Maternal Uncle    Diabetes Paternal Aunt    Diabetes Paternal Uncle    Diabetes Maternal Grandmother    Diabetes Maternal Grandfather  Diabetes Paternal Grandmother    Diabetes Paternal Grandfather    Stroke Neg Hx    Colon cancer Neg Hx    Breast cancer Neg Hx     Social History   Tobacco Use   Smoking status: Former    Types: Cigarettes    Quit date: 02/17/2007    Years since quitting: 14.4   Smokeless tobacco: Never  Vaping Use   Vaping Use: Never used  Substance Use Topics   Alcohol use: No   Drug use: No    Home Medications Prior to Admission medications   Medication Sig Start Date End Date Taking? Authorizing Provider  acetaminophen (TYLENOL) 500 MG tablet Take 1 tablet (500 mg total) by mouth every 6 (six) hours as needed. 09/15/15   Antonietta Breach, PA-C  atorvastatin (LIPITOR) 40 MG tablet TAKE 1 TABLET BY MOUTH ONCE DAILY 09/05/18   Saguier, Percell Miller, PA-C  b complex vitamins capsule Take 1 capsule by mouth daily.    [provider]  Cholecalciferol (VITAMIN D3 PO) Take 3 tablets by  mouth daily before breakfast.     [provider]  diazepam (VALIUM) 5 MG tablet 1 tab po q hs prn insomnia or anxiety 03/17/19   Saguier, Percell Miller, PA-C  diclofenac Sodium (VOLTAREN) 1 % GEL Apply 4 g topically 4 (four) times daily. To affected joint. 05/07/21   Rosemarie Ax, MD  fluticasone (FLONASE) 50 MCG/ACT nasal spray Place 1 spray into both nostrils daily. 07/30/18   Caccavale, Sophia, PA-C  lisinopril-hydrochlorothiazide (ZESTORETIC) 20-25 MG tablet Take 1 tablet by mouth once daily 06/22/19   Saguier, Percell Miller, PA-C  meloxicam [facility]) 15 MG tablet Take 1 tablet by mouth once daily 03/21/19   Saguier, Percell Miller, PA-C  meloxicam (MOBIC) 15 MG tablet Take 1 tablet by mouth daily. 04/17/20   [provider]  metoprolol succinate (TOPROL-XL) 50 MG 24 hr tablet TAKE 1 TABLET BY MOUTH ONCE DAILY **TAKE  WITH  OR  IMMEDIATELY  FOLLOWING  A  MEAL 09/23/19   Saguier, Percell Miller, PA-C  olmesartan-hydrochlorothiazide (BENICAR HCT) 40-25 MG tablet Take 1 tablet by mouth daily. 04/17/20   [provider]  Omega-3 Fatty Acids (OMEGA 3 PO) Take 2 capsules by mouth daily.    [provider]  phenol (CHLORASEPTIC) 1.4 % LIQD Use as directed 1 spray in the mouth or throat as needed for throat irritation / pain. 07/30/18   Caccavale, Sophia, PA-C  pyridOXINE (VITAMIN B-6) 100 MG tablet Take 100 mg by mouth daily before breakfast.    [provider]  QUEtiapine (SEROQUEL) 50 MG tablet Take 1 tablet (50 mg total) by mouth at bedtime. 04/05/19   Saguier, Percell Miller, PA-C  rosuvastatin (CRESTOR) 10 MG tablet Take by mouth. 04/17/20   [provider]  vitamin E 400 UNIT capsule Take 1,200 Units by mouth daily before breakfast.     [provider]    Allergies    Patient has no known allergies.  Review of Systems   Review of Systems  All other systems reviewed and are negative.  Physical Exam Updated Vital Signs BP 134/74   Pulse 78   Temp 98.3 F (36.8 C)  (Oral)   Resp 16   Ht 5\' 8"  (1.727 m)   Wt 78 kg   SpO2 98%   BMI 26.15 kg/m   Physical Exam Vitals and nursing note reviewed.  Constitutional:      Appearance: She is well-developed.  HENT:     Head: Normocephalic and  atraumatic.  Cardiovascular:     Rate and Rhythm: Normal rate and regular rhythm.     Heart sounds: No murmur heard. Pulmonary:     Effort: Pulmonary effort is normal. No respiratory distress.     Breath sounds: Normal breath sounds.  Abdominal:     Palpations: Abdomen is soft.     Tenderness: There is no abdominal tenderness. There is no guarding or rebound.  Musculoskeletal:        General: No tenderness.  Skin:    General: Skin is warm and dry.  Neurological:     Mental Status: She is alert and oriented to person, place, and time.     Comments: No pronator drift.  EOMI.  No asymmetry of facial movements.  5 out of 5 strength in all 4 extremities.  Normal gait.  Psychiatric:        Behavior: Behavior normal.    ED Results / Procedures / Treatments   Labs (all labs ordered are listed, but only abnormal results are displayed) Labs Reviewed - No data to display  EKG EKG Interpretation  Date/Time:  Wednesday July 09 2021 22:43:57 EST Ventricular Rate:  74 PR Interval:  200 QRS Duration: 98 QT Interval:  368 QTC Calculation: 408 R Axis:   24 Text Interpretation: Normal sinus rhythm Minimal voltage criteria for LVH, may be normal variant ( Cornell product ) Borderline ECG Confirmed by Quintella Reichert [id number]) on 07/10/2021 2:39:01 AM  Radiology No results found.  Procedures Procedures   Medications Ordered in ED Medications - No data to display  ED Course  I have reviewed the triage vital signs and the nursing notes.  Pertinent labs & imaging results that were available during my care of the patient were reviewed by me and considered in my medical decision making (see chart for details).    MDM Rules/Calculators/A&P                           Patient with history of hypertension here for evaluation of dizziness after drinking a can of red bowl.  Her symptoms were completely resolved at time of ED assessment.  She has a normal neurologic exam.  Her blood pressure is within normal limits.  Current presentation is not consistent with CVA, hypertensive urgency.  Discussed with patient recommendation of avoiding highly caffeinated products since she is rubble as this may be a trigger for her symptoms.  Plan to discharge home with outpatient follow-up and return precautions  Final Clinical Impression(s) / ED Diagnoses Final diagnoses:  Dizziness    Rx / DC Orders ED Discharge Orders     None        Quintella Reichert, MD 07/10/21 [phone]

## 2021-07-16 ENCOUNTER — Ambulatory Visit: Payer: Medicare Other | Admitting: Physical Therapy

## 2021-07-16 ENCOUNTER — Other Ambulatory Visit: Payer: Self-pay

## 2021-07-16 DIAGNOSIS — M25571 Pain in right ankle and joints of right foot: Secondary | ICD-10-CM

## 2021-07-16 DIAGNOSIS — M25561 Pain in right knee: Secondary | ICD-10-CM

## 2021-07-16 DIAGNOSIS — M25551 Pain in right hip: Secondary | ICD-10-CM

## 2021-07-16 DIAGNOSIS — M6281 Muscle weakness (generalized): Secondary | ICD-10-CM

## 2021-07-16 DIAGNOSIS — M62838 Other muscle spasm: Secondary | ICD-10-CM

## 2021-07-16 DIAGNOSIS — M25572 Pain in left ankle and joints of left foot: Secondary | ICD-10-CM

## 2021-07-16 DIAGNOSIS — R2689 Other abnormalities of gait and mobility: Secondary | ICD-10-CM

## 2021-07-16 DIAGNOSIS — G8929 Other chronic pain: Secondary | ICD-10-CM

## 2021-07-16 DIAGNOSIS — R262 Difficulty in walking, not elsewhere classified: Secondary | ICD-10-CM

## 2021-07-16 NOTE — Therapy (Signed)
[facility] [address]  [place], [place], [address] Phone: [phone]   Fax:  [phone]  Physical Therapy Treatment and Discharge  Patient Details  Name: Jillian Chandler MRN: [id number] Date of Birth: 02/09/1951 Referring Provider (PT): Rosemarie Ax, MD  PHYSICAL THERAPY DISCHARGE SUMMARY  Visits from Start of Care: 9  Current functional level related to goals / functional outcomes: Able to return to walking. Improved FOTO score.    Remaining deficits: Some on and off pain depending on cold weather   Education / Equipment: Discussed continuing HEP at home. Discussed PT if needed for her bilat shoulder pain   Patient agrees to discharge. Patient goals were met. Patient is being discharged due to being pleased with the current functional level.   Encounter Date: 07/16/2021   PT End of Session - 07/16/21 1534     Visit Number 9    Number of Visits 16    Date for PT Re-Evaluation 07/29/21    Authorization Type [employer] Medicare & Medicaid [employer]    Progress Note Due on Visit 16   PN on visit #7 - 07/03/21; FOTO due on visit #10   PT Start Time 1532    PT Stop Time 1615    PT Time Calculation (min) 43 min    Activity Tolerance Patient tolerated treatment well;Patient limited by pain    Behavior During Therapy [facility] for tasks assessed/performed             Past Medical History:  Diagnosis Date   Arthritis    Breast cancer ([place]) 01/2009   left brest- status post mastectomy, intolerant to Arimidex, declined tamoxfien   Gastric ulcer    CLO (-) per EGD ~ 11-2011   Hyperlipidemia    Hypertension 2006    Past Surgical History:  Procedure Laterality Date   BREAST SURGERY     COLONOSCOPY W/ POLYPECTOMY  11/2011   MASTECTOMY Left 04/2009   MASTECTOMY Left    ROBOTIC ASSISTED BILATERAL SALPINGO OOPHERECTOMY Bilateral 12/09/2017   Procedure: XI ROBOTIC ASSISTED BILATERAL SALPINGO OOPHORECTOMY;  Surgeon: Isabel Caprice, MD;  Location: [facility];  Service: Gynecology;  Laterality: Bilateral;    There were no vitals filed for this visit.   Subjective Assessment - 07/16/21 1534     Subjective "Everything is the same. The cold is the most limiting and it hurts her when going outside. I feel ready for PT d/c." Pt states she feels she has gotten what she has needed from therapy. Pt is agreeable for TPDN.    Patient is accompained by: Interpreter    Diagnostic tests 05/03/21 - R hip x-ray: Mild degenerative changes of the hips and knees. No malalignment or acute fracture.    Patient Stated Goals "not having any more pain"    Currently in Pain? No/denies                [facility] PT Assessment - 07/16/21 0001       Assessment   Medical Diagnosis R hip & B knee pain - R greater trochanteric pain syndrome    Referring Provider (PT) Rosemarie Ax, MD    Onset Date/Surgical Date 05/03/21      Strength   Right Hip Extension 4+/5    Right Hip External Rotation  4+/5    Right Hip Internal Rotation 4+/5    Right Hip ABduction 4+/5    Right Hip ADduction 4+/5    Left Hip  Extension 5/5    Left Hip External Rotation 4+/5    Left Hip Internal Rotation 4+/5    Left Hip ABduction 4+/5    Left Hip ADduction 4+/5    Right Knee Flexion 5/5    Right Knee Extension 5/5    Left Knee Flexion 5/5    Left Knee Extension 5/5    Right Ankle Dorsiflexion 5/5    Left Ankle Dorsiflexion 5/5                           [facility] Adult PT Treatment/Exercise - 07/16/21 0001       Knee/Hip Exercises: Aerobic   Nustep L5 x 6 min (UE/LE)      Manual Therapy   Manual therapy comments skilled palpation and monitoring during DN    Soft tissue mobilization STM/DTM to R glutes and bilat deltoids    Myofascial Release manual TPR to R glute med/min              Trigger Point Dry Needling - 07/16/21 0001     Muscles Treated Upper Quadrant Deltoid    Deltoid Response Twitch response elicited;Palpable  increased muscle length    Gluteus Minimus Response Twitch response elicited;Palpable increased muscle length    Gluteus Medius Response Twitch response elicited;Palpable increased muscle length                     PT Short Term Goals - 06/23/21 1815       PT SHORT TERM GOAL #1   Title Patient will be independent with initial HEP    Status Achieved   06/23/21   Target Date 06/24/21               PT Long Term Goals - 07/16/21 1550       PT LONG TERM GOAL #1   Title Patient will be independent with ongoing/advanced HEP for self-management at home    Baseline Doing daily at home (07/16/21)    Status Achieved   07/03/21 - Met for current HEP   Target Date 07/29/21      PT LONG TERM GOAL #2   Title Patient to demonstrate improved tissue quality and pliability as noted by reduced tissue tightness, TTP and guarding with hip, knee or ankle ROM    Baseline Some TTP along glute; otherwise reports no issues in knee or ankle    Status Partially Met   07/03/21 - Pt reports improvement in muscle tension and TTP allowing for significant improvement in movement and positional tolerance but still notes some tenderness in R glutes and lower lumbar and sacral paraspinals   Target Date 07/29/21      PT LONG TERM GOAL #3   Title Patient will demonstrate improved B LE strength to >/= 4+/5 for improved stability and ease of mobility    Status Achieved   07/03/21 - Met for B hips & knees, partially met for B hip (refer to flowsheet for MMT)   Target Date 07/29/21      PT LONG TERM GOAL #4   Title Patient will report ability to resume walking for exercise w/o limitation due to R hip, B knee or ankle pain or weakness    Baseline Has not been able to because of the weather but feels that she can (will join gym)    Status Achieved   07/03/21 - walking 30-60 min daily with mild pain but not bad enough to  make her stop   Target Date 07/29/21      PT LONG TERM GOAL #5   Title Patient to  report ability to perform ADLs, household, and leisure activities without limitation due to R hip pain, LOM or weakness    Status Achieved    Target Date 07/29/21                   Plan - 07/16/21 1613     Clinical Impression Statement Pt reports she is ready for PT d/c. Pt states she understands her HEP and has no questions. Did not adjust HEP at this time as she states she has been performing them daily with plans to join the gym. Pt has met or partially met her LTGs. Pt demos improved overall LE strength. Continues to have glute med/min trigger point addressed with TPDN. Continued to encourage stretching at home. Pt reports pain in bilat shoulders -- provided TPDN per her request. Discussed getting shoulder evaluation for PT if needed from her provider.    Personal Factors and Comorbidities Time since onset of injury/illness/exacerbation;Past/Current Experience;Age;Comorbidity 3+    Comorbidities Breast cancer s/p L mastectomy, DM, back pain, HTN, OA, DJD, osteoporosis, diverticulitis    Rehab Potential Good    PT Frequency 2x / week    PT Duration 8 weeks    PT Treatment/Interventions ADLs/Self Care Home Management;Cryotherapy;Electrical Stimulation;Iontophoresis 58m/ml Dexamethasone;Moist Heat;Ultrasound;Gait training;Stair training;Functional mobility training;Therapeutic activities;Therapeutic exercise;Balance training;Neuromuscular re-education;Patient/family education;Manual techniques;Passive range of motion;Dry needling;Taping;Vasopneumatic Device;Spinal Manipulations;Joint Manipulations    PT Next Visit Plan Ready for d/c    PT Home Exercise Plan Access Code: [email or web address](11/8, updated 11/21)    Consulted and Agree with Plan of Care Patient             Patient will benefit from skilled therapeutic intervention in order to improve the following deficits and impairments:  Decreased activity tolerance, Decreased mobility, Decreased range of motion, Decreased strength,  Difficulty walking, Hypomobility, Increased fascial restricitons, Increased muscle spasms, Impaired perceived functional ability, Impaired flexibility, Pain  Visit Diagnosis: Pain in right hip  Chronic pain of right knee  Chronic pain of left knee  Pain in right ankle and joints of right foot  Pain in left ankle and joints of left foot  Other muscle spasm  Muscle weakness (generalized)  Other abnormalities of gait and mobility  Difficulty in walking, not elsewhere classified     Problem List Patient Active Problem List   Diagnosis Date Noted   Greater trochanteric pain syndrome of right lower extremity 05/07/2021   Left lateral epicondylitis 02/28/2017   Left shoulder pain 02/28/2017   Bilateral knee pain 02/25/2017   Breast cancer, left breast ([place] 10/02/2013   Hyperlipidemia 07/26/2013   Chest pain with moderate risk for cardiac etiology 07/06/2013   Headache(784.0) 10/03/2012   Menopause 02/17/2012   Diabetes mellitus ([place] 01/25/2012   PUD (peptic ulcer disease) 01/25/2012   Diverticulitis 07/20/2011   General medical examination 06/18/2011   Back pain 11/25/2010   DEGENERATIVE JOINT DISEASE 01/21/2010   Essential hypertension 08/12/2009   BREAST CANCER, HX OF 08/12/2009    Nature Kueker April MGordy Levan PT, DPT 07/16/2021, 5:25 PM  [facility] [place] [address]Phone: [phone]  Fax:  [phone] Name: RKieryn BurtisMRN: [id number] of Birth: [birth year]

## 2021-07-23 ENCOUNTER — Encounter: Payer: Medicare Other | Admitting: Physical Therapy

## 2021-07-25 ENCOUNTER — Encounter: Payer: Medicare Other | Admitting: Physical Therapy

## 2021-12-09 ENCOUNTER — Emergency Department (HOSPITAL_BASED_OUTPATIENT_CLINIC_OR_DEPARTMENT_OTHER)
Admission: EM | Admit: 2021-12-09 | Discharge: 2021-12-09 | Disposition: A | Discharge status: Home / Self Care | Payer: Medicare Other | Attending: Emergency Medicine | Admitting: Emergency Medicine

## 2021-12-09 ENCOUNTER — Other Ambulatory Visit: Payer: Self-pay

## 2021-12-09 ENCOUNTER — Emergency Department (HOSPITAL_BASED_OUTPATIENT_CLINIC_OR_DEPARTMENT_OTHER): Payer: Medicare Other

## 2021-12-09 ENCOUNTER — Encounter (HOSPITAL_BASED_OUTPATIENT_CLINIC_OR_DEPARTMENT_OTHER): Payer: Self-pay | Admitting: Urology

## 2021-12-09 DIAGNOSIS — Z853 Personal history of malignant neoplasm of breast: Secondary | ICD-10-CM | POA: Insufficient documentation

## 2021-12-09 DIAGNOSIS — Z79899 Other long term (current) drug therapy: Secondary | ICD-10-CM | POA: Insufficient documentation

## 2021-12-09 DIAGNOSIS — I10 Essential (primary) hypertension: Secondary | ICD-10-CM | POA: Diagnosis not present

## 2021-12-09 DIAGNOSIS — J069 Acute upper respiratory infection, unspecified: Secondary | ICD-10-CM | POA: Insufficient documentation

## 2021-12-09 DIAGNOSIS — J019 Acute sinusitis, unspecified: Secondary | ICD-10-CM | POA: Insufficient documentation

## 2021-12-09 DIAGNOSIS — R059 Cough, unspecified: Secondary | ICD-10-CM | POA: Diagnosis present

## 2021-12-09 DIAGNOSIS — Z20822 Contact with and (suspected) exposure to covid-19: Secondary | ICD-10-CM | POA: Diagnosis not present

## 2021-12-09 LAB — RESP PANEL BY RT-PCR (FLU A&B, COVID) ARPGX2
Influenza A by PCR: NEGATIVE
Influenza B by PCR: NEGATIVE
SARS Coronavirus 2 by RT PCR: NEGATIVE

## 2021-12-09 MED ORDER — AMOXICILLIN-POT CLAVULANATE 875-125 MG PO TABS
1.0000 | ORAL_TABLET | Freq: Once | ORAL | Status: AC
  Administered 2021-12-09: 1 via ORAL
  Filled 2021-12-09: qty 1

## 2021-12-09 MED ORDER — AMOXICILLIN-POT CLAVULANATE 875-125 MG PO TABS: 1.0000 | ORAL_TABLET | Freq: Two times a day (BID) | ORAL | 0 refills | Status: DC

## 2021-12-09 MED ORDER — BENZONATATE 100 MG PO CAPS: 100.0000 mg | ORAL_CAPSULE | Freq: Three times a day (TID) | ORAL | 0 refills | Status: DC | PRN

## 2021-12-09 MED ORDER — BENZONATATE 100 MG PO CAPS
200.0000 mg | ORAL_CAPSULE | Freq: Once | ORAL | Status: AC
  Administered 2021-12-09: 200 mg via ORAL
  Filled 2021-12-09: qty 2

## 2021-12-09 NOTE — ED Triage Notes (Signed)
Dry cough since Sunday  ?Reports chest wall and back pain from coughing  ?Also reports stuffy nose  ?Denies fever ?

## 2021-12-09 NOTE — Discharge Instructions (Addendum)
Take next dose of antibiotic tomorrow.  I have also sent in prescription for cough medicine.  However recommend tea and honey as well for your cough. ?

## 2021-12-09 NOTE — ED Provider Notes (Signed)
?[facility] EMERGENCY DEPARTMENT ?Provider Note ? ? ?CSN: [id number] ?Arrival date & time: 12/09/21  1847 ? ?  ? ?History ? ?Chief Complaint  ?Patient presents with  ? Cough  ? ? ?Jillian Chandler is a 71 y.o. female. ? ?The history is provided by the patient.  ?Cough ?Cough characteristics:  Non-productive ?Sputum characteristics:  Nondescript ?Severity:  Mild ?Onset quality:  Gradual ?Duration:  3 days ?Timing:  Intermittent ?Progression:  Waxing and waning ?Chronicity:  New ?Context: upper respiratory infection   ?Relieved by:  Nothing ?Worsened by:  Nothing ?Associated symptoms: sinus congestion   ?Associated symptoms: no chest pain, no chills, no diaphoresis, no ear fullness, no ear pain, no eye discharge, no fever, no headaches, no myalgias, no rash, no rhinorrhea, no shortness of breath, no sore throat, no weight loss and no wheezing   ?Risk factors: no recent infection   ? ?  ? ?Home Medications ?Prior to Admission medications   ?Medication Sig Start Date End Date Taking? Authorizing Provider  ?amoxicillin-clavulanate (AUGMENTIN) 875-125 MG tablet Take 1 tablet by mouth every 12 (twelve) hours. 12/09/21  Yes Xitlali Kastens, DO  ?benzonatate (TESSALON) 100 MG capsule Take 1 capsule (100 mg total) by mouth 3 (three) times daily as needed for cough. 12/09/21  Yes Rahsaan Weakland, DO  ?acetaminophen (TYLENOL) 500 MG tablet Take 1 tablet (500 mg total) by mouth every 6 (six) hours as needed. 09/15/15   Antonietta Breach, PA-C  ?atorvastatin (LIPITOR) 40 MG tablet TAKE 1 TABLET BY MOUTH ONCE DAILY 09/05/18   Saguier, Percell Miller, PA-C  ?b complex vitamins capsule Take 1 capsule by mouth daily.    [provider]  ?Cholecalciferol (VITAMIN D3 PO) Take 3 tablets by mouth daily before breakfast.     [provider]  ?diazepam (VALIUM) 5 MG tablet 1 tab po q hs prn insomnia or anxiety 03/17/19   Saguier, Percell Miller, PA-C  ?diclofenac Sodium (VOLTAREN) 1 % GEL Apply 4 g topically 4 (four) times daily. To affected  joint. 05/07/21   Rosemarie Ax, MD  ?fluticasone Asencion Islam) 50 MCG/ACT nasal spray Place 1 spray into both nostrils daily. 07/30/18   Caccavale, Sophia, PA-C  ?lisinopril-hydrochlorothiazide (ZESTORETIC) 20-25 MG tablet Take 1 tablet by mouth once daily 06/22/19   Saguier, Percell Miller, PA-C  ?meloxicam (MOBIC) 15 MG tablet Take 1 tablet by mouth once daily 03/21/19   Saguier, Percell Miller, PA-C  ?meloxicam (MOBIC) 15 MG tablet Take 1 tablet by mouth daily. 04/17/20   [provider]  ?metoprolol succinate (TOPROL-XL) 50 MG 24 hr tablet TAKE 1 TABLET BY MOUTH ONCE DAILY **TAKE  WITH  OR  IMMEDIATELY  FOLLOWING  A  MEAL 09/23/19   Saguier, Percell Miller, PA-C  ?olmesartan-hydrochlorothiazide (BENICAR HCT) 40-25 MG tablet Take 1 tablet by mouth daily. 04/17/20   [provider]  ?Omega-3 Fatty Acids (OMEGA 3 PO) Take 2 capsules by mouth daily.    [provider]  ?phenol (CHLORASEPTIC) 1.4 % LIQD Use as directed 1 spray in the mouth or throat as needed for throat irritation / pain. 07/30/18   Caccavale, Sophia, PA-C  ?pyridOXINE (VITAMIN B-6) 100 MG tablet Take 100 mg by mouth daily before breakfast.    [provider]  ?QUEtiapine (SEROQUEL) 50 MG tablet Take 1 tablet (50 mg total) by mouth at bedtime. 04/05/19   Saguier, Percell Miller, PA-C  ?rosuvastatin (CRESTOR) 10 MG tablet Take by mouth. 04/17/20   [provider]  ?vitamin E 400 UNIT capsule Take 1,200 Units by mouth daily  before breakfast.     [provider]  ?   ? ?Allergies    ?Patient has no known allergies.   ? ?Review of Systems   ?Review of Systems  ?Constitutional:  Negative for chills, diaphoresis, fever and weight loss.  ?HENT:  Negative for ear pain, rhinorrhea and sore throat.   ?Eyes:  Negative for discharge.  ?Respiratory:  Positive for cough. Negative for shortness of breath and wheezing.   ?Cardiovascular:  Negative for chest pain.  ?Musculoskeletal:  Negative for myalgias.  ?Skin:  Negative for rash.  ?Neurological:   Negative for headaches.  ? ?Physical Exam ?Updated Vital Signs ?BP (!) 156/78   Pulse 79   Temp 98.4 ?F (36.9 ?C) (Oral)   Resp 15   Ht '5\' 4"'$  (1.626 m)   Wt 72 kg   SpO2 97%   BMI 27.25 kg/m?  ?Physical Exam ?Vitals and nursing note reviewed.  ?Constitutional:   ?   General: She is not in acute distress. ?   Appearance: She is well-developed. She is not ill-appearing.  ?HENT:  ?   Head: Normocephalic and atraumatic.  ?   Nose: Congestion present.  ?   Mouth/Throat:  ?   Mouth: Mucous membranes are moist.  ?Eyes:  ?   Extraocular Movements: Extraocular movements intact.  ?   Conjunctiva/sclera: Conjunctivae normal.  ?   Pupils: Pupils are equal, round, and reactive to light.  ?Cardiovascular:  ?   Rate and Rhythm: Normal rate and regular rhythm.  ?   Pulses: Normal pulses.  ?   Heart sounds: Normal heart sounds. No murmur heard. ?Pulmonary:  ?   Effort: Pulmonary effort is normal. No respiratory distress.  ?   Breath sounds: Normal breath sounds.  ?Abdominal:  ?   Palpations: Abdomen is soft.  ?   Tenderness: There is no abdominal tenderness.  ?Musculoskeletal:     ?   General: No swelling.  ?   Cervical back: Normal range of motion and neck supple.  ?Skin: ?   General: Skin is warm and dry.  ?   Capillary Refill: Capillary refill takes less than 2 seconds.  ?Neurological:  ?   General: No focal deficit present.  ?   Mental Status: She is alert.  ?Psychiatric:     ?   Mood and Affect: Mood normal.  ? ? ?ED Results / Procedures / Treatments   ?Labs ?(all labs ordered are listed, but only abnormal results are displayed) ?Labs Reviewed  ?RESP PANEL BY RT-PCR (FLU A&B, COVID) [id number]  ? ? ?EKG ?EKG Interpretation ? ?Date/Time:  Tuesday Dec 09 2021 19:05:15 EDT ?Ventricular Rate:  78 ?PR Interval:  191 ?QRS Duration: 96 ?QT Interval:  364 ?QTC Calculation: 415 ?R Axis:   39 ?Text Interpretation: Sinus rhythm Confirmed by Lennice Sites [phone]) on 12/09/2021 7:19:58 PM ? ?Radiology ?DG Chest Portable 1 View ? ?Result  Date: 12/09/2021 ?CLINICAL DATA:  Cough EXAM: PORTABLE CHEST 1 VIEW COMPARISON:  06/15/2020 FINDINGS: Heart and mediastinal contours are within normal limits. No focal opacities or effusions. No acute bony abnormality. Scattered aortic calcifications. IMPRESSION: No active cardiopulmonary disease. Electronically Signed   By: Rolm Baptise M.D.   On: 12/09/2021 19:21   ? ?Procedures ?Procedures  ? ? ?Medications Ordered in ED ?Medications  ?amoxicillin-clavulanate (AUGMENTIN) 875-125 MG per tablet 1 tablet (has no administration in time range)  ?benzonatate (TESSALON) capsule 200 mg (has no administration in time range)  ? ? ?ED Course/ Medical Decision Making/  A&P ?  ?                        ?Medical Decision Making ?Amount and/or Complexity of Data Reviewed ?Radiology: ordered. ? ?Risk ?Prescription drug management. ? ? ?Whitnee Letizia is here with cough and nasal congestion for the last several days.  History of hypertension, high cholesterol, breast cancer but not undergoing active treatment.  Viral symptoms for the last several days.  Denies any fevers or chills.  Denies any chest pain or shortness of breath.  EKG done in triage per my review and interpretation shows sinus rhythm.  No ischemic changes.  She has had a dry cough no major sputum production.  Overall she appears well.  Clear breath sounds.  Differential diagnosis is viral process versus pneumonia versus allergies.  We will get chest x-ray, COVID and flu test. ? ?Chest x-ray showed no evidence of pneumonia per my review and interpretation.  Overall suspect viral process but could be a bacterial sinusitis.  Will treat with antibiotics.  Will give [employer]  Discharged in good condition. ? ?This chart was dictated using voice recognition software.  Despite best efforts to proofread,  errors can occur which can change the documentation meaning. ? ? ? ? ? ? ? ? ? ?Final Clinical Impression(s) / ED Diagnoses ?Final diagnoses:  ?Acute sinusitis,  recurrence not specified, unspecified location  ?Viral URI with cough  ? ? ?Rx / DC Orders ?ED Discharge Orders   ? ?      Ordered  ?  benzonatate (TESSALON) 100 MG capsule  3 times daily PRN       ? 12/09/21 1929  ?  amo

## 2022-04-15 ENCOUNTER — Other Ambulatory Visit: Payer: Self-pay

## 2022-04-15 ENCOUNTER — Emergency Department (HOSPITAL_BASED_OUTPATIENT_CLINIC_OR_DEPARTMENT_OTHER)
Admission: EM | Admit: 2022-04-15 | Discharge: 2022-04-15 | Disposition: A | Discharge status: Home / Self Care | Payer: Medicare Other | Attending: Emergency Medicine | Admitting: Emergency Medicine

## 2022-04-15 ENCOUNTER — Encounter (HOSPITAL_BASED_OUTPATIENT_CLINIC_OR_DEPARTMENT_OTHER): Payer: Self-pay | Admitting: Pediatrics

## 2022-04-15 ENCOUNTER — Emergency Department (HOSPITAL_BASED_OUTPATIENT_CLINIC_OR_DEPARTMENT_OTHER): Payer: Medicare Other

## 2022-04-15 DIAGNOSIS — Z87891 Personal history of nicotine dependence: Secondary | ICD-10-CM | POA: Insufficient documentation

## 2022-04-15 DIAGNOSIS — E119 Type 2 diabetes mellitus without complications: Secondary | ICD-10-CM | POA: Insufficient documentation

## 2022-04-15 DIAGNOSIS — I10 Essential (primary) hypertension: Secondary | ICD-10-CM | POA: Insufficient documentation

## 2022-04-15 DIAGNOSIS — Z853 Personal history of malignant neoplasm of breast: Secondary | ICD-10-CM | POA: Diagnosis not present

## 2022-04-15 DIAGNOSIS — U071 COVID-19: Secondary | ICD-10-CM | POA: Diagnosis not present

## 2022-04-15 DIAGNOSIS — Z79899 Other long term (current) drug therapy: Secondary | ICD-10-CM | POA: Insufficient documentation

## 2022-04-15 DIAGNOSIS — R059 Cough, unspecified: Secondary | ICD-10-CM | POA: Diagnosis present

## 2022-04-15 LAB — COMPREHENSIVE METABOLIC PANEL
ALT: 20 U/L (ref 0–44)
AST: 19 U/L (ref 15–41)
Albumin: 3.8 g/dL (ref 3.5–5.0)
Alkaline Phosphatase: 99 U/L (ref 38–126)
Anion gap: 8 (ref 5–15)
BUN: 17 mg/dL (ref 8–23)
CO2: 32 mmol/L (ref 22–32)
Calcium: 9.6 mg/dL (ref 8.9–10.3)
Chloride: 100 mmol/L (ref 98–111)
Creatinine, Ser: 0.68 mg/dL (ref 0.44–1.00)
GFR, Estimated: >60 mL/min (ref >60)
Glucose, Bld: 135 mg/dL — ABNORMAL HIGH (ref 70–99)
Potassium: 4.6 mmol/L (ref 3.5–5.1)
Sodium: 140 mmol/L (ref 135–145)
Total Bilirubin: 0.4 mg/dL (ref 0.3–1.2)
Total Protein: 7.5 g/dL (ref 6.5–8.1)

## 2022-04-15 LAB — CBC WITH DIFFERENTIAL/PLATELET
Abs Immature Granulocytes: 0.02 10*3/uL (ref 0.00–0.07)
Basophils Absolute: 0.1 10*3/uL (ref 0.0–0.1)
Basophils Relative: 1 %
Eosinophils Absolute: 0.2 10*3/uL (ref 0.0–0.5)
Eosinophils Relative: 2 %
HCT: 44.2 % (ref 36.0–46.0)
Hemoglobin: 14.2 g/dL (ref 12.0–15.0)
Immature Granulocytes: 0 %
Lymphocytes Relative: 19 %
Lymphs Abs: 1.8 10*3/uL (ref 0.7–4.0)
MCH: 28 pg (ref 26.0–34.0)
MCHC: 32.1 g/dL (ref 30.0–36.0)
MCV: 87.2 fL (ref 80.0–100.0)
Monocytes Absolute: 0.8 10*3/uL (ref 0.1–1.0)
Monocytes Relative: 8 %
Neutro Abs: 6.8 10*3/uL (ref 1.7–7.7)
Neutrophils Relative %: 70 %
Platelets: 281 10*3/uL (ref 150–400)
RBC: 5.07 MIL/uL (ref 3.87–5.11)
RDW: 13.3 % (ref 11.5–15.5)
WBC: 9.6 10*3/uL (ref 4.0–10.5)
nRBC: 0 % (ref 0.0–0.2)

## 2022-04-15 LAB — RESP PANEL BY RT-PCR (FLU A&B, COVID) ARPGX2
Influenza A by PCR: NEGATIVE
Influenza B by PCR: NEGATIVE
SARS Coronavirus 2 by RT PCR: POSITIVE — AB

## 2022-04-15 MED ORDER — BENZONATATE 100 MG PO CAPS
100.0000 mg | ORAL_CAPSULE | Freq: Once | ORAL | Status: AC
  Administered 2022-04-15: 100 mg via ORAL
  Filled 2022-04-15: qty 1

## 2022-04-15 MED ORDER — IBUPROFEN 400 MG PO TABS
600.0000 mg | ORAL_TABLET | Freq: Once | ORAL | Status: AC
  Administered 2022-04-15: 600 mg via ORAL
  Filled 2022-04-15: qty 1

## 2022-04-15 MED ORDER — MOLNUPIRAVIR 200 MG PO CAPS: 4.0000 | ORAL_CAPSULE | Freq: Two times a day (BID) | ORAL | 0 refills | Status: AC

## 2022-04-15 NOTE — ED Provider Notes (Signed)
[facility] EMERGENCY DEPARTMENT Provider Note   CSN: [id number] Arrival date & time: 04/15/22  1454     History  Chief Complaint  Patient presents with   Cough   Shortness of Breath    Jillian Chandler is a 71 y.o. female.   Cough Associated symptoms: shortness of breath   Shortness of Breath Associated symptoms: cough     71 year old female presents emergency department with complaints of cough, sore throat, arthralgias for the past 3 days.  Patient states that symptoms began insidiously.  She denies any known sick contact.  She denies smoking or known pulmonary or cardiac conditions.  She states that symptoms have been persistent since onset.  She has been able to tolerate oral liquids and foods without difficulty.  She notes some associated shortness of breath with coughing episodes but states that she has not been short of breath outside of episodes.  She also notes some accompanying anterior chest pain associated with coughing but not experienced otherwise.  Denies known fever but does states she has had some episodes of feeling cold/chills at home.  She also states that she has had a gradually developing headache over the past 1 to 2 days.  Denies visual disturbance, slurred speech, gait abnormalities, weakness/sensory deficits, facial droop.  Denies abdominal pain, nausea, vomiting, urinary symptoms, change in bowel habits.    Past medical history significant for hypertension, menopause, diabetes mellitus, hyperlipidemia, breast cancer, former cigarette use  Home Medications Prior to Admission medications   Medication Sig Start Date End Date Taking? Authorizing Provider  molnupiravir EUA (LAGEVRIO) 200 MG CAPS capsule Take 4 capsules (800 mg total) by mouth 2 (two) times daily for 5 days. 04/15/22 04/20/22 Yes Dion Saucier A, PA  acetaminophen (TYLENOL) 500 MG tablet Take 1 tablet (500 mg total) by mouth every 6 (six) hours as needed. 09/15/15   Antonietta Breach, PA-C   amoxicillin-clavulanate (AUGMENTIN) 875-125 MG tablet Take 1 tablet by mouth every 12 (twelve) hours. 12/09/21   Curatolo, Adam, DO  atorvastatin (LIPITOR) 40 MG tablet TAKE 1 TABLET BY MOUTH ONCE DAILY 09/05/18   Saguier, Percell Miller, PA-C  b complex vitamins capsule Take 1 capsule by mouth daily.    [provider]  benzonatate (TESSALON) 100 MG capsule Take 1 capsule (100 mg total) by mouth 3 (three) times daily as needed for cough. 12/09/21   Curatolo, Adam, DO  Cholecalciferol (VITAMIN D3 PO) Take 3 tablets by mouth daily before breakfast.     [provider]  diazepam (VALIUM) 5 MG tablet 1 tab po q hs prn insomnia or anxiety 03/17/19   Saguier, Percell Miller, PA-C  diclofenac Sodium (VOLTAREN) 1 % GEL Apply 4 g topically 4 (four) times daily. To affected joint. 05/07/21   Rosemarie Ax, MD  fluticasone (FLONASE) 50 MCG/ACT nasal spray Place 1 spray into both nostrils daily. 07/30/18   Caccavale, Sophia, PA-C  lisinopril-hydrochlorothiazide (ZESTORETIC) 20-25 MG tablet Take 1 tablet by mouth once daily 06/22/19   Saguier, Percell Miller, PA-C  meloxicam [facility]) 15 MG tablet Take 1 tablet by mouth once daily 03/21/19   Saguier, Percell Miller, PA-C  meloxicam (MOBIC) 15 MG tablet Take 1 tablet by mouth daily. 04/17/20   [provider]  metoprolol succinate (TOPROL-XL) 50 MG 24 hr tablet TAKE 1 TABLET BY MOUTH ONCE DAILY **TAKE  WITH  OR  IMMEDIATELY  FOLLOWING  A  MEAL 09/23/19   Saguier, Percell Miller, PA-C  olmesartan-hydrochlorothiazide (BENICAR HCT) 40-25 MG tablet Take 1 tablet by mouth daily.  04/17/20   [provider]  Omega-3 Fatty Acids (OMEGA 3 PO) Take 2 capsules by mouth daily.    [provider]  phenol (CHLORASEPTIC) 1.4 % LIQD Use as directed 1 spray in the mouth or throat as needed for throat irritation / pain. 07/30/18   Caccavale, Sophia, PA-C  pyridOXINE (VITAMIN B-6) 100 MG tablet Take 100 mg by mouth daily before breakfast.    [provider]  QUEtiapine  (SEROQUEL) 50 MG tablet Take 1 tablet (50 mg total) by mouth at bedtime. 04/05/19   Saguier, Percell Miller, PA-C  rosuvastatin (CRESTOR) 10 MG tablet Take by mouth. 04/17/20   [provider]  vitamin E 400 UNIT capsule Take 1,200 Units by mouth daily before breakfast.     [provider]      Allergies    Patient has no known allergies.    Review of Systems   Review of Systems  Respiratory:  Positive for cough and shortness of breath.   All other systems reviewed and are negative.   Physical Exam Updated Vital Signs BP (!) 118/99 (BP Location: Right Arm)   Pulse 96   Temp 98.6 F (37 C)   Resp 20   Ht '5\' 7"'$  (1.702 m)   Wt 77.1 kg   SpO2 98%   BMI 26.63 kg/m  Physical Exam Vitals and nursing note reviewed.  Constitutional:      General: She is not in acute distress.    Appearance: She is well-developed.  HENT:     Head: Normocephalic and atraumatic.     Nose: Congestion present. No rhinorrhea.     Mouth/Throat:     Comments: Mild posterior pharyngeal erythema with no obvious exudate.  Tonsils 1+ bilaterally.  Uvula midline rises symmetrically with phonation.  No submandibular swelling or edema noticed beneath the tongue.  No stridor when auscultating the neck. Eyes:     General:        Right eye: No discharge.        Left eye: No discharge.     Conjunctiva/sclera: Conjunctivae normal.  Neck:     Vascular: No JVD.  Cardiovascular:     Rate and Rhythm: Normal rate and regular rhythm.     Heart sounds: No murmur heard. Pulmonary:     Effort: Pulmonary effort is normal. No respiratory distress.     Breath sounds: Normal breath sounds. No wheezing, rhonchi or rales.  Chest:     Chest wall: No tenderness.  Abdominal:     Palpations: Abdomen is soft.     Tenderness: There is no abdominal tenderness.  Musculoskeletal:        General: No swelling.     Cervical back: Normal range of motion and neck supple. No rigidity or tenderness.     Right lower leg: No  edema.     Left lower leg: No edema.  Skin:    General: Skin is warm and dry.     Capillary Refill: Capillary refill takes less than 2 seconds.  Neurological:     Mental Status: She is alert.     Comments: Alert and oriented to self, place, time and event.   Speech is fluent, clear without dysarthria or dysphasia.   Strength 5/5 in upper/lower extremities   Sensation intact in upper/lower extremities   Normal gait.  No pronator drift.  Normal finger-to-nose and feet tapping.  CN I not tested  CN II grossly intact visual fields bilaterally. Did not visualize posterior eye.  CN III, IV, VI PERRLA and EOMs intact bilaterally  CN V Intact sensation to sharp and light touch to the face  CN VII facial movements symmetric  CN VIII not tested  CN IX, X no uvula deviation, symmetric rise of soft palate  CN XI 5/5 SCM and trapezius strength bilaterally  CN XII Midline tongue protrusion, symmetric L/R movements     Psychiatric:        Mood and Affect: Mood normal.     ED Results / Procedures / Treatments   Labs (all labs ordered are listed, but only abnormal results are displayed) Labs Reviewed  RESP PANEL BY RT-PCR (FLU A&B, COVID) ARPGX2 - Abnormal; Notable for the following components:      Result Value   SARS Coronavirus 2 by RT PCR POSITIVE (*)    All other components within normal limits  COMPREHENSIVE METABOLIC PANEL - Abnormal; Notable for the following components:   Glucose, Bld 135 (*)    All other components within normal limits  CBC WITH DIFFERENTIAL/PLATELET    EKG None  Radiology DG Chest 2 View  Result Date: 04/15/2022 CLINICAL DATA:  Cough, shortness of breath EXAM: CHEST - 2 VIEW COMPARISON:  12/09/2021 FINDINGS: The heart size and mediastinal contours are within normal limits. Both lungs are clear. The visualized skeletal structures are unremarkable. IMPRESSION: No active cardiopulmonary disease. Electronically Signed   By: Elmer Picker M.D.   On:  04/15/2022 15:31    Procedures Procedures    Medications Ordered in ED Medications  ibuprofen (ADVIL) tablet 600 mg (600 mg Oral Given 04/15/22 1613)  benzonatate (TESSALON) capsule 100 mg (100 mg Oral Given 04/15/22 1613)    ED Course/ Medical Decision Making/ A&P                           Medical Decision Making Amount and/or Complexity of Data Reviewed Labs: ordered. Radiology: ordered.  Risk Prescription drug management.   This patient presents to the ED for concern of flulike symptoms, this involves an extensive number of treatment options, and is a complaint that carries with it a high risk of complications and morbidity.  The differential diagnosis includes pneumonia, influenza, meningitis, COVID, URI   Co morbidities that complicate the patient evaluation  See HPI   Additional history obtained:  Additional history obtained from EMR  Lab Tests:  I Ordered, and personally interpreted labs.  The pertinent results include: No electrolyte abnormality.  No transaminitis noted.  Renal function within normal limits.  No leukocytosis noted.  No evidence of anemia or platelet abnormalities.  Respiratory viral panel positive for coronavirus.   Imaging Studies ordered:  I ordered imaging studies including chest x-ray I independently visualized and interpreted imaging which showed no acute abnormalities I agree with the radiologist interpretation   Cardiac Monitoring: / EKG:  The patient was maintained on a cardiac monitor.  I personally viewed and interpreted the cardiac monitored which showed an underlying rhythm of: Sinus rhythm   Consultations Obtained:  N/a   Problem List / ED Course / Critical interventions / Medication management  Coronavirus I ordered medication including Advil and benzonatate for headache and cough suppressant   Reevaluation of the patient after these medicines showed that the patient improved I have reviewed the patients home  medicines and have made adjustments as needed   Social Determinants of Health:  Denies cigarette use currently or illicit drug use.   Test / Admission - Considered:  Coronavirus  Vitals signs  within normal range and stable throughout visit. Laboratory/imaging studies significant for: See above Patient symptoms likely secondary to [place].  She will be treated with molnupiravir for the next 5 days outpatient.  She is not requiring oxygen, afebrile, breathing without difficulty.  Patient educated regarding proper quarantine guidelines.  Symptomatic therapy recommended with Tylenol/ibuprofen.  Close follow-up with PCP recommended in 3 to 5 days for reevaluation.  Treatment plan discussed with patient she knowledge understanding was agreeable to said plan. Worrisome signs and symptoms were discussed with the patient, and the patient acknowledged understanding to return to the ED if noticed. Patient was stable upon discharge.         Final Clinical Impression(s) / ED Diagnoses Final diagnoses:  COVID    Rx / DC Orders ED Discharge Orders          Ordered    molnupiravir EUA (LAGEVRIO) 200 MG CAPS capsule  2 times daily        04/15/22 1650              Wilnette Kales, [place] 04/15/22 1653    Fransico Meadow, MD 04/16/22 1228

## 2022-04-15 NOTE — ED Triage Notes (Signed)
C/O dry cough and shortness of breathe since yesterday; concern for flu like symptoms.

## 2022-04-15 NOTE — Discharge Instructions (Addendum)
Note the work-up today was overall consistent with COVID.  As discussed, I have sent in a medicine to take twice daily called molnupiravir.  Take 800 mg or 4 capsules.  I recommend follow-up with your primary care provider in 5 to 7 days for reevaluation.  Please be careful with social interactions until you are no longer symptomatic.  Please do not hesitate to return to the emergency department with worrisome signs and symptoms we discussed become apparent.

## 2022-05-09 ENCOUNTER — Encounter (HOSPITAL_BASED_OUTPATIENT_CLINIC_OR_DEPARTMENT_OTHER): Payer: Self-pay | Admitting: Emergency Medicine

## 2022-05-09 ENCOUNTER — Emergency Department (HOSPITAL_BASED_OUTPATIENT_CLINIC_OR_DEPARTMENT_OTHER)
Admission: EM | Admit: 2022-05-09 | Discharge: 2022-05-09 | Disposition: A | Discharge status: Home / Self Care | Payer: Medicare Other | Attending: Emergency Medicine | Admitting: Emergency Medicine

## 2022-05-09 DIAGNOSIS — Z853 Personal history of malignant neoplasm of breast: Secondary | ICD-10-CM | POA: Diagnosis not present

## 2022-05-09 DIAGNOSIS — R42 Dizziness and giddiness: Secondary | ICD-10-CM | POA: Diagnosis not present

## 2022-05-09 DIAGNOSIS — E119 Type 2 diabetes mellitus without complications: Secondary | ICD-10-CM | POA: Diagnosis not present

## 2022-05-09 DIAGNOSIS — M25511 Pain in right shoulder: Secondary | ICD-10-CM | POA: Diagnosis not present

## 2022-05-09 DIAGNOSIS — I10 Essential (primary) hypertension: Secondary | ICD-10-CM | POA: Diagnosis present

## 2022-05-09 NOTE — ED Provider Notes (Signed)
[facility] EMERGENCY DEPARTMENT Provider Note   CSN: [id number] Arrival date & time: 05/09/22  1337     History  Chief Complaint  Patient presents with   Hypertension    Jillian Chandler is a 71 y.o. female with past medical history significant for hypertension, previous breast cancer, hyperlipidemia, diabetes who presents with concern for isolated elevated blood pressure today despite normal medication usage.  Patient reports that she is taking metoprolol 50 mg, as well as some combination pill, she cannot remember and did not bring her medications with her, but has a documented Benicar hydrochlorothiazide, and lisinopril hydrochlorothiazide in the past.  Patient denies any chest pain, shortness of breath, vision changes, double vision, headache, weakness, numbness.  Patient denies any previous history of heart attack, stroke.  Patient reports that she had some isolated right shoulder pain that she felt was muscular in nature, but is not associated with exertion, not associated with her elevated blood pressure.  Patient reports that she felt a little fullness or whistling sensation in the left ear and some brief tearing of the left eye but did not have any changes of her vision.  She denies any cough, sore throat, runny nose.  She denies any significant pain or recent stress.  On arrival patient blood pressure is somewhat improved to 167/73 from reported max of systolic 017, when I walked into the room patient's blood pressure 158/80.  She reports that the refill ear ringing and eye watering sensation as well as minor dizziness she experienced is resolved at this time.   Hypertension       Home Medications Prior to Admission medications   Medication Sig Start Date End Date Taking? Authorizing Provider  acetaminophen (TYLENOL) 500 MG tablet Take 1 tablet (500 mg total) by mouth every 6 (six) hours as needed. 09/15/15   Antonietta Breach, PA-C  amoxicillin-clavulanate (AUGMENTIN)  875-125 MG tablet Take 1 tablet by mouth every 12 (twelve) hours. 12/09/21   Curatolo, Adam, DO  atorvastatin (LIPITOR) 40 MG tablet TAKE 1 TABLET BY MOUTH ONCE DAILY 09/05/18   Saguier, Percell Miller, PA-C  b complex vitamins capsule Take 1 capsule by mouth daily.    [provider]  benzonatate (TESSALON) 100 MG capsule Take 1 capsule (100 mg total) by mouth 3 (three) times daily as needed for cough. 12/09/21   Curatolo, Adam, DO  Cholecalciferol (VITAMIN D3 PO) Take 3 tablets by mouth daily before breakfast.     [provider]  diazepam (VALIUM) 5 MG tablet 1 tab po q hs prn insomnia or anxiety 03/17/19   Saguier, Percell Miller, PA-C  diclofenac Sodium (VOLTAREN) 1 % GEL Apply 4 g topically 4 (four) times daily. To affected joint. 05/07/21   Rosemarie Ax, MD  fluticasone (FLONASE) 50 MCG/ACT nasal spray Place 1 spray into both nostrils daily. 07/30/18   Caccavale, Sophia, PA-C  lisinopril-hydrochlorothiazide (ZESTORETIC) 20-25 MG tablet Take 1 tablet by mouth once daily 06/22/19   Saguier, Percell Miller, PA-C  meloxicam [facility]) 15 MG tablet Take 1 tablet by mouth once daily 03/21/19   Saguier, Percell Miller, PA-C  meloxicam (MOBIC) 15 MG tablet Take 1 tablet by mouth daily. 04/17/20   [provider]  metoprolol succinate (TOPROL-XL) 50 MG 24 hr tablet TAKE 1 TABLET BY MOUTH ONCE DAILY **TAKE  WITH  OR  IMMEDIATELY  FOLLOWING  A  MEAL 09/23/19   Saguier, Percell Miller, PA-C  olmesartan-hydrochlorothiazide (BENICAR HCT) 40-25 MG tablet Take 1 tablet by mouth daily. 04/17/20   [provider]  Omega-3 Fatty Acids (OMEGA 3 PO) Take 2 capsules by mouth daily.    [provider]  phenol (CHLORASEPTIC) 1.4 % LIQD Use as directed 1 spray in the mouth or throat as needed for throat irritation / pain. 07/30/18   Caccavale, Sophia, PA-C  pyridOXINE (VITAMIN B-6) 100 MG tablet Take 100 mg by mouth daily before breakfast.    [provider]  QUEtiapine (SEROQUEL) 50 MG tablet Take 1 tablet (50 mg  total) by mouth at bedtime. 04/05/19   Saguier, Percell Miller, PA-C  rosuvastatin (CRESTOR) 10 MG tablet Take by mouth. 04/17/20   [provider]  vitamin E 400 UNIT capsule Take 1,200 Units by mouth daily before breakfast.     [provider]      Allergies    Patient has no known allergies.    Review of Systems   Review of Systems  All other systems reviewed and are negative.   Physical Exam Updated Vital Signs BP (!) 167/73 (BP Location: Right Arm)   Pulse 67   Temp 98.1 F (36.7 C) (Oral)   Resp 17   Ht '5\' 7"'$  (1.702 m)   Wt 76.2 kg   SpO2 97%   BMI 26.31 kg/m  Physical Exam Vitals and nursing note reviewed.  Constitutional:      General: She is not in acute distress.    Appearance: Normal appearance.  HENT:     Head: Normocephalic and atraumatic.     Right Ear: Tympanic membrane normal.     Left Ear: Tympanic membrane normal.  Eyes:     General:        Right eye: No discharge.        Left eye: No discharge.  Cardiovascular:     Rate and Rhythm: Normal rate and regular rhythm.  Pulmonary:     Effort: Pulmonary effort is normal. No respiratory distress.  Musculoskeletal:        General: No deformity.  Skin:    General: Skin is warm and dry.  Neurological:     Mental Status: She is alert and oriented to person, place, and time.  Psychiatric:        Mood and Affect: Mood normal.        Behavior: Behavior normal.     ED Results / Procedures / Treatments   Labs (all labs ordered are listed, but only abnormal results are displayed) Labs Reviewed - No data to display  EKG EKG Interpretation  Date/Time:  Saturday May 09 2022 15:33:04 EDT Ventricular Rate:  61 PR Interval:  194 QRS Duration: 98 QT Interval:  417 QTC Calculation: 420 R Axis:   28 Text Interpretation: Sinus rhythm since last tracing no significant change Confirmed by Malvin Johns [phone]) on 05/09/2022 3:35:44 PM  Radiology No results found.  Procedures Procedures     Medications Ordered in ED Medications - No data to display  ED Course/ Medical Decision Making/ A&P Clinical Course as of 05/09/22 1548  Sat May 09, 2022  1525 158/80 when I walked into room [CP]    Clinical Course User Index [CP] Anselmo Pickler, PA-C                           Medical Decision Making  This is an overall well appearing 71 year old female with past medical history significant for hypertension, hyperlipidemia, diabetes, previous breast cancer who presents with concern for isolated elevated blood pressure.  She does  not have any active chest pain, shortness of breath, exertional symptoms, she reports 1 brief episode of lightheadedness when she first took her elevated blood pressure which is since resolved.  She denies any difficulty walking, weakness, numbness.  I independently interpreted an EKG which shows normal sinus rhythm, my attending Dr. Tamera Punt independently interpreted the EKG and saw no significant change from previous baseline NSR.  No evidence of ST/T changes or other signs of ischemia.  Discussed with patient that I would not recommend further work-up for isolated elevated blood pressure at this time, and blood pressure is somewhat improved from her initial report.  Discussed I recommend close follow-up with PCP, and adjustment of blood pressure medication as needed if remaining elevated despite taking medication as prescribed.  Patient understands and agrees to plan, remained symptom-free at the time of reevaluation, and is discharged in stable condition at this time.  Extensive return precautions given. Final Clinical Impression(s) / ED Diagnoses Final diagnoses:  Hypertension, unspecified type    Rx / DC Orders ED Discharge Orders     None         Anselmo Pickler, PA-C 05/09/22 1548    Malvin Johns, MD 05/09/22 1610

## 2022-05-09 NOTE — ED Triage Notes (Signed)
Pt c/o elevated BP today; taking medications as directed; denies other sxs

## 2022-05-09 NOTE — ED Notes (Signed)
Patient discharge was done with [facility] [id number] spanish interper

## 2022-05-09 NOTE — Discharge Instructions (Signed)
Please follow-up with your primary care doctor to discuss adjustment of your blood pressure medication if you continue to have isolated readings despite taking her medication as described.  Please return to the emergency department if you have high blood pressure in the context of chest pain, shortness of breath, vision changes, numbness, weakness, persistent dizziness.

## 2022-05-21 ENCOUNTER — Emergency Department (HOSPITAL_COMMUNITY): Payer: Medicare Other

## 2022-05-21 ENCOUNTER — Emergency Department (HOSPITAL_COMMUNITY)
Admission: EM | Admit: 2022-05-21 | Discharge: 2022-05-21 | Disposition: A | Discharge status: Home / Self Care | Payer: Medicare Other | Attending: Emergency Medicine | Admitting: Emergency Medicine

## 2022-05-21 ENCOUNTER — Other Ambulatory Visit: Payer: Self-pay

## 2022-05-21 DIAGNOSIS — R110 Nausea: Secondary | ICD-10-CM | POA: Diagnosis not present

## 2022-05-21 DIAGNOSIS — R109 Unspecified abdominal pain: Secondary | ICD-10-CM | POA: Diagnosis present

## 2022-05-21 DIAGNOSIS — Z853 Personal history of malignant neoplasm of breast: Secondary | ICD-10-CM | POA: Diagnosis not present

## 2022-05-21 DIAGNOSIS — Z79899 Other long term (current) drug therapy: Secondary | ICD-10-CM | POA: Diagnosis not present

## 2022-05-21 DIAGNOSIS — I10 Essential (primary) hypertension: Secondary | ICD-10-CM | POA: Insufficient documentation

## 2022-05-21 DIAGNOSIS — K760 Fatty (change of) liver, not elsewhere classified: Secondary | ICD-10-CM | POA: Insufficient documentation

## 2022-05-21 DIAGNOSIS — R1013 Epigastric pain: Secondary | ICD-10-CM

## 2022-05-21 LAB — TROPONIN I (HIGH SENSITIVITY)
Troponin I (High Sensitivity): 2 ng/L (ref <18)
Troponin I (High Sensitivity): 3 ng/L (ref <18)

## 2022-05-21 LAB — CBC WITH DIFFERENTIAL/PLATELET
Abs Immature Granulocytes: 0.01 10*3/uL (ref 0.00–0.07)
Basophils Absolute: 0.1 10*3/uL (ref 0.0–0.1)
Basophils Relative: 1 %
Eosinophils Absolute: 0.1 10*3/uL (ref 0.0–0.5)
Eosinophils Relative: 1 %
HCT: 47.8 % — ABNORMAL HIGH (ref 36.0–46.0)
Hemoglobin: 15.3 g/dL — ABNORMAL HIGH (ref 12.0–15.0)
Immature Granulocytes: 0 %
Lymphocytes Relative: 30 %
Lymphs Abs: 2.2 10*3/uL (ref 0.7–4.0)
MCH: 28.1 pg (ref 26.0–34.0)
MCHC: 32 g/dL (ref 30.0–36.0)
MCV: 87.9 fL (ref 80.0–100.0)
Monocytes Absolute: 0.5 10*3/uL (ref 0.1–1.0)
Monocytes Relative: 7 %
Neutro Abs: 4.6 10*3/uL (ref 1.7–7.7)
Neutrophils Relative %: 61 %
Platelets: 328 10*3/uL (ref 150–400)
RBC: 5.44 MIL/uL — ABNORMAL HIGH (ref 3.87–5.11)
RDW: 13.2 % (ref 11.5–15.5)
WBC: 7.3 10*3/uL (ref 4.0–10.5)
nRBC: 0 % (ref 0.0–0.2)

## 2022-05-21 LAB — COMPREHENSIVE METABOLIC PANEL
ALT: 16 U/L (ref 0–44)
AST: 16 U/L (ref 15–41)
Albumin: 4.3 g/dL (ref 3.5–5.0)
Alkaline Phosphatase: 81 U/L (ref 38–126)
Anion gap: 9 (ref 5–15)
BUN: 10 mg/dL (ref 8–23)
CO2: 26 mmol/L (ref 22–32)
Calcium: 10 mg/dL (ref 8.9–10.3)
Chloride: 105 mmol/L (ref 98–111)
Creatinine, Ser: 0.48 mg/dL (ref 0.44–1.00)
GFR, Estimated: >60 mL/min (ref >60)
Glucose, Bld: 117 mg/dL — ABNORMAL HIGH (ref 70–99)
Potassium: 3.7 mmol/L (ref 3.5–5.1)
Sodium: 140 mmol/L (ref 135–145)
Total Bilirubin: 0.6 mg/dL (ref 0.3–1.2)
Total Protein: 8.2 g/dL — ABNORMAL HIGH (ref 6.5–8.1)

## 2022-05-21 LAB — URINALYSIS, ROUTINE W REFLEX MICROSCOPIC
Bilirubin Urine: NEGATIVE
Glucose, UA: NEGATIVE mg/dL
Ketones, ur: 20 mg/dL — AB
Leukocytes,Ua: NEGATIVE
Nitrite: NEGATIVE
Protein, ur: NEGATIVE mg/dL
Specific Gravity, Urine: 1.017 (ref 1.005–1.030)
pH: 5 (ref 5.0–8.0)

## 2022-05-21 LAB — LIPASE, BLOOD: Lipase: 51 U/L (ref 11–51)

## 2022-05-21 LAB — D-DIMER, QUANTITATIVE: D-Dimer, Quant: 0.37 ug/mL-FEU (ref 0.00–0.50)

## 2022-05-21 MED ORDER — ONDANSETRON HCL 4 MG/2ML IJ SOLN
4.0000 mg | Freq: Once | INTRAMUSCULAR | Status: AC
  Administered 2022-05-21: 4 mg via INTRAVENOUS
  Filled 2022-05-21: qty 2

## 2022-05-21 MED ORDER — FENTANYL CITRATE PF 50 MCG/ML IJ SOSY
50.0000 ug | PREFILLED_SYRINGE | Freq: Once | INTRAMUSCULAR | Status: AC
  Administered 2022-05-21: 50 ug via INTRAVENOUS
  Filled 2022-05-21: qty 1

## 2022-05-21 MED ORDER — SODIUM CHLORIDE 0.9 % IV BOLUS
1000.0000 mL | Freq: Once | INTRAVENOUS | Status: AC
  Administered 2022-05-21: 1000 mL via INTRAVENOUS

## 2022-05-21 MED ORDER — OMEPRAZOLE 20 MG PO CPDR: 20.0000 mg | DELAYED_RELEASE_CAPSULE | Freq: Every day | ORAL | 0 refills | Status: DC

## 2022-05-21 MED ORDER — ALUM & MAG HYDROXIDE-SIMETH 200-200-20 MG/5ML PO SUSP
30.0000 mL | Freq: Once | ORAL | Status: AC
  Administered 2022-05-21: 30 mL via ORAL
  Filled 2022-05-21: qty 30

## 2022-05-21 MED ORDER — IOHEXOL 300 MG/ML  SOLN
100.0000 mL | Freq: Once | INTRAMUSCULAR | Status: AC | PRN
  Administered 2022-05-21: 100 mL via INTRAVENOUS

## 2022-05-21 MED ORDER — SODIUM CHLORIDE (PF) 0.9 % IJ SOLN
INTRAMUSCULAR | Status: AC
  Filled 2022-05-21: qty 50

## 2022-05-21 MED ORDER — PANTOPRAZOLE SODIUM 40 MG IV SOLR
40.0000 mg | Freq: Once | INTRAVENOUS | Status: AC
  Administered 2022-05-21: 40 mg via INTRAVENOUS
  Filled 2022-05-21: qty 10

## 2022-05-21 NOTE — ED Provider Triage Note (Signed)
Emergency Medicine Provider Triage Evaluation Note  Jillian Chandler , a 71 y.o. female  was evaluated in triage.  Pt complains of epigastric, right upper quadrant abdominal pain that radiates to her mid back since last night..  Review of Systems  Positive: Abdominal pain, nausea, chest pain Negative: Vomiting, fever  Physical Exam  BP (!) 145/81 (BP Location: Left Arm)   Pulse 80   Temp 98.6 F (37 C) (Oral)   Resp 16   SpO2 95%  Gen:   Awake, no distress   Resp:  Normal effort  MSK:   Moves extremities without difficulty  Other:  Tender right upper quadrant epigastrium.  Medical Decision Making  Medically screening exam initiated at 10:02 AM.  Appropriate orders placed.  Jillian Chandler was informed that the remainder of the evaluation will be completed by another provider, this initial triage assessment does not replace that evaluation, and the importance of remaining in the ED until their evaluation is complete.  EKG, labs, likely right upper quadrant ultrasound.   Jillian Essex, MD 05/21/22 1003

## 2022-05-21 NOTE — ED Provider Notes (Signed)
[facility] DEPT Provider Note   CSN: [id number] Arrival date & time: 05/21/22  0931     History  Chief Complaint  Patient presents with   Abdominal Pain    Jillian Chandler is a 71 y.o. female.  Level 5 caveat for language barrier.  Translator is used.  Patient with a history of breast cancer, hypertension, gastric ulcer presenting with mid abdominal pain that radiates to her back that started about 5 PM last night.  The pain is constant and progressively worsening.  Not improving with Tylenol.  Nausea but no vomiting.  No black or bloody stools.  Did feel some bloating earlier and thought she was constipated but this has improved.  No fever.  No chest pain or shortness of breath.  Pain feels similar to when she was diagnosed with an ulcer many years ago but did not require surgery.  She reports having EGD remotely that diagnosed her with an ulcer.  Still has appendix and gallbladder.  Still has uterus and ovaries.  No pain with urination or blood in the urine.  The history is provided by the patient. The history is limited by a language barrier. A language interpreter was used.  Abdominal Pain Associated symptoms: nausea   Associated symptoms: no dysuria, no fever, no hematuria and no vomiting        Home Medications Prior to Admission medications   Medication Sig Start Date End Date Taking? Authorizing Provider  acetaminophen (TYLENOL) 500 MG tablet Take 1 tablet (500 mg total) by mouth every 6 (six) hours as needed. 09/15/15   Antonietta Breach, PA-C  amoxicillin-clavulanate (AUGMENTIN) 875-125 MG tablet Take 1 tablet by mouth every 12 (twelve) hours. 12/09/21   Curatolo, Adam, DO  atorvastatin (LIPITOR) 40 MG tablet TAKE 1 TABLET BY MOUTH ONCE DAILY 09/05/18   Saguier, Percell Miller, PA-C  b complex vitamins capsule Take 1 capsule by mouth daily.    [provider]  benzonatate (TESSALON) 100 MG capsule Take 1 capsule (100 mg total) by mouth 3 (three) times  daily as needed for cough. 12/09/21   Curatolo, Adam, DO  Cholecalciferol (VITAMIN D3 PO) Take 3 tablets by mouth daily before breakfast.     [provider]  diazepam (VALIUM) 5 MG tablet 1 tab po q hs prn insomnia or anxiety 03/17/19   Saguier, Percell Miller, PA-C  diclofenac Sodium (VOLTAREN) 1 % GEL Apply 4 g topically 4 (four) times daily. To affected joint. 05/07/21   Rosemarie Ax, MD  fluticasone (FLONASE) 50 MCG/ACT nasal spray Place 1 spray into both nostrils daily. 07/30/18   Caccavale, Sophia, PA-C  lisinopril-hydrochlorothiazide (ZESTORETIC) 20-25 MG tablet Take 1 tablet by mouth once daily 06/22/19   Saguier, Percell Miller, PA-C  meloxicam [facility]) 15 MG tablet Take 1 tablet by mouth once daily 03/21/19   Saguier, Percell Miller, PA-C  meloxicam (MOBIC) 15 MG tablet Take 1 tablet by mouth daily. 04/17/20   [provider]  metoprolol succinate (TOPROL-XL) 50 MG 24 hr tablet TAKE 1 TABLET BY MOUTH ONCE DAILY **TAKE  WITH  OR  IMMEDIATELY  FOLLOWING  A  MEAL 09/23/19   Saguier, Percell Miller, PA-C  olmesartan-hydrochlorothiazide (BENICAR HCT) 40-25 MG tablet Take 1 tablet by mouth daily. 04/17/20   [provider]  Omega-3 Fatty Acids (OMEGA 3 PO) Take 2 capsules by mouth daily.    [provider]  phenol (CHLORASEPTIC) 1.4 % LIQD Use as directed 1 spray in the mouth or throat as needed for throat irritation /  pain. 07/30/18   Caccavale, Sophia, PA-C  pyridOXINE (VITAMIN B-6) 100 MG tablet Take 100 mg by mouth daily before breakfast.    [provider]  QUEtiapine (SEROQUEL) 50 MG tablet Take 1 tablet (50 mg total) by mouth at bedtime. 04/05/19   Saguier, Percell Miller, PA-C  rosuvastatin (CRESTOR) 10 MG tablet Take by mouth. 04/17/20   [provider]  vitamin E 400 UNIT capsule Take 1,200 Units by mouth daily before breakfast.     [provider]      Allergies    Patient has no known allergies.    Review of Systems   Review of Systems  Constitutional:   Negative for activity change, appetite change and fever.  Respiratory:  Negative for chest tightness.   Gastrointestinal:  Positive for abdominal pain and nausea. Negative for vomiting.  Genitourinary:  Negative for dysuria and hematuria.  Musculoskeletal:  Positive for back pain.  Skin:  Negative for rash.  Neurological:  Negative for weakness and headaches.   all other systems are negative except as noted in the HPI and PMH.    Physical Exam Updated Vital Signs BP (!) 164/92   Pulse 80   Temp 98.6 F (37 C) (Oral)   Resp 20   SpO2 97%  Physical Exam Vitals and nursing note reviewed.  Constitutional:      General: She is not in acute distress.    Appearance: She is well-developed. She is not ill-appearing.  HENT:     Head: Normocephalic and atraumatic.     Mouth/Throat:     Pharynx: No oropharyngeal exudate.  Eyes:     Conjunctiva/sclera: Conjunctivae normal.     Pupils: Pupils are equal, round, and reactive to light.  Neck:     Comments: No meningismus. Cardiovascular:     Rate and Rhythm: Normal rate and regular rhythm.     Heart sounds: Normal heart sounds. No murmur heard. Pulmonary:     Effort: Pulmonary effort is normal. No respiratory distress.     Breath sounds: Normal breath sounds.  Abdominal:     Palpations: Abdomen is soft.     Tenderness: There is abdominal tenderness. There is guarding. There is no rebound.     Comments: Epigastric and right upper quadrant tenderness with voluntary guarding.  Musculoskeletal:        General: No tenderness. Normal range of motion.     Cervical back: Normal range of motion and neck supple.  Skin:    General: Skin is warm.  Neurological:     Mental Status: She is alert and oriented to person, place, and time.     Cranial Nerves: No cranial nerve deficit.     Motor: No abnormal muscle tone.     Coordination: Coordination normal.     Comments:  5/5 strength throughout. CN 2-12 intact.Equal grip strength.   Psychiatric:         Behavior: Behavior normal.     ED Results / Procedures / Treatments   Labs (all labs ordered are listed, but only abnormal results are displayed) Labs Reviewed  CBC WITH DIFFERENTIAL/PLATELET - Abnormal; Notable for the following components:      Result Value   RBC 5.44 (*)    Hemoglobin 15.3 (*)    HCT 47.8 (*)    All other components within normal limits  COMPREHENSIVE METABOLIC PANEL - Abnormal; Notable for the following components:   Glucose, Bld 117 (*)    Total Protein 8.2 (*)    All  other components within normal limits  URINALYSIS, ROUTINE W REFLEX MICROSCOPIC - Abnormal; Notable for the following components:   Color, Urine STRAW (*)    Hgb urine dipstick SMALL (*)    Ketones, ur 20 (*)    Bacteria, UA RARE (*)    All other components within normal limits  LIPASE, BLOOD  D-DIMER, QUANTITATIVE  TROPONIN I (HIGH SENSITIVITY)  TROPONIN I (HIGH SENSITIVITY)    EKG EKG Interpretation  Date/Time:  Thursday May 21 2022 10:58:57 EDT Ventricular Rate:  78 PR Interval:  178 QRS Duration: 104 QT Interval:  379 QTC Calculation: 432 R Axis:   49 Text Interpretation: Sinus rhythm No significant change was found Confirmed by Ezequiel Essex [phone]) on 05/21/2022 11:04:03 AM  Radiology CT ABDOMEN PELVIS W CONTRAST  Result Date: 05/21/2022 CLINICAL DATA:  Abdominal pain EXAM: CT ABDOMEN AND PELVIS WITH CONTRAST TECHNIQUE: Multidetector CT imaging of the abdomen and pelvis was performed using the standard protocol following bolus administration of intravenous contrast. RADIATION DOSE REDUCTION: This exam was performed according to the departmental dose-optimization program which includes automated exposure control, adjustment of the mA and/or kV according to patient size and/or use of iterative reconstruction technique. CONTRAST:  159m OMNIPAQUE IOHEXOL 300 MG/ML  SOLN COMPARISON:  07/17/2011 FINDINGS: Lower chest: There is previous left mastectomy. Heart is enlarged  in size. Visualized lower lung fields are clear. Hepatobiliary: There is fatty infiltration in liver. Small calcified granuloma is seen in the right lobe with no change. There is no dilation of bile ducts. Gallbladder is unremarkable. Pancreas: No focal abnormalities are seen. Spleen: Unremarkable. Adrenals/Urinary Tract: Adrenals are unremarkable. There is no hydronephrosis. There is 7 mm calculus in the midportion of left kidney. There is 2.4 cm smooth marginated lesion in the lateral margin of lower pole of left kidney. Density measurements are higher than usual for simple cyst. This finding has not changed since 07/17/2011. Ureters are not dilated. Urinary bladder is unremarkable. Stomach/Bowelstomach is unremarkable. Small bowel loops are not dilated. Appendix is not dilated. Scattered diverticula are seen in colon. There are no signs of focal acute diverticulitis. Vascular/Lymphatic: Scattered arterial calcifications are seen. Reproductive: Unremarkable. Other: There is no ascites or pneumoperitoneum. Small umbilical hernia containing fat is seen. Musculoskeletal: Degenerative changes are noted in lumbar spine. Degenerative changes are noted in both hips, more so on the left side. There is low-density in the body of L4 vertebra with coarse trabeculae suggesting possible hemangioma. This finding has not changed significantly. IMPRESSION: There is no evidence of intestinal obstruction or pneumoperitoneum. Appendix is not dilated. There is no hydronephrosis. Fatty liver. 7 mm left renal calculus. Diverticulosis of colon without signs of diverticulitis.There is 2.4 cm smooth marginated lesion in the lateral margin of lower pole of left kidney. Density measurements are higher than usual for simple cysts. This finding has not changed significantly suggesting cyst with proteinaceous fluid. Other findings as described in the body of the report. Electronically Signed   By: PElmer PickerM.D.   On: 05/21/2022  16:31   UK[place]Abdomen Limited RUQ (LIVER/GB)  Result Date: 05/21/2022 CLINICAL DATA:  Right upper quadrant abdominal pain EXAM: ULTRASOUND ABDOMEN LIMITED RIGHT UPPER QUADRANT COMPARISON:  01/12/2007 FINDINGS: Gallbladder: Gallstones: None Sludge: None Gallbladder Wall: Within normal limits Pericholecystic fluid: None Sonographic Murphy's Sign: Negative per technologist Common bile duct: Diameter: 6 mm Liver: Parenchymal echogenicity: Diffusely increased with sparing along gallbladder fossa. Contours: Normal Lesions: None Portal vein: Patent.  Hepatopetal flow Other: None. IMPRESSION: Diffuse increased echogenicity  of the hepatic parenchyma is a nonspecific indicator of hepatocellular dysfunction, most commonly steatosis. Electronically Signed   By: Miachel Roux M.D.   On: 05/21/2022 11:44   DG Chest 2 View  Result Date: 05/21/2022 CLINICAL DATA:  Upper abdominal pain radiating to the back and LEFT side for 1 day. EXAM: CHEST - 2 VIEW COMPARISON:  June 15, 2020 and April 15, 2022 FINDINGS: Cardiomediastinal contours and hilar structures are stable. Lungs are clear.  No pneumothorax. On limited assessment no acute skeletal process. IMPRESSION: No active cardiopulmonary disease. Electronically Signed   By: Zetta Bills M.D.   On: 05/21/2022 10:23    Procedures Procedures    Medications Ordered in ED Medications  alum & mag hydroxide-simeth (MAALOX/MYLANTA) 200-200-20 MG/5ML suspension 30 mL (has no administration in time range)  pantoprazole (PROTONIX) injection 40 mg (has no administration in time range)  fentaNYL (SUBLIMAZE) injection 50 mcg (has no administration in time range)  ondansetron (ZOFRAN) injection 4 mg (has no administration in time range)  sodium chloride 0.9 % bolus 1,000 mL (has no administration in time range)    ED Course/ Medical Decision Making/ A&P                           Medical Decision Making Amount and/or Complexity of Data Reviewed Labs: ordered.  Decision-making details documented in ED Course. Radiology: ordered and independent interpretation performed. Decision-making details documented in ED Course. ECG/medicine tests: ordered and independent interpretation performed. Decision-making details documented in ED Course.  Risk OTC drugs. Prescription drug management.  Upper abdominal pain radiating to back since 5 PM with nausea.  No peritoneal signs.  EKG shows no acute ischemia.  Patient given PPI and antiemetics.  No leukocytosis.  LFTs and lipase are normal.  Ultrasound done in triage shows hepatic steatosis but no gallstones.  D-dimer is negative.  Low suspicion for ACS, pulmonary embolism, or aortic dissection.  His pain is improved with GI cocktail and IV Protonix.  Troponin negative x2 with low suspicion for ACS.  D-dimer is negative or low suspicion for pulmonary embolism or aortic dissection.  CT scan with with no acute findings.  Does show 7 mm kidney stone as well as renal cyst which patient is made aware of  Suspect pain likely gastritis or esophagitis in origin.  Possibly ulcer both disease.  Will initiate PPI, avoid alcohol, caffeine, NSAID medications, spicy foods, follow-up with PCP as well as gastroenterology for consideration of EGD.  Return precautions discussed.        Final Clinical Impression(s) / ED Diagnoses Final diagnoses:  Epigastric abdominal pain    Rx / DC Orders ED Discharge Orders     None         Trayton Szabo, Annie Main, MD 05/21/22 1700

## 2022-05-21 NOTE — ED Triage Notes (Signed)
Pt reports upper abd pain radiating to back pain and left side x1 day.

## 2022-05-21 NOTE — Discharge Instructions (Addendum)
Your testing is reassuring.  No evidence of heart attack or blood clot in the lung.  Take the stomach medication as prescribed.  Avoid alcohol, caffeine, NSAID medications, spicy foods.  Follow-up with your primary doctor as well as a gastroenterologist for an EGD study to check for new ulcers.  Return to the ED with new or worsening symptoms

## 2022-05-26 ENCOUNTER — Encounter (HOSPITAL_COMMUNITY): Payer: Self-pay | Admitting: Emergency Medicine

## 2022-05-26 ENCOUNTER — Other Ambulatory Visit: Payer: Self-pay

## 2022-05-26 ENCOUNTER — Emergency Department (HOSPITAL_COMMUNITY): Payer: Medicare Other

## 2022-05-26 ENCOUNTER — Emergency Department (HOSPITAL_COMMUNITY)
Admission: EM | Admit: 2022-05-26 | Discharge: 2022-05-26 | Disposition: A | Discharge status: Home / Self Care | Payer: Medicare Other | Attending: Emergency Medicine | Admitting: Emergency Medicine

## 2022-05-26 DIAGNOSIS — S93401A Sprain of unspecified ligament of right ankle, initial encounter: Secondary | ICD-10-CM | POA: Diagnosis not present

## 2022-05-26 DIAGNOSIS — Y92009 Unspecified place in unspecified non-institutional (private) residence as the place of occurrence of the external cause: Secondary | ICD-10-CM | POA: Insufficient documentation

## 2022-05-26 DIAGNOSIS — W010XXA Fall on same level from slipping, tripping and stumbling without subsequent striking against object, initial encounter: Secondary | ICD-10-CM | POA: Diagnosis not present

## 2022-05-26 DIAGNOSIS — S99911A Unspecified injury of right ankle, initial encounter: Secondary | ICD-10-CM | POA: Diagnosis present

## 2022-05-26 DIAGNOSIS — S93601A Unspecified sprain of right foot, initial encounter: Secondary | ICD-10-CM | POA: Diagnosis not present

## 2022-05-26 MED ORDER — OXYCODONE HCL 5 MG PO TABS: 5.0000 mg | ORAL_TABLET | ORAL | 0 refills | Status: DC | PRN

## 2022-05-26 MED ORDER — MORPHINE SULFATE (PF) 4 MG/ML IV SOLN
4.0000 mg | Freq: Once | INTRAVENOUS | Status: AC
  Administered 2022-05-26: 4 mg via INTRAMUSCULAR
  Filled 2022-05-26: qty 1

## 2022-05-26 NOTE — ED Provider Notes (Signed)
[facility] DEPT Provider Note   CSN: [id number] Arrival date & time: 05/26/22  0206     History  Chief Complaint  Patient presents with   Ankle Pain    Jillian Chandler is a 71 y.o. female.  The history is provided by the patient. A language interpreter was used.  Ankle Pain She tripped and fell at home injuring her right foot and ankle.  She went to sleep, but was awakened by pain.  She is unable to bear weight.  She denies other injury.  She specifically denies head, neck, back, chest, abdomen, other extremity injury.   Home Medications Prior to Admission medications   Medication Sig Start Date End Date Taking? Authorizing Provider  acetaminophen (TYLENOL) 500 MG tablet Take 1 tablet (500 mg total) by mouth every 6 (six) hours as needed. 09/15/15   Antonietta Breach, PA-C  amoxicillin-clavulanate (AUGMENTIN) 875-125 MG tablet Take 1 tablet by mouth every 12 (twelve) hours. 12/09/21   Curatolo, Adam, DO  atorvastatin (LIPITOR) 40 MG tablet TAKE 1 TABLET BY MOUTH ONCE DAILY 09/05/18   Saguier, Percell Miller, PA-C  b complex vitamins capsule Take 1 capsule by mouth daily.    [provider]  benzonatate (TESSALON) 100 MG capsule Take 1 capsule (100 mg total) by mouth 3 (three) times daily as needed for cough. 12/09/21   Curatolo, Adam, DO  Cholecalciferol (VITAMIN D3 PO) Take 3 tablets by mouth daily before breakfast.     [provider]  diazepam (VALIUM) 5 MG tablet 1 tab po q hs prn insomnia or anxiety 03/17/19   Saguier, Percell Miller, PA-C  diclofenac Sodium (VOLTAREN) 1 % GEL Apply 4 g topically 4 (four) times daily. To affected joint. 05/07/21   Rosemarie Ax, MD  fluticasone (FLONASE) 50 MCG/ACT nasal spray Place 1 spray into both nostrils daily. 07/30/18   Caccavale, Sophia, PA-C  lisinopril-hydrochlorothiazide (ZESTORETIC) 20-25 MG tablet Take 1 tablet by mouth once daily 06/22/19   Saguier, Percell Miller, PA-C  meloxicam [facility]) 15 MG tablet Take 1 tablet  by mouth once daily 03/21/19   Saguier, Percell Miller, PA-C  meloxicam (MOBIC) 15 MG tablet Take 1 tablet by mouth daily. 04/17/20   [provider]  metoprolol succinate (TOPROL-XL) 50 MG 24 hr tablet TAKE 1 TABLET BY MOUTH ONCE DAILY **TAKE  WITH  OR  IMMEDIATELY  FOLLOWING  A  MEAL 09/23/19   Saguier, Percell Miller, PA-C  olmesartan-hydrochlorothiazide (BENICAR HCT) 40-25 MG tablet Take 1 tablet by mouth daily. 04/17/20   [provider]  Omega-3 Fatty Acids (OMEGA 3 PO) Take 2 capsules by mouth daily.    [provider]  omeprazole (PRILOSEC) 20 MG capsule Take 1 capsule (20 mg total) by mouth daily. 05/21/22   Rancour, Annie Main, MD  phenol (CHLORASEPTIC) 1.4 % LIQD Use as directed 1 spray in the mouth or throat as needed for throat irritation / pain. 07/30/18   Caccavale, Sophia, PA-C  pyridOXINE (VITAMIN B-6) 100 MG tablet Take 100 mg by mouth daily before breakfast.    [provider]  QUEtiapine (SEROQUEL) 50 MG tablet Take 1 tablet (50 mg total) by mouth at bedtime. 04/05/19   Saguier, Percell Miller, PA-C  rosuvastatin (CRESTOR) 10 MG tablet Take by mouth. 04/17/20   [provider]  vitamin E 400 UNIT capsule Take 1,200 Units by mouth daily before breakfast.     [provider]      Allergies    Patient has no known allergies.    Review of  Systems   Review of Systems  All other systems reviewed and are negative.   Physical Exam Updated Vital Signs BP (!) 144/68 (BP Location: Left Arm)   Pulse 75   Temp (!) 97.5 F (36.4 C) (Oral)   Resp 15   Ht '5\' 7"'$  (1.702 m)   Wt 77.1 kg   SpO2 95%   BMI 26.63 kg/m  Physical Exam Vitals and nursing note reviewed.   71 year old female, resting comfortably and in no acute distress. Vital signs are significant for elevated blood pressure. Oxygen saturation is 95%, which is normal. Head is normocephalic and atraumatic. PERRLA, EOMI. Oropharynx is clear. Neck is nontender. Back is nontender. Lungs are clear  without rales, wheezes, or rhonchi. Chest is nontender. Heart has regular rate and rhythm without murmur. Abdomen is soft, flat, nontender. Extremities: No swelling or deformity is appreciated, but there is tenderness in the anterior and lateral aspect of the right ankle, dorsum and lateral aspect of the right midfoot.  There is no instability of the ankle mortise.  Distal pulses are strong and capillary refill is prompt. Skin is warm and dry without rash. Neurologic: Mental status is normal, cranial nerves are intact, moves all extremities equally.  ED Results / Procedures / Treatments    Radiology DG Ankle Complete Right  Result Date: 05/26/2022 CLINICAL DATA:  Fall EXAM: RIGHT ANKLE - COMPLETE 3+ VIEW; RIGHT FOOT COMPLETE - 3+ VIEW COMPARISON:  None Available. FINDINGS: There is no evidence of fracture, dislocation, or joint effusion. There is no evidence of arthropathy or other focal bone abnormality. Soft tissues are unremarkable. IMPRESSION: No acute abnormality of the right foot or ankle. Electronically Signed   By: Ulyses Jarred M.D.   On: 05/26/2022 02:53   DG Foot Complete Right  Result Date: 05/26/2022 CLINICAL DATA:  Fall EXAM: RIGHT ANKLE - COMPLETE 3+ VIEW; RIGHT FOOT COMPLETE - 3+ VIEW COMPARISON:  None Available. FINDINGS: There is no evidence of fracture, dislocation, or joint effusion. There is no evidence of arthropathy or other focal bone abnormality. Soft tissues are unremarkable. IMPRESSION: No acute abnormality of the right foot or ankle. Electronically Signed   By: Ulyses Jarred M.D.   On: 05/26/2022 02:53    Procedures Procedures    Medications Ordered in ED Medications  morphine (PF) 4 MG/ML injection 4 mg (has no administration in time range)    ED Course/ Medical Decision Making/ A&P                           Medical Decision Making Amount and/or Complexity of Data Reviewed Radiology: ordered.  Risk Prescription drug management.   Fall at home with  injury to right foot and ankle.  I have ordered x-rays.  Patient is requesting any analgesics be given as an injection.  I have ordered a dose of morphine.  X-rays show no evidence of fracture.  Have independently viewed the images, and agree with the radiologist's interpretation.  I have ordered an ankle splint orthotic to be applied.  Patient states that she has a walker at home which she is to use as needed.  I have recommended ice, elevation, over-the-counter NSAIDs and acetaminophen as needed for pain.  I have prescribed a small number of oxycodone tablets to take for severe pain.  Final Clinical Impression(s) / ED Diagnoses Final diagnoses:  Sprain of right ankle, unspecified ligament, initial encounter  Sprain of foot, right, initial encounter  Rx / DC Orders ED Discharge Orders          Ordered    oxyCODONE (ROXICODONE) 5 MG immediate release tablet  Every 4 hours PRN        05/26/22 3887              Delora Fuel, MD [birth year] [phone]

## 2022-05-26 NOTE — Discharge Instructions (Signed)
Aplique hielo durante treinta minutos seguidos, cuatro veces al [employer].  Mantenga el pie elevado tanto como sea posible.  Utilice su andador segn sea necesario.  [place] y/o acetaminofn segn sea necesario para Conservation officer, historic buildings. Si toma ambos medicamentos, obtendr un mejor alivio del dolor. Para el dolor intenso, puede tomar oxicodona.

## 2022-05-26 NOTE — ED Triage Notes (Signed)
Pt in with pain to R ankle after tripping and falling last evening around 5pm. +R pedal pulse, +swelling to lateral ankle noted

## 2022-05-26 NOTE — ED Notes (Signed)
Patient and husband verbalize understanding of discharge instructions via [place] interpreter. Opportunity for questioning and answers were provided. Armband removed by staff, pt discharged from ED. Wheeled out to lobby by husband

## 2022-07-22 ENCOUNTER — Emergency Department (HOSPITAL_BASED_OUTPATIENT_CLINIC_OR_DEPARTMENT_OTHER)
Admission: EM | Admit: 2022-07-22 | Discharge: 2022-07-22 | Disposition: A | Discharge status: Home / Self Care | Payer: Medicare Other | Attending: Emergency Medicine | Admitting: Emergency Medicine

## 2022-07-22 ENCOUNTER — Other Ambulatory Visit: Payer: Self-pay

## 2022-07-22 DIAGNOSIS — Z8616 Personal history of COVID-19: Secondary | ICD-10-CM | POA: Diagnosis not present

## 2022-07-22 DIAGNOSIS — Z853 Personal history of malignant neoplasm of breast: Secondary | ICD-10-CM | POA: Diagnosis not present

## 2022-07-22 DIAGNOSIS — Z87891 Personal history of nicotine dependence: Secondary | ICD-10-CM | POA: Diagnosis not present

## 2022-07-22 DIAGNOSIS — Z1152 Encounter for screening for COVID-19: Secondary | ICD-10-CM | POA: Diagnosis not present

## 2022-07-22 DIAGNOSIS — R058 Other specified cough: Secondary | ICD-10-CM | POA: Insufficient documentation

## 2022-07-22 DIAGNOSIS — I10 Essential (primary) hypertension: Secondary | ICD-10-CM | POA: Diagnosis not present

## 2022-07-22 DIAGNOSIS — J029 Acute pharyngitis, unspecified: Secondary | ICD-10-CM | POA: Insufficient documentation

## 2022-07-22 DIAGNOSIS — Z79899 Other long term (current) drug therapy: Secondary | ICD-10-CM | POA: Insufficient documentation

## 2022-07-22 DIAGNOSIS — E119 Type 2 diabetes mellitus without complications: Secondary | ICD-10-CM | POA: Insufficient documentation

## 2022-07-22 DIAGNOSIS — J028 Acute pharyngitis due to other specified organisms: Secondary | ICD-10-CM

## 2022-07-22 LAB — RESP PANEL BY RT-PCR (RSV, FLU A&B, COVID)  RVPGX2
Influenza A by PCR: NEGATIVE
Influenza B by PCR: NEGATIVE
Resp Syncytial Virus by PCR: NEGATIVE
SARS Coronavirus 2 by RT PCR: NEGATIVE

## 2022-07-22 LAB — GROUP A STREP BY PCR: Group A Strep by PCR: NOT DETECTED

## 2022-07-22 MED ORDER — IBUPROFEN 100 MG/5ML PO SUSP
600.0000 mg | Freq: Once | ORAL | Status: AC
  Administered 2022-07-22: 600 mg via ORAL
  Filled 2022-07-22: qty 30

## 2022-07-22 NOTE — ED Triage Notes (Signed)
Sore throat and coughing x 1 day. Denies fever.

## 2022-07-22 NOTE — ED Notes (Signed)
Written and verbal inst to pt  Verbalized an understanding  To home  

## 2022-07-22 NOTE — Discharge Instructions (Signed)
Home to rest.  Be sure to drink plenty of hydrating fluids.  Take Motrin and Tylenol as needed as directed for pain. Your test is negative for COVID, flu, RSV, strep throat.  I suspect your throat pain is from a virus.  Please recheck with your doctor if not improving in the next 2 days.  Hogar para descansar. Asegrese de beber muchos lquidos hidratantes. Tome Motrin y Tylenol segn sea necesario segn las indicaciones para Conservation officer, historic buildings. Su prueba es negativa para COVID, gripe, VRS, faringitis estreptoccica. Sospecho que tu dolor de garganta se debe a un virus. Vuelva a consultar con su mdico si no mejora en los prximos 2 das.

## 2022-07-22 NOTE — ED Provider Notes (Signed)
[facility] Provider Note   CSN: [id number] Arrival date & time: 07/22/22  1814     History  Chief Complaint  Patient presents with   Sore Throat   Cough    Jillian Chandler is a 71 y.o. female.  71 year old female presents with complaint of sore throat and cough x 1 day, no fever.  No known sick contacts.  Has not taken anything for home for her pain.  Past medical history includes hypertension, breast cancer postmastectomy, gastric ulcer, hyperlipidemia, arthritis.  Patient is a former smoker.       Home Medications Prior to Admission medications   Medication Sig Start Date End Date Taking? Authorizing Provider  acetaminophen (TYLENOL) 500 MG tablet Take 1 tablet (500 mg total) by mouth every 6 (six) hours as needed. 09/15/15   Antonietta Breach, PA-C  atorvastatin (LIPITOR) 40 MG tablet TAKE 1 TABLET BY MOUTH ONCE DAILY 09/05/18   Saguier, Percell Miller, PA-C  b complex vitamins capsule Take 1 capsule by mouth daily.    [provider]  Cholecalciferol (VITAMIN D3 PO) Take 3 tablets by mouth daily before breakfast.     [provider]  diazepam (VALIUM) 5 MG tablet 1 tab po q hs prn insomnia or anxiety 03/17/19   Saguier, Percell Miller, PA-C  diclofenac Sodium (VOLTAREN) 1 % GEL Apply 4 g topically 4 (four) times daily. To affected joint. 05/07/21   Rosemarie Ax, MD  fluticasone (FLONASE) 50 MCG/ACT nasal spray Place 1 spray into both nostrils daily. 07/30/18   Caccavale, Sophia, PA-C  lisinopril-hydrochlorothiazide (ZESTORETIC) 20-25 MG tablet Take 1 tablet by mouth once daily 06/22/19   Saguier, Percell Miller, PA-C  metoprolol succinate (TOPROL-XL) 50 MG 24 hr tablet TAKE 1 TABLET BY MOUTH ONCE DAILY **TAKE  WITH  OR  IMMEDIATELY  FOLLOWING  A  MEAL 09/23/19   Saguier, Percell Miller, PA-C  olmesartan-hydrochlorothiazide (BENICAR HCT) 40-25 MG tablet Take 1 tablet by mouth daily. 04/17/20   [provider]  Omega-3 Fatty Acids (OMEGA 3 PO) Take 2  capsules by mouth daily.    [provider]  omeprazole (PRILOSEC) 20 MG capsule Take 1 capsule (20 mg total) by mouth daily. 05/21/22   Rancour, Annie Main, MD  oxyCODONE (ROXICODONE) 5 MG immediate release tablet Take 1 tablet (5 mg total) by mouth every 4 (four) hours as needed for severe pain. 29/93/71   Delora Fuel, MD  phenol (CHLORASEPTIC) 1.4 % LIQD Use as directed 1 spray in the mouth or throat as needed for throat irritation / pain. 07/30/18   Caccavale, Sophia, PA-C  pyridOXINE (VITAMIN B-6) 100 MG tablet Take 100 mg by mouth daily before breakfast.    [provider]  QUEtiapine (SEROQUEL) 50 MG tablet Take 1 tablet (50 mg total) by mouth at bedtime. 04/05/19   Saguier, Percell Miller, PA-C  rosuvastatin (CRESTOR) 10 MG tablet Take by mouth. 04/17/20   [provider]  vitamin E 400 UNIT capsule Take 1,200 Units by mouth daily before breakfast.     [provider]      Allergies    Patient has no known allergies.    Review of Systems   Review of Systems Negative except as per HPI Physical Exam Updated Vital Signs BP (!) 169/83 (BP Location: Left Arm)   Pulse 89   Temp 98.3 F (36.8 C)   Resp 20   Wt 78.9 kg   SpO2 97%   BMI 27.25 kg/m  Physical Exam Vitals and nursing note reviewed.  Constitutional:      General: She is not in acute distress.    Appearance: She is well-developed. She is not diaphoretic.  HENT:     Head: Normocephalic and atraumatic.     Right Ear: Tympanic membrane and ear canal normal.     Left Ear: Tympanic membrane and ear canal normal.     Nose: Congestion present. No rhinorrhea.     Mouth/Throat:     Mouth: Mucous membranes are moist. No oral lesions.     Pharynx: Uvula midline. Posterior oropharyngeal erythema present. No pharyngeal swelling, oropharyngeal exudate or uvula swelling.     Tonsils: No tonsillar exudate or tonsillar abscesses. 1+ on the right. 1+ on the left.  Eyes:     Conjunctiva/sclera: Conjunctivae  normal.  Cardiovascular:     Rate and Rhythm: Normal rate and regular rhythm.     Heart sounds: Normal heart sounds.  Pulmonary:     Effort: Pulmonary effort is normal.     Breath sounds: Normal breath sounds.  Musculoskeletal:     Cervical back: Neck supple.  Lymphadenopathy:     Cervical: No cervical adenopathy.  Skin:    General: Skin is warm and dry.     Findings: No erythema or rash.  Neurological:     Mental Status: She is alert and oriented to person, place, and time.  Psychiatric:        Behavior: Behavior normal.     ED Results / Procedures / Treatments   Labs (all labs ordered are listed, but only abnormal results are displayed) Labs Reviewed  GROUP A STREP BY PCR  RESP PANEL BY RT-PCR (RSV, FLU A&B, COVID)  [id number]    EKG None  Radiology No results found.  Procedures Procedures    Medications Ordered in ED Medications  ibuprofen (ADVIL) 100 MG/5ML suspension 600 mg (has no administration in time range)    ED Course/ Medical Decision Making/ A&P                           Medical Decision Making  71 year old female presents with complaint of sore throat with cough onset yesterday, progressively worsening. Pain is worse with going.  Denies fevers, no known sick contacts.  On exam, has oropharyngeal erythema without exudate or oral lesions.  Has mild nasal congestion.  Lungs are clear to auscultation.  Patient is provided with liquid Motrin while in the emergency room for her pain.  History of diabetes, will hold off on treating with Decadron at this point.  Recommend recheck with her PCP in 2 days if symptoms or not improving.  She is negative for COVID, flu, RSV, strep throat.        Final Clinical Impression(s) / ED Diagnoses Final diagnoses:  Acute pharyngitis due to other specified organisms    Rx / DC Orders ED Discharge Orders     None         Tacy Learn, PA-C 07/22/22 [place], [place], DO 07/22/22 2324

## 2022-09-30 NOTE — Telephone Encounter (Signed)
Opened in error

## 2022-11-19 ENCOUNTER — Encounter: Payer: Self-pay | Admitting: *Deleted

## 2023-02-04 ENCOUNTER — Other Ambulatory Visit: Payer: Self-pay

## 2023-02-04 ENCOUNTER — Ambulatory Visit (HOSPITAL_COMMUNITY)
Admission: EM | Admit: 2023-02-04 | Discharge: 2023-02-04 | Disposition: A | Discharge status: Home / Self Care | Payer: 59 | Attending: Emergency Medicine | Admitting: Emergency Medicine

## 2023-02-04 ENCOUNTER — Encounter (HOSPITAL_COMMUNITY): Payer: Self-pay | Admitting: Emergency Medicine

## 2023-02-04 DIAGNOSIS — H60501 Unspecified acute noninfective otitis externa, right ear: Secondary | ICD-10-CM

## 2023-02-04 DIAGNOSIS — M542 Cervicalgia: Secondary | ICD-10-CM

## 2023-02-04 LAB — POCT RAPID STREP A (OFFICE): Rapid Strep A Screen: NEGATIVE

## 2023-02-04 MED ORDER — IBUPROFEN 400 MG PO TABS: 400.0000 mg | ORAL_TABLET | Freq: Four times a day (QID) | ORAL | 0 refills | Status: AC | PRN

## 2023-02-04 MED ORDER — ACETAMINOPHEN 500 MG PO TABS: 500.0000 mg | ORAL_TABLET | Freq: Four times a day (QID) | ORAL | 0 refills | Status: DC | PRN

## 2023-02-04 MED ORDER — ACETAMINOPHEN 500 MG PO TABS: 500.0000 mg | ORAL_TABLET | Freq: Four times a day (QID) | ORAL | 0 refills | Status: AC | PRN

## 2023-02-04 MED ORDER — NEOMYCIN-POLYMYXIN-HC 3.5-10000-1 OT SUSP: 4.0000 [drp] | Freq: Three times a day (TID) | OTIC | 0 refills | Status: AC

## 2023-02-04 NOTE — ED Triage Notes (Signed)
Right ear pain and right side of neck/throat pain started today.    Patient has not had any tylenol or ibuprofen

## 2023-02-04 NOTE — ED Provider Notes (Signed)
[facility]    CSN: [id number] Arrival date & time: 02/04/23  1940      History   Chief Complaint Chief Complaint  Patient presents with   Otalgia    HPI Jillian Chandler is a 72 y.o. female.  Right ear pain started today. Rates pain 9/10 The right side of her neck is tender as well. She feels pain with swallowing. Reports history of this 2 years ago and they told her it was a virus.  No fever or chills Eating and drinking normally.  No known sick contacts. No recent travel Denies recent swimming/flying or any water in the ears Has not taken any medications yet  Past Medical History:  Diagnosis Date   Arthritis    Breast cancer (HCC) 01/2009   left brest- status post mastectomy, intolerant to Arimidex, declined tamoxfien   Gastric ulcer    CLO (-) per EGD ~ 11-2011   Hyperlipidemia    Hypertension 2006    Patient Active Problem List   Diagnosis Date Noted   Greater trochanteric pain syndrome of right lower extremity 05/07/2021   Left lateral epicondylitis 02/28/2017   Left shoulder pain 02/28/2017   Bilateral knee pain 02/25/2017   Breast cancer, left breast (HCC) 10/02/2013   Hyperlipidemia 07/26/2013   Chest pain with moderate risk for cardiac etiology 07/06/2013   Headache(784.0) 10/03/2012   Menopause 02/17/2012   Diabetes mellitus (HCC) 01/25/2012   PUD (peptic ulcer disease) 01/25/2012   Diverticulitis 07/20/2011   General medical examination 06/18/2011   Back pain 11/25/2010   DEGENERATIVE JOINT DISEASE 01/21/2010   Essential hypertension 08/12/2009   BREAST CANCER, HX OF 08/12/2009    Past Surgical History:  Procedure Laterality Date   BREAST SURGERY     COLONOSCOPY W/ POLYPECTOMY  11/2011   MASTECTOMY Left 04/2009   MASTECTOMY Left    ROBOTIC ASSISTED BILATERAL SALPINGO OOPHERECTOMY Bilateral 12/09/2017   Procedure: XI ROBOTIC ASSISTED BILATERAL SALPINGO OOPHORECTOMY;  Surgeon: Shonna Chock, MD;  Location: [facility];  Service: Gynecology;   Laterality: Bilateral;    OB History     Gravida  4   Para  3   Term  3   Preterm      AB  1   Living  3      SAB  1   IAB      Ectopic      Multiple      Live Births  3            Home Medications    Prior to Admission medications   Medication Sig Start Date End Date Taking? Authorizing Provider  ibuprofen (ADVIL) 400 MG tablet Take 1 tablet (400 mg total) by mouth every 6 (six) hours as needed. 02/04/23  Yes Poppi Scantling, Lurena Joiner, PA-C  neomycin-polymyxin-hydrocortisone (CORTISPORIN) 3.5-10000-1 OTIC suspension Place 4 drops into the right ear 3 (three) times daily for 5 days. 02/04/23 02/09/23 Yes Toran Murch, Lurena Joiner, PA-C  acetaminophen (TYLENOL) 500 MG tablet Take 1 tablet (500 mg total) by mouth every 6 (six) hours as needed. 02/04/23   Ildefonso Keaney, Lurena Joiner, PA-C  atorvastatin (LIPITOR) 40 MG tablet TAKE 1 TABLET BY MOUTH ONCE DAILY 09/05/18   Saguier, Ramon Dredge, PA-C  b complex vitamins capsule Take 1 capsule by mouth daily.    [provider]  Cholecalciferol (VITAMIN D3 PO) Take 3 tablets by mouth daily before breakfast.     [provider]  diazepam (VALIUM) 5 MG tablet 1 tab po q hs prn insomnia  or anxiety 03/17/19   Saguier, Ramon Dredge, PA-C  lisinopril-hydrochlorothiazide (ZESTORETIC) 20-25 MG tablet Take 1 tablet by mouth once daily 06/22/19   Saguier, Ramon Dredge, PA-C  metoprolol succinate (TOPROL-XL) 50 MG 24 hr tablet TAKE 1 TABLET BY MOUTH ONCE DAILY **TAKE  WITH  OR  IMMEDIATELY  FOLLOWING  A  MEAL 09/23/19   Saguier, Ramon Dredge, PA-C  olmesartan-hydrochlorothiazide (BENICAR HCT) 40-25 MG tablet Take 1 tablet by mouth daily. 04/17/20   [provider]  Omega-3 Fatty Acids (OMEGA 3 PO) Take 2 capsules by mouth daily.    [provider]  phenol (CHLORASEPTIC) 1.4 % LIQD Use as directed 1 spray in the mouth or throat as needed for throat irritation / pain. 07/30/18   Caccavale, Sophia, PA-C  pyridOXINE (VITAMIN B-6) 100 MG tablet Take 100 mg by mouth daily  before breakfast.    [provider]  QUEtiapine (SEROQUEL) 50 MG tablet Take 1 tablet (50 mg total) by mouth at bedtime. 04/05/19   Saguier, Ramon Dredge, PA-C  vitamin E 400 UNIT capsule Take 1,200 Units by mouth daily before breakfast.     [provider]    Family History Family History  Problem Relation Age of Onset   Heart attack Father 49       F age 66, M age 52s   Heart disease Father    Diabetes Sister    Diabetes Brother    Diabetes Sister    Heart disease Mother    Diabetes Maternal Aunt    Diabetes Maternal Uncle    Diabetes Paternal Aunt    Diabetes Paternal Uncle    Diabetes Maternal Grandmother    Diabetes Maternal Grandfather    Diabetes Paternal Grandmother    Diabetes Paternal Grandfather    Stroke Neg Hx    Colon cancer Neg Hx    Breast cancer Neg Hx     Social History Social History   Tobacco Use   Smoking status: Former    Types: Cigarettes    Quit date: 02/17/2007    Years since quitting: 15.9   Smokeless tobacco: Never  Vaping Use   Vaping Use: Never used  Substance Use Topics   Alcohol use: No   Drug use: No     Allergies   Patient has no known allergies.   Review of Systems Review of Systems As per HPI  Physical Exam Triage Vital Signs ED Triage Vitals  Enc Vitals Group     BP      Pulse      Resp      Temp      Temp src      SpO2      Weight      Height      Head Circumference      Peak Flow      Pain Score      Pain Loc      Pain Edu?      Excl. in GC?    No data found.  Updated Vital Signs BP (!) 153/81 (BP Location: Right Arm)   Pulse 78   Temp 98.3 F (36.8 C) (Oral)   Resp 18   SpO2 94%    Physical Exam Vitals and nursing note reviewed.  Constitutional:      General: She is not in acute distress.    Appearance: She is not ill-appearing.  HENT:     Right Ear: Tympanic membrane normal. Tenderness present.     Left Ear: Tympanic membrane and  ear canal normal.     Ears:     Comments: Right  ear tenderness with otoscope exam. Minimally erythematous R canal. TMs are normal bilat    Nose: No congestion or rhinorrhea.     Mouth/Throat:     Mouth: Mucous membranes are moist. No oral lesions.     Pharynx: Oropharynx is clear. Uvula midline. No pharyngeal swelling, oropharyngeal exudate or posterior oropharyngeal erythema.     Comments: No erythema, exudate, swelling, lesions  Eyes:     Conjunctiva/sclera: Conjunctivae normal.  Neck:      Comments: Tender right neck.No LAD or obvious swelling. No movement pain and full ROM. Normal phonation, tolerating secretions  Cardiovascular:     Rate and Rhythm: Normal rate and regular rhythm.     Pulses: Normal pulses.     Heart sounds: Normal heart sounds.  Pulmonary:     Effort: Pulmonary effort is normal.     Breath sounds: Normal breath sounds.  Musculoskeletal:     Cervical back: Normal range of motion. Tenderness present. No rigidity.  Lymphadenopathy:     Cervical: No cervical adenopathy.  Skin:    General: Skin is warm and dry.  Neurological:     Mental Status: She is alert and oriented to person, place, and time.     UC Treatments / Results  Labs (all labs ordered are listed, but only abnormal results are displayed) Labs Reviewed  CULTURE, GROUP A STREP [facility])  POCT RAPID STREP A (OFFICE)    EKG  Radiology No results found.  Procedures Procedures (including critical care time)  Medications Ordered in UC Medications - No data to display  Initial Impression / Assessment and Plan / UC Course  I have reviewed the triage vital signs and the nursing notes.  Pertinent labs & imaging results that were available during my care of the patient were reviewed by me and considered in my medical decision making (see chart for details).  Afebrile and well appearing Throat exam unremarkable but strep swab obtained, negative. Culture pending. Right ear mildly erythematous canal and tender with exam, will treat as otitis  externa with cortisporin drops TID x 5 days.  Good kidney function, reviewed labs from 5 months ago. Recommend to alternate 400 mg ibuprofen and 500 mg tylenol every 4-6 hours and monitoring for improvement. Discussed if symptoms worsen she needs to be evaluated in the ED. Patient agreeable to plan, no questions at this time  Patient had mobic refilled yesterday but has not started yet. Understands not to use mobic while taking the ibuprofen   Final Clinical Impressions(s) / UC Diagnoses   Final diagnoses:  Acute otitis externa of right ear, unspecified type  Neck pain on right side     Discharge Instructions      the strep test is negative. for your neck pain i recommend to try ibuprofen and tylenol alternated every 4-6 hours. this should help discomfort. if your symptoms worsen you need to go directly to the emergency department. for your right ear pain you can try the ear drops. use three times daily for the next 5 days  la prueba de estreptococo es negativa. Para tu dolor de cuello te recomiendo probar ibuprofeno y tylenol alternados cada 4 o 6 horas. esto debera ayudar a [employer]. Si sus sntomas empeoran debe acudir directamente al servicio de urgencias.  Para el dolor de odo derecho, puedes probar las gotas para los odos. utilizar tres veces al [employer] prximos 5  das     ED Prescriptions     Medication Sig Dispense Auth. Provider   neomycin-polymyxin-hydrocortisone (CORTISPORIN) 3.5-10000-1 OTIC suspension Place 4 drops into the right ear 3 (three) times daily for 5 days. 10 mL December Hedtke, PA-C         ibuprofen (ADVIL) 400 MG tablet Take 1 tablet (400 mg total) by mouth every 6 (six) hours as needed. 30 tablet [place] Noori, PA-C   acetaminophen (TYLENOL) 500 MG tablet Take 1 tablet (500 mg total) by mouth every 6 (six) hours as needed. 30 tablet Kayela Humphres, Lurena Joiner, PA-C      PDMP not reviewed this encounter.   Kanyon Seibold, Ray Church 02/04/23 2018

## 2023-02-04 NOTE — Discharge Instructions (Addendum)
the strep test is negative. for your neck pain i recommend to try ibuprofen and tylenol alternated every 4-6 hours. this should help discomfort. if your symptoms worsen you need to go directly to the emergency department. for your right ear pain you can try the ear drops. use three times daily for the next 5 days  la prueba de estreptococo es negativa. Para tu dolor de cuello te recomiendo probar ibuprofeno y tylenol alternados cada 4 o 6 horas. esto debera ayudar a [employer]. Si sus sntomas empeoran debe acudir directamente al servicio de urgencias.  Para el dolor de odo derecho, puedes probar las gotas para los odos. utilizar tres veces al [employer] prximos 5 [place]

## 2023-02-07 LAB — CULTURE, GROUP A STREP (THRC)

## 2023-06-27 ENCOUNTER — Ambulatory Visit (INDEPENDENT_AMBULATORY_CARE_PROVIDER_SITE_OTHER): Payer: 59

## 2023-06-27 ENCOUNTER — Other Ambulatory Visit: Payer: Self-pay

## 2023-06-27 ENCOUNTER — Ambulatory Visit (HOSPITAL_COMMUNITY)
Admission: EM | Admit: 2023-06-27 | Discharge: 2023-06-27 | Disposition: A | Discharge status: Home / Self Care | Payer: 59 | Attending: Family Medicine | Admitting: Family Medicine

## 2023-06-27 ENCOUNTER — Encounter (HOSPITAL_COMMUNITY): Payer: Self-pay | Admitting: *Deleted

## 2023-06-27 DIAGNOSIS — R5383 Other fatigue: Secondary | ICD-10-CM | POA: Diagnosis not present

## 2023-06-27 DIAGNOSIS — L309 Dermatitis, unspecified: Secondary | ICD-10-CM | POA: Diagnosis present

## 2023-06-27 DIAGNOSIS — G8929 Other chronic pain: Secondary | ICD-10-CM | POA: Diagnosis present

## 2023-06-27 DIAGNOSIS — M898X1 Other specified disorders of bone, shoulder: Secondary | ICD-10-CM

## 2023-06-27 LAB — BASIC METABOLIC PANEL
Anion gap: 8 (ref 5–15)
BUN: 14 mg/dL (ref 8–23)
CO2: 29 mmol/L (ref 22–32)
Calcium: 10.1 mg/dL (ref 8.9–10.3)
Chloride: 101 mmol/L (ref 98–111)
Creatinine, Ser: 0.58 mg/dL (ref 0.44–1.00)
GFR, Estimated: >60 mL/min (ref >60)
Glucose, Bld: 122 mg/dL — ABNORMAL HIGH (ref 70–99)
Potassium: 3.9 mmol/L (ref 3.5–5.1)
Sodium: 138 mmol/L (ref 135–145)

## 2023-06-27 LAB — CBC
HCT: 48.3 % — ABNORMAL HIGH (ref 36.0–46.0)
Hemoglobin: 15.8 g/dL — ABNORMAL HIGH (ref 12.0–15.0)
MCH: 28.4 pg (ref 26.0–34.0)
MCHC: 32.7 g/dL (ref 30.0–36.0)
MCV: 86.7 fL (ref 80.0–100.0)
Platelets: 342 10*3/uL (ref 150–400)
RBC: 5.57 MIL/uL — ABNORMAL HIGH (ref 3.87–5.11)
RDW: 13.3 % (ref 11.5–15.5)
WBC: 8.8 10*3/uL (ref 4.0–10.5)
nRBC: 0 % (ref 0.0–0.2)

## 2023-06-27 NOTE — ED Triage Notes (Signed)
Pt reports for several months she has had back pain and has felt weak since this morning. Pt also reports a rash on palm of LT hand.

## 2023-06-27 NOTE — Discharge Instructions (Addendum)
Nice meeting you today. I sent in steroid cream for your palmar rash. Your scapular x-ray Shows that you have mild arthritis. Please follow up with your PCP for reassessment. In the meantime, continue using meloxicam and Tylenol as needed. We will get some labs today to assess your fatigue. If this worsens, please go to the ED. Otherwise, we will call you with your test result soon.   Un placer conocerte hoy. Te envi crema con esteroides para tu sarpullido palmar. Su radiografa escapular muestra que tiene artritis leve. Haga un seguimiento con su PCP para una nueva evaluacin. Mientras tanto, contine usando meloxicam y Tylenol segn sea necesario. Haremos algunos anlisis de laboratorio hoy para evaluar su fatiga. Si esto empeora, vaya al servicio de [place]. De lo contrario, lo llamaremos pronto con el resultado de su prueba.

## 2023-06-27 NOTE — ED Notes (Signed)
Tele used

## 2023-06-27 NOTE — ED Provider Notes (Signed)
[facility]    CSN: [id number] Arrival date & time: 06/27/23  1418      History   Chief Complaint Chief Complaint  Patient presents with   Weakness   Back Pain    HPI Jillian Chandler is a 72 y.o. female.   The history is provided by the patient. The history is limited by a language barrier. A language interpreter was used ([id number] - Interpreter ID Number).  Back Pain Location:  Thoracic spine (Mid upper back x 4-5 months) Quality:  Aching Radiates to: Pain sometimes radiates to her right flank. Pain severity:  Moderate Onset quality:  Gradual Duration:  5 months Timing:  Intermittent Context: not recent injury   Context comment:  Her back pain started after she had mammogram because the radiology tech was pulling on her breast. She attributed her back pain to this Relieved by:  NSAIDs (She has not taken any medications today. However, she sometimes takes Tylenol or Meloxicam as needed for pain which helps.) Worsened by:  Nothing Associated symptoms: weakness   Associated symptoms: no abdominal pain, no dysuria and no fever   Weakness Severity:  Moderate Onset quality:  Gradual Duration: Woke up tired this morning. Progression:  Unchanged Chronicity:  New Context: not decreased sleep, not recent infection and not urinary tract infection   Context comment:  She had dinner at 6 pm on Sat and woke up this morning very weak. She has no appetite today Relieved by:  Nothing Worsened by:  Nothing Associated symptoms: anorexia   Associated symptoms: no abdominal pain, no diarrhea, no dizziness, no dysuria, no fever, no nausea and no vomiting   Rash Location: Rash on her palm B/L which started 2 yrs ago. it gets itchy and red. Used a cream in the past which helped a little. Associated symptoms: no abdominal pain, no diarrhea, no fever, no nausea and not vomiting    Woke up weak with loss of energy since this morning.   Past Medical History:  Diagnosis Date    Arthritis    Breast cancer (HCC) 01/2009   left brest- status post mastectomy, intolerant to Arimidex, declined tamoxfien   Gastric ulcer    CLO (-) per EGD ~ 11-2011   Hyperlipidemia    Hypertension 2006    Patient Active Problem List   Diagnosis Date Noted   Greater trochanteric pain syndrome of right lower extremity 05/07/2021   Left lateral epicondylitis 02/28/2017   Left shoulder pain 02/28/2017   Bilateral knee pain 02/25/2017   Breast cancer, left breast (HCC) 10/02/2013   Hyperlipidemia 07/26/2013   Chest pain with moderate risk for cardiac etiology 07/06/2013   Headache 10/03/2012   Menopause 02/17/2012   Diabetes mellitus (HCC) 01/25/2012   PUD (peptic ulcer disease) 01/25/2012   Diverticulitis 07/20/2011   General medical examination 06/18/2011   Back pain 11/25/2010   Osteoarthritis 01/21/2010   Essential hypertension 08/12/2009   BREAST CANCER, HX OF 08/12/2009    Past Surgical History:  Procedure Laterality Date   BREAST SURGERY     COLONOSCOPY W/ POLYPECTOMY  11/2011   MASTECTOMY Left 04/2009   MASTECTOMY Left    ROBOTIC ASSISTED BILATERAL SALPINGO OOPHERECTOMY Bilateral 12/09/2017   Procedure: XI ROBOTIC ASSISTED BILATERAL SALPINGO OOPHORECTOMY;  Surgeon: Shonna Chock, MD;  Location: [facility];  Service: Gynecology;  Laterality: Bilateral;    OB History     Gravida  4   Para  3   Term  3   Preterm  AB  1   Living  3      SAB  1   IAB      Ectopic      Multiple      Live Births  3            Home Medications    Prior to Admission medications   Medication Sig Start Date End Date Taking? Authorizing Provider  acetaminophen (TYLENOL) 500 MG tablet Take 1 tablet (500 mg total) by mouth every 6 (six) hours as needed. 02/04/23  Yes Rising, Lurena Joiner, PA-C  atorvastatin (LIPITOR) 40 MG tablet TAKE 1 TABLET BY MOUTH ONCE DAILY 09/05/18  Yes Saguier, Ramon Dredge, PA-C  b complex vitamins capsule Take 1 capsule by mouth daily.   Yes [provider]  Cholecalciferol (VITAMIN D3 PO) Take 3 tablets by mouth daily before breakfast.    Yes [provider]  diazepam (VALIUM) 5 MG tablet 1 tab po q hs prn insomnia or anxiety 03/17/19  Yes Saguier, Ramon Dredge, PA-C  ibuprofen (ADVIL) 400 MG tablet Take 1 tablet (400 mg total) by mouth every 6 (six) hours as needed. 02/04/23  Yes Rising, Lurena Joiner, PA-C  lisinopril-hydrochlorothiazide (ZESTORETIC) 20-25 MG tablet Take 1 tablet by mouth once daily 06/22/19  Yes Saguier, Ramon Dredge, PA-C  metoprolol succinate (TOPROL-XL) 50 MG 24 hr tablet TAKE 1 TABLET BY MOUTH ONCE DAILY **TAKE  WITH  OR  IMMEDIATELY  FOLLOWING  A  MEAL 09/23/19  Yes Saguier, Ramon Dredge, PA-C  olmesartan-hydrochlorothiazide (BENICAR HCT) 40-25 MG tablet Take 1 tablet by mouth daily. 04/17/20  Yes [provider]  Omega-3 Fatty Acids (OMEGA 3 PO) Take 2 capsules by mouth daily.   Yes [provider]  phenol (CHLORASEPTIC) 1.4 % LIQD Use as directed 1 spray in the mouth or throat as needed for throat irritation / pain. 07/30/18  Yes Caccavale, Sophia, PA-C  pyridOXINE (VITAMIN B-6) 100 MG tablet Take 100 mg by mouth daily before breakfast.   Yes [provider]  QUEtiapine (SEROQUEL) 50 MG tablet Take 1 tablet (50 mg total) by mouth at bedtime. 04/05/19  Yes Saguier, Ramon Dredge, PA-C  vitamin E 400 UNIT capsule Take 1,200 Units by mouth daily before breakfast.    Yes [provider]    Family History Family History  Problem Relation Age of Onset   Heart attack Father 52       F age 37, M age 85s   Heart disease Father    Diabetes Sister    Diabetes Brother    Diabetes Sister    Heart disease Mother    Diabetes Maternal Aunt    Diabetes Maternal Uncle    Diabetes Paternal Aunt    Diabetes Paternal Uncle    Diabetes Maternal Grandmother    Diabetes Maternal Grandfather    Diabetes Paternal Grandmother    Diabetes Paternal Grandfather    Stroke Neg Hx    Colon cancer Neg Hx    Breast  cancer Neg Hx     Social History Social History   Tobacco Use   Smoking status: Former    Current packs/day: 0.00    Types: Cigarettes    Quit date: 02/17/2007    Years since quitting: 16.3   Smokeless tobacco: Never  Vaping Use   Vaping status: Never Used  Substance Use Topics   Alcohol use: No   Drug use: No     Allergies   Patient has no known allergies.   Review of Systems Review of  Systems  Constitutional:  Negative for fever.  Gastrointestinal:  Positive for anorexia. Negative for abdominal pain, diarrhea, nausea and vomiting.  Genitourinary:  Negative for dysuria.  Musculoskeletal:  Positive for back pain.  Skin:  Positive for rash.  Neurological:  Positive for weakness. Negative for dizziness.  All other systems reviewed and are negative.    Physical Exam Triage Vital Signs ED Triage Vitals  Encounter Vitals Group     BP      Systolic BP Percentile      Diastolic BP Percentile      Pulse      Resp      Temp      Temp src      SpO2      Weight      Height      Head Circumference      Peak Flow      Pain Score      Pain Loc      Pain Education      Exclude from Growth Chart    No data found.  Updated Vital Signs BP 125/77   Pulse 67   Temp 97.7 F (36.5 C)   Resp 18   SpO2 94%   Visual Acuity Right Eye Distance:   Left Eye Distance:   Bilateral Distance:    Right Eye Near:   Left Eye Near:    Bilateral Near:     Physical Exam Vitals and nursing note reviewed.  Constitutional:      General: She is not in acute distress.    Appearance: Normal appearance. She is not ill-appearing.  Eyes:     Extraocular Movements: Extraocular movements intact.     Pupils: Pupils are equal, round, and reactive to light.  Cardiovascular:     Rate and Rhythm: Normal rate and regular rhythm.     Heart sounds: Normal heart sounds. No murmur heard. Pulmonary:     Effort: Pulmonary effort is normal. No respiratory distress.     Breath sounds: Normal  breath sounds. No wheezing.  Musculoskeletal:     Cervical back: Normal.     Thoracic back: Normal.     Lumbar back: Normal.     Comments: Tenderness over her right scapular. No swelling.  Dry scaly, hyperkeratotic lesion of her left palm  Neurological:     Mental Status: She is alert.      UC Treatments / Results  Labs (all labs ordered are listed, but only abnormal results are displayed) Labs Reviewed  CBC  BASIC METABOLIC PANEL    EKG   Radiology DG Scapula Right  Result Date: 06/27/2023 CLINICAL DATA:  Right scapular pain. EXAM: RIGHT SCAPULA - 2+ VIEWS COMPARISON:  None Available. FINDINGS: There is no evidence of fracture. Mild degenerative changes are seen at the acromioclavicular joint. Soft tissues are unremarkable. IMPRESSION: Mild degenerative changes at the acromioclavicular joint. Otherwise, no findings to explain the patient's symptoms. Electronically Signed   By: Romona Curls M.D.   On: 06/27/2023 17:58    Procedures Procedures (including critical care time)  Medications Ordered in UC Medications - No data to display  Initial Impression / Assessment and Plan / UC Course  I have reviewed the triage vital signs and the nursing notes.  Pertinent labs & imaging results that were available during my care of the patient were reviewed by me and considered in my medical decision making (see chart for details).  Clinical Course as of 06/27/23 1803  Wynelle Link Jun 27, 2023  1800 Scapular pain: Chronic Xray: Mild degenerative changes at the acromioclavicular joint. Continue Tylenol or Meloxicam as needed for pain F/U with PCP for reassessment and further evaluation as needed [KE]  1800 Fatigue: Acute started today Normal physical exam Likely viral syndrome However, we will check lab work to r/o anemia or electrolyte disorder I will call her with her results Keep well hydrated and rest at home F/U soon if there is no improvement [KE]  1801 Hyperkeratotic palmar  dermatitis ?? related to chronic irritation vs contact dermatitis Will treat with steroids - topical Triamcinolone escribed [KE]    Clinical Course User Index [KE] Doreene Eland, MD     Final Clinical Impressions(s) / UC Diagnoses   Final diagnoses:  Chronic scapular pain  Other fatigue  Dermatitis     Discharge Instructions      Nice meeting you today. I sent in steroid cream for your palmar rash. Your scapular x-ray is negative. Please follow up with your PCP for reassessment. In the meantime, continue using meloxicam and Tylenol as needed. We will get some labs today to assess your fatigue. If this worsens, please go to the ED. Otherwise, we will call you with your test result soon.      ED Prescriptions   None    PDMP not reviewed this encounter.   Doreene Eland, MD 06/27/23 [phone]

## 2023-08-02 ENCOUNTER — Encounter (HOSPITAL_COMMUNITY): Payer: Self-pay | Admitting: Emergency Medicine

## 2023-08-02 ENCOUNTER — Ambulatory Visit (HOSPITAL_COMMUNITY)
Admission: EM | Admit: 2023-08-02 | Discharge: 2023-08-02 | Disposition: A | Discharge status: Home / Self Care | Payer: 59

## 2023-08-02 DIAGNOSIS — J22 Unspecified acute lower respiratory infection: Secondary | ICD-10-CM

## 2023-08-02 MED ORDER — AMOXICILLIN 875 MG PO TABS: 875.0000 mg | ORAL_TABLET | Freq: Two times a day (BID) | ORAL | 0 refills | Status: AC

## 2023-08-02 MED ORDER — ALBUTEROL SULFATE HFA 108 (90 BASE) MCG/ACT IN AERS: 2.0000 | INHALATION_SPRAY | Freq: Four times a day (QID) | RESPIRATORY_TRACT | 0 refills | Status: DC | PRN

## 2023-08-02 NOTE — ED Triage Notes (Signed)
Used spanish interpretor  Pt c/o soreness and inflammation in throat, neck, back pain, and chest due to coughing for 5 days. Pt reports cough is non-productive. Pt has hoarse voice as well. Pt taking allergy medications and something else for her symptoms.

## 2023-08-02 NOTE — ED Provider Notes (Signed)
[facility]    CSN: [id number] Arrival date & time: 08/02/23  1634      History   Chief Complaint Chief Complaint  Patient presents with   Cough   Hoarse    HPI Jillian Chandler is a 72 y.o. female, with history significant for type 2 diabetes, hyperlipidemia, hypertension. Patient here today with a 5-day history of cough, generalized body aches x 5 days.  Cough is nonproductive persistent.  No measurable fever.  Has been taking over-the-counter allergy and cold medication for symptom management without significant improvement. Patient endorses some chest discomfort with coughing but denies any overt shortness of breath.  Patient has no history of asthma or COPD.  Patient is a non-smoker. Past Medical History:  Diagnosis Date   Arthritis    Breast cancer (HCC) 01/2009   left brest- status post mastectomy, intolerant to Arimidex, declined tamoxfien   Gastric ulcer    CLO (-) per EGD ~ 11-2011   Hyperlipidemia    Hypertension 2006    Patient Active Problem List   Diagnosis Date Noted   Greater trochanteric pain syndrome of right lower extremity 05/07/2021   Left lateral epicondylitis 02/28/2017   Left shoulder pain 02/28/2017   Bilateral knee pain 02/25/2017   Breast cancer, left breast (HCC) 10/02/2013   Hyperlipidemia 07/26/2013   Chest pain with moderate risk for cardiac etiology 07/06/2013   Headache 10/03/2012   Menopause 02/17/2012   Diabetes mellitus (HCC) 01/25/2012   PUD (peptic ulcer disease) 01/25/2012   Diverticulitis 07/20/2011   General medical examination 06/18/2011   Back pain 11/25/2010   Osteoarthritis 01/21/2010   Essential hypertension 08/12/2009   BREAST CANCER, HX OF 08/12/2009    Past Surgical History:  Procedure Laterality Date   BREAST SURGERY     COLONOSCOPY W/ POLYPECTOMY  11/2011   MASTECTOMY Left 04/2009   MASTECTOMY Left    ROBOTIC ASSISTED BILATERAL SALPINGO OOPHERECTOMY Bilateral 12/09/2017   Procedure: XI ROBOTIC ASSISTED  BILATERAL SALPINGO OOPHORECTOMY;  Surgeon: Shonna Chock, MD;  Location: [facility];  Service: Gynecology;  Laterality: Bilateral;    OB History     Gravida  4   Para  3   Term  3   Preterm      AB  1   Living  3      SAB  1   IAB      Ectopic      Multiple      Live Births  3            Home Medications    Prior to Admission medications   Medication Sig Start Date End Date Taking? Authorizing Provider  albuterol (VENTOLIN HFA) 108 (90 Base) MCG/ACT inhaler Inhale 2 puffs into the lungs every 6 (six) hours as needed for wheezing or shortness of breath. 08/02/23  Yes Bing Neighbors, NP  amoxicillin (AMOXIL) 875 MG tablet Take 1 tablet (875 mg total) by mouth 2 (two) times daily for 7 days. 08/02/23 08/09/23 Yes Bing Neighbors, NP  omeprazole (PRILOSEC) 20 MG capsule Take 1 capsule by mouth daily. 07/29/23  Yes [provider]  acetaminophen (TYLENOL) 500 MG tablet Take 1 tablet (500 mg total) by mouth every 6 (six) hours as needed. 02/04/23   Rising, Lurena Joiner, PA-C  atorvastatin (LIPITOR) 40 MG tablet TAKE 1 TABLET BY MOUTH ONCE DAILY 09/05/18   Saguier, Ramon Dredge, PA-C  b complex vitamins capsule Take 1 capsule by mouth daily.    [provider]  Cholecalciferol (VITAMIN D3 PO) Take 3 tablets by mouth daily before breakfast.     [provider]  diazepam (VALIUM) 5 MG tablet 1 tab po q hs prn insomnia or anxiety 03/17/19   Saguier, Ramon Dredge, PA-C  ibuprofen (ADVIL) 400 MG tablet Take 1 tablet (400 mg total) by mouth every 6 (six) hours as needed. 02/04/23   Rising, Lurena Joiner, PA-C  lisinopril-hydrochlorothiazide (ZESTORETIC) 20-25 MG tablet Take 1 tablet by mouth once daily 06/22/19   Saguier, Ramon Dredge, PA-C  metoprolol succinate (TOPROL-XL) 50 MG 24 hr tablet TAKE 1 TABLET BY MOUTH ONCE DAILY **TAKE  WITH  OR  IMMEDIATELY  FOLLOWING  A  MEAL 09/23/19   Saguier, Ramon Dredge, PA-C  olmesartan-hydrochlorothiazide (BENICAR HCT) 40-25 MG tablet Take 1 tablet by  mouth daily. 04/17/20   [provider]  Omega-3 Fatty Acids (OMEGA 3 PO) Take 2 capsules by mouth daily.    [provider]  phenol (CHLORASEPTIC) 1.4 % LIQD Use as directed 1 spray in the mouth or throat as needed for throat irritation / pain. 07/30/18   Caccavale, Sophia, PA-C  pyridOXINE (VITAMIN B-6) 100 MG tablet Take 100 mg by mouth daily before breakfast.    [provider]  QUEtiapine (SEROQUEL) 50 MG tablet Take 1 tablet (50 mg total) by mouth at bedtime. 04/05/19   Saguier, Ramon Dredge, PA-C  vitamin E 400 UNIT capsule Take 1,200 Units by mouth daily before breakfast.     [provider]    Family History Family History  Problem Relation Age of Onset   Heart attack Father 75       F age 29, M age 17s   Heart disease Father    Diabetes Sister    Diabetes Brother    Diabetes Sister    Heart disease Mother    Diabetes Maternal Aunt    Diabetes Maternal Uncle    Diabetes Paternal Aunt    Diabetes Paternal Uncle    Diabetes Maternal Grandmother    Diabetes Maternal Grandfather    Diabetes Paternal Grandmother    Diabetes Paternal Grandfather    Stroke Neg Hx    Colon cancer Neg Hx    Breast cancer Neg Hx     Social History Social History   Tobacco Use   Smoking status: Former    Current packs/day: 0.00    Types: Cigarettes    Quit date: 02/17/2007    Years since quitting: 16.4   Smokeless tobacco: Never  Vaping Use   Vaping status: Never Used  Substance Use Topics   Alcohol use: No   Drug use: No     Allergies   Prednisone   Review of Systems Review of Systems  Respiratory:  Positive for cough.    Physical Exam Triage Vital Signs ED Triage Vitals  Encounter Vitals Group     BP 08/02/23 1852 (!) 156/82     Systolic BP Percentile --      Diastolic BP Percentile --      Pulse Rate 08/02/23 1852 87     Resp 08/02/23 1852 17     Temp 08/02/23 1852 98.3 F (36.8 C)     Temp Source 08/02/23 1852 Oral     SpO2 08/02/23 1852  95 %     Weight --      Height --      Head Circumference --      Peak Flow --      Pain Score 08/02/23 1848 8  Pain Loc --      Pain Education --      Exclude from Growth Chart --    No data found.  Updated Vital Signs BP (!) 156/82 (BP Location: Right Arm)   Pulse 87   Temp 98.3 F (36.8 C) (Oral)   Resp 17   SpO2 95%   Visual Acuity Right Eye Distance:   Left Eye Distance:   Bilateral Distance:    Right Eye Near:   Left Eye Near:    Bilateral Near:     Physical Exam Vitals reviewed.  Constitutional:      Appearance: She is ill-appearing.  HENT:     Head: Normocephalic and atraumatic.     Right Ear: Tympanic membrane, ear canal and external ear normal.     Left Ear: Tympanic membrane, ear canal and external ear normal.     Nose: Rhinorrhea present. No congestion.     Mouth/Throat:     Pharynx: Postnasal drip present. No oropharyngeal exudate or posterior oropharyngeal erythema.  Eyes:     Extraocular Movements: Extraocular movements intact.     Conjunctiva/sclera: Conjunctivae normal.     Pupils: Pupils are equal, round, and reactive to light.  Cardiovascular:     Rate and Rhythm: Normal rate and regular rhythm.  Pulmonary:     Breath sounds: Rhonchi present.  Chest:     Chest wall: Tenderness present.  Musculoskeletal:     Cervical back: Normal range of motion and neck supple.  Lymphadenopathy:     Cervical: Cervical adenopathy present.  Skin:    General: Skin is warm and dry.  Neurological:     General: No focal deficit present.     Mental Status: She is alert.      UC Treatments / Results  Labs (all labs ordered are listed, but only abnormal results are displayed) Labs Reviewed - No data to display  EKG   Radiology No results found.  Procedures Procedures (including critical care time)  Medications Ordered in UC Medications - No data to display  Initial Impression / Assessment and Plan / UC Course  I have reviewed the triage  vital signs and the nursing notes.  Pertinent labs & imaging results that were available during my care of the patient were reviewed by me and considered in my medical decision making (see chart for details).    Acute lower respiratory illness, based on patient history and evaluation findings of concern for bacterial etiology therefore covering with amoxicillin 875 twice daily for 7 days.  Prescribed albuterol 2 puffs every 6 hours as needed for wheezing, shortness of breath or chest tightness.  Return precautions given if symptoms worsen or do not improve.  Patient verbalized understanding and agreement with plan. Final Clinical Impressions(s) / UC Diagnoses   Final diagnoses:  Acute lower respiratory infection   Discharge Instructions   None    ED Prescriptions     Medication Sig Dispense Auth. Provider   albuterol (VENTOLIN HFA) 108 (90 Base) MCG/ACT inhaler Inhale 2 puffs into the lungs every 6 (six) hours as needed for wheezing or shortness of breath. 8 g Bing Neighbors, NP   amoxicillin (AMOXIL) 875 MG tablet Take 1 tablet (875 mg total) by mouth 2 (two) times daily for 7 days. 14 tablet Bing Neighbors, NP      PDMP not reviewed this encounter.   Bing Neighbors, NP 08/02/23 2036

## 2023-10-29 ENCOUNTER — Emergency Department (HOSPITAL_BASED_OUTPATIENT_CLINIC_OR_DEPARTMENT_OTHER)
Admission: EM | Admit: 2023-10-29 | Discharge: 2023-10-30 | Disposition: A | Discharge status: Home / Self Care | Attending: Emergency Medicine | Admitting: Emergency Medicine

## 2023-10-29 ENCOUNTER — Other Ambulatory Visit: Payer: Self-pay

## 2023-10-29 ENCOUNTER — Encounter (HOSPITAL_BASED_OUTPATIENT_CLINIC_OR_DEPARTMENT_OTHER): Payer: Self-pay | Admitting: Emergency Medicine

## 2023-10-29 DIAGNOSIS — Z853 Personal history of malignant neoplasm of breast: Secondary | ICD-10-CM | POA: Insufficient documentation

## 2023-10-29 DIAGNOSIS — Z87891 Personal history of nicotine dependence: Secondary | ICD-10-CM | POA: Insufficient documentation

## 2023-10-29 DIAGNOSIS — D72829 Elevated white blood cell count, unspecified: Secondary | ICD-10-CM | POA: Insufficient documentation

## 2023-10-29 DIAGNOSIS — R112 Nausea with vomiting, unspecified: Secondary | ICD-10-CM | POA: Diagnosis not present

## 2023-10-29 DIAGNOSIS — R197 Diarrhea, unspecified: Secondary | ICD-10-CM | POA: Diagnosis not present

## 2023-10-29 DIAGNOSIS — R1013 Epigastric pain: Secondary | ICD-10-CM | POA: Insufficient documentation

## 2023-10-29 DIAGNOSIS — I10 Essential (primary) hypertension: Secondary | ICD-10-CM | POA: Insufficient documentation

## 2023-10-29 LAB — URINALYSIS, ROUTINE W REFLEX MICROSCOPIC
Bilirubin Urine: NEGATIVE
Glucose, UA: NEGATIVE mg/dL
Ketones, ur: NEGATIVE mg/dL
Leukocytes,Ua: NEGATIVE
Nitrite: NEGATIVE
Protein, ur: NEGATIVE mg/dL
Specific Gravity, Urine: 1.02 (ref 1.005–1.030)
pH: 6 (ref 5.0–8.0)

## 2023-10-29 LAB — LIPASE, BLOOD: Lipase: 19 U/L (ref 11–51)

## 2023-10-29 LAB — COMPREHENSIVE METABOLIC PANEL WITH GFR
ALT: 22 U/L (ref 0–44)
AST: 24 U/L (ref 15–41)
Albumin: 4.4 g/dL (ref 3.5–5.0)
Alkaline Phosphatase: 82 U/L (ref 38–126)
Anion gap: 8 (ref 5–15)
BUN: 16 mg/dL (ref 8–23)
CO2: 28 mmol/L (ref 22–32)
Calcium: 9.7 mg/dL (ref 8.9–10.3)
Chloride: 100 mmol/L (ref 98–111)
Creatinine, Ser: 0.61 mg/dL (ref 0.44–1.00)
GFR, Estimated: >60 mL/min (ref >60)
Glucose, Bld: 134 mg/dL — ABNORMAL HIGH (ref 70–99)
Potassium: 4.1 mmol/L (ref 3.5–5.1)
Sodium: 136 mmol/L (ref 135–145)
Total Bilirubin: 0.6 mg/dL (ref 0.0–1.2)
Total Protein: 7.4 g/dL (ref 6.5–8.1)

## 2023-10-29 LAB — CBC
HCT: 47.8 % — ABNORMAL HIGH (ref 36.0–46.0)
Hemoglobin: 15.8 g/dL — ABNORMAL HIGH (ref 12.0–15.0)
MCH: 28.6 pg (ref 26.0–34.0)
MCHC: 33.1 g/dL (ref 30.0–36.0)
MCV: 86.4 fL (ref 80.0–100.0)
Platelets: 293 10*3/uL (ref 150–400)
RBC: 5.53 MIL/uL — ABNORMAL HIGH (ref 3.87–5.11)
RDW: 13.7 % (ref 11.5–15.5)
WBC: 12.7 10*3/uL — ABNORMAL HIGH (ref 4.0–10.5)
nRBC: 0 % (ref 0.0–0.2)

## 2023-10-29 LAB — URINALYSIS, MICROSCOPIC (REFLEX)

## 2023-10-29 NOTE — ED Triage Notes (Addendum)
 Patient complaining of abdominal pain starting today. Reports 4 episodes of diarrhea and 1 episode of vomiting. Patient also reports body aches and headache. Patient states husband has been sick with similar symptoms.

## 2023-10-30 ENCOUNTER — Emergency Department (HOSPITAL_BASED_OUTPATIENT_CLINIC_OR_DEPARTMENT_OTHER)

## 2023-10-30 MED ORDER — PROCHLORPERAZINE EDISYLATE 10 MG/2ML IJ SOLN
10.0000 mg | Freq: Once | INTRAMUSCULAR | Status: AC
Start: 2023-10-30 — End: 2023-10-30
  Administered 2023-10-30: 10 mg via INTRAVENOUS
  Filled 2023-10-30: qty 2

## 2023-10-30 MED ORDER — IOHEXOL 300 MG/ML  SOLN
100.0000 mL | Freq: Once | INTRAMUSCULAR | Status: AC | PRN
  Administered 2023-10-30: 100 mL via INTRAVENOUS

## 2023-10-30 MED ORDER — ONDANSETRON 4 MG PO TBDP: 4.0000 mg | ORAL_TABLET | Freq: Three times a day (TID) | ORAL | 0 refills | Status: AC | PRN

## 2023-10-30 MED ORDER — DIPHENHYDRAMINE HCL 50 MG/ML IJ SOLN
25.0000 mg | Freq: Once | INTRAMUSCULAR | Status: AC
  Administered 2023-10-30: 25 mg via INTRAVENOUS
  Filled 2023-10-30: qty 1

## 2023-10-30 MED ORDER — FAMOTIDINE 20 MG PO TABS: 20.0000 mg | ORAL_TABLET | Freq: Two times a day (BID) | ORAL | 0 refills | Status: AC

## 2023-10-30 MED ORDER — FAMOTIDINE IN NACL 20-0.9 MG/50ML-% IV SOLN
20.0000 mg | Freq: Once | INTRAVENOUS | Status: AC
  Administered 2023-10-30: 20 mg via INTRAVENOUS
  Filled 2023-10-30: qty 50

## 2023-10-30 MED ORDER — LOPERAMIDE HCL 2 MG PO CAPS: 2.0000 mg | ORAL_CAPSULE | Freq: Four times a day (QID) | ORAL | 0 refills | Status: AC | PRN

## 2023-10-30 MED ORDER — SODIUM CHLORIDE 0.9 % IV BOLUS
1000.0000 mL | Freq: Once | INTRAVENOUS | Status: AC
Start: 2023-10-30 — End: 2023-10-30
  Administered 2023-10-30: 1000 mL via INTRAVENOUS

## 2023-10-30 MED ORDER — KETOROLAC TROMETHAMINE 15 MG/ML IJ SOLN
15.0000 mg | Freq: Once | INTRAMUSCULAR | Status: AC
  Administered 2023-10-30: 15 mg via INTRAVENOUS
  Filled 2023-10-30: qty 1

## 2023-10-30 NOTE — Discharge Instructions (Signed)
 You were evaluated in the Emergency Department and after careful evaluation, we did not find any emergent condition requiring admission or further testing in the hospital.  Your exam/testing today is overall reassuring.  Symptoms likely due to a viral gastroenteritis.  Use the Zofran as needed for nausea, use the Imodium as needed for diarrhea, use the Pepcid as needed for abdominal discomfort.  Plenty fluids and rest.  Please return to the Emergency Department if you experience any worsening of your condition.   Thank you for allowing [place] to be a part of your care.

## 2023-10-30 NOTE — ED Provider Notes (Signed)
 [facility] Emergency Department Provider Note MRN:  [id number]  Arrival date & time: 10/30/23     Chief Complaint   Abdominal Pain   History of Present Illness   Jillian Chandler is a 73 y.o. year-old female with a history of hypertension, hyperlipidemia presenting to the ED with chief complaint of abdominal pain.  Epigastric abdominal pain with nausea vomiting and diarrhea.  Husband has similar symptoms.  Denies fever.  Review of Systems  A thorough review of systems was obtained and all systems are negative except as noted in the HPI and PMH.   Patient's Health History    Past Medical History:  Diagnosis Date   Arthritis    Breast cancer (HCC) 01/2009   left brest- status post mastectomy, intolerant to Arimidex, declined tamoxfien   Gastric ulcer    CLO (-) per EGD ~ 11-2011   Hyperlipidemia    Hypertension 2006    Past Surgical History:  Procedure Laterality Date   BREAST SURGERY     COLONOSCOPY W/ POLYPECTOMY  11/2011   MASTECTOMY Left 04/2009   MASTECTOMY Left    ROBOTIC ASSISTED BILATERAL SALPINGO OOPHERECTOMY Bilateral 12/09/2017   Procedure: XI ROBOTIC ASSISTED BILATERAL SALPINGO OOPHORECTOMY;  Surgeon: Shonna Chock, MD;  Location: [facility];  Service: Gynecology;  Laterality: Bilateral;    Family History  Problem Relation Age of Onset   Heart attack Father 10       F age 28, M age 69s   Heart disease Father    Diabetes Sister    Diabetes Brother    Diabetes Sister    Heart disease Mother    Diabetes Maternal Aunt    Diabetes Maternal Uncle    Diabetes Paternal Aunt    Diabetes Paternal Uncle    Diabetes Maternal Grandmother    Diabetes Maternal Grandfather    Diabetes Paternal Grandmother    Diabetes Paternal Grandfather    Stroke Neg Hx    Colon cancer Neg Hx    Breast cancer Neg Hx     Social History   Socioeconomic History   Marital status: Married    Spouse name: Not on file   Number of children: 4   Years of  education: Not on file   Highest education level: Not on file  Occupational History   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  Tobacco Use   Smoking status: Former    Current packs/day: 0.00    Types: Cigarettes    Quit date: 02/17/2007    Years since quitting: 16.7   Smokeless tobacco: Never  Vaping Use   Vaping status: Never Used  Substance and Sexual Activity   Alcohol use: No   Drug use: No   Sexual activity: Not on file  Other Topics Concern   Not on file  Social History Narrative   Original from [place] ---   Diet: very  healthy   Exercise- not frequently but active at work               Social Drivers of Corporate investment banker Strain: Not on file  Food Insecurity: Not on file  Transportation Needs: Not on file  Physical Activity: Not on file  Stress: Not on file  Social Connections: Not on file  Intimate Partner Violence: Not on file     Physical Exam   Vitals:   10/30/23 0045 10/30/23 0145  BP: (!) 160/80 137/75  Pulse: (!) 102 99  Resp:  18  Temp:    SpO2: 94% 96%    CONSTITUTIONAL: Well-appearing, NAD NEURO/PSYCH:  Alert and oriented x 3, no focal deficits EYES:  eyes equal and reactive ENT/NECK:  no LAD, no JVD CARDIO: Regular rate, well-perfused, normal S1 and S2 PULM:  CTAB no wheezing or rhonchi GI/GU:  non-distended, moderate epigastric tenderness to palpation MSK/SPINE:  No gross deformities, no edema SKIN:  no rash, atraumatic   *Additional and/or pertinent findings included in MDM below  Diagnostic and Interventional Summary    EKG Interpretation Date/Time:    Ventricular Rate:    PR Interval:    QRS Duration:    QT Interval:    QTC Calculation:   R Axis:      Text Interpretation:         Labs Reviewed  COMPREHENSIVE METABOLIC PANEL WITH GFR - Abnormal; Notable for the following components:      Result Value   Glucose, Bld 134 (*)    All other components within normal limits  CBC - Abnormal;  Notable for the following components:   WBC 12.7 (*)    RBC 5.53 (*)    Hemoglobin 15.8 (*)    HCT 47.8 (*)    All other components within normal limits  URINALYSIS, ROUTINE W REFLEX MICROSCOPIC - Abnormal; Notable for the following components:   Hgb urine dipstick TRACE (*)    All other components within normal limits  URINALYSIS, MICROSCOPIC (REFLEX) - Abnormal; Notable for the following components:   Bacteria, UA RARE (*)    All other components within normal limits  LIPASE, BLOOD    CT ABDOMEN PELVIS W CONTRAST  Final Result      Medications  prochlorperazine (COMPAZINE) injection 10 mg (10 mg Intravenous Given 10/30/23 0027)  diphenhydrAMINE (BENADRYL) injection 25 mg (25 mg Intravenous Given 10/30/23 0027)  famotidine (PEPCID) IVPB 20 mg premix (20 mg Intravenous New Bag/Given 10/30/23 0025)  ketorolac (TORADOL) 15 MG/ML injection 15 mg (15 mg Intravenous Given 10/30/23 0026)  sodium chloride 0.9 % bolus 1,000 mL (1,000 mLs Intravenous New Bag/Given 10/30/23 0026)  iohexol (OMNIPAQUE) 300 MG/ML solution 100 mL (100 mLs Intravenous Contrast Given 10/30/23 0128)     Procedures  /  Critical Care Procedures  ED Course and Medical Decision Making  Initial Impression and Ddx Suspect viral gastroenteritis given the sick contacts at home however patient has fairly significant tenderness palpation in the epigastrium on exam raising concern for pancreatitis, perforated viscus.  Awaiting CT.  Past medical/surgical history that increases complexity of ED encounter: Hypertension  Interpretation of Diagnostics I personally reviewed the Laboratory Testing and my interpretation is as follows: No significant blood count or electrolyte disturbance.  Mild leukocytosis  CT unremarkable.  Patient Reassessment and Ultimate Disposition/Management     Patient feeling much better, vitals normal no emergent process appropriate for discharge.  Patient management required discussion with the  following services or consulting groups:  None  Complexity of Problems Addressed Acute illness or injury that poses threat of life of bodily function  Additional Data Reviewed and Analyzed Further history obtained from: Prior labs/imaging results  Additional Factors Impacting ED Encounter Risk Consideration of hospitalization  Elmer Sow. Pilar Plate, MD [facility] Emergency Medicine [facility] [email or web address]  Final Clinical Impressions(s) / ED Diagnoses     ICD-10-CM   1. Epigastric pain  R10.13       ED Discharge Orders          Ordered    ondansetron (ZOFRAN-ODT)  4 MG disintegrating tablet  Every 8 hours PRN        10/30/23 0227    loperamide (IMODIUM) 2 MG capsule  4 times daily PRN        10/30/23 0227    famotidine (PEPCID) 20 MG tablet  2 times daily        10/30/23 0227             Discharge Instructions Discussed with and Provided to Patient:     Discharge Instructions      You were evaluated in the Emergency Department and after careful evaluation, we did not find any emergent condition requiring admission or further testing in the hospital.  Your exam/testing today is overall reassuring.  Symptoms likely due to a viral gastroenteritis.  Use the Zofran as needed for nausea, use the Imodium as needed for diarrhea, use the Pepcid as needed for abdominal discomfort.  Plenty fluids and rest.  Please return to the Emergency Department if you experience any worsening of your condition.   Thank you for allowing [place] to be a part of your care.       Sabas Sous, MD 10/30/23 [phone]

## 2024-03-25 ENCOUNTER — Ambulatory Visit (HOSPITAL_COMMUNITY)
Admission: EM | Admit: 2024-03-25 | Discharge: 2024-03-25 | Disposition: A | Discharge status: Home / Self Care | Attending: Physician Assistant | Admitting: Physician Assistant

## 2024-03-25 ENCOUNTER — Encounter (HOSPITAL_COMMUNITY): Payer: Self-pay | Admitting: *Deleted

## 2024-03-25 ENCOUNTER — Other Ambulatory Visit: Payer: Self-pay

## 2024-03-25 ENCOUNTER — Ambulatory Visit (INDEPENDENT_AMBULATORY_CARE_PROVIDER_SITE_OTHER)

## 2024-03-25 DIAGNOSIS — J069 Acute upper respiratory infection, unspecified: Secondary | ICD-10-CM

## 2024-03-25 DIAGNOSIS — R051 Acute cough: Secondary | ICD-10-CM | POA: Diagnosis not present

## 2024-03-25 LAB — POC COVID19/FLU A&B COMBO
Covid Antigen, POC: NEGATIVE
Influenza A Antigen, POC: NEGATIVE
Influenza B Antigen, POC: NEGATIVE

## 2024-03-25 MED ORDER — BENZONATATE 100 MG PO CAPS: 100.0000 mg | ORAL_CAPSULE | Freq: Three times a day (TID) | ORAL | 0 refills | Status: AC

## 2024-03-25 MED ORDER — ALBUTEROL SULFATE HFA 108 (90 BASE) MCG/ACT IN AERS: 1.0000 | INHALATION_SPRAY | Freq: Four times a day (QID) | RESPIRATORY_TRACT | 0 refills | Status: AC | PRN

## 2024-03-25 NOTE — ED Provider Notes (Signed)
 [facility]    CSN: [id number] Arrival date & time: 03/25/24  1005      History   Chief Complaint Chief Complaint  Patient presents with   Cough   Sore Throat    HPI Jillian Chandler is a 73 y.o. female.   Patient complains of cough and sore throat that started yesterday.  She also complains of associated body aches.  Denies fever but does report chills.  She reports has been sick at home with similar symptoms.  She complains of chest tightness with cough.  Denies shortness of breath or wheezing.    Past Medical History:  Diagnosis Date   Arthritis    Breast cancer (HCC) 01/2009   left brest- status post mastectomy, intolerant to Arimidex, declined tamoxfien   Gastric ulcer    CLO (-) per EGD ~ 11-2011   Hyperlipidemia    Hypertension 2006    Patient Active Problem List   Diagnosis Date Noted   Greater trochanteric pain syndrome of right lower extremity 05/07/2021   Left lateral epicondylitis 02/28/2017   Left shoulder pain 02/28/2017   Bilateral knee pain 02/25/2017   Breast cancer, left breast (HCC) 10/02/2013   Hyperlipidemia 07/26/2013   Chest pain with moderate risk for cardiac etiology 07/06/2013   Headache 10/03/2012   Menopause 02/17/2012   Diabetes mellitus (HCC) 01/25/2012   PUD (peptic ulcer disease) 01/25/2012   Diverticulitis 07/20/2011   General medical examination 06/18/2011   Back pain 11/25/2010   Osteoarthritis 01/21/2010   Essential hypertension 08/12/2009   BREAST CANCER, HX OF 08/12/2009    Past Surgical History:  Procedure Laterality Date   BREAST SURGERY     COLONOSCOPY W/ POLYPECTOMY  11/2011   MASTECTOMY Left 04/2009   MASTECTOMY Left    ROBOTIC ASSISTED BILATERAL SALPINGO OOPHERECTOMY Bilateral 12/09/2017   Procedure: XI ROBOTIC ASSISTED BILATERAL SALPINGO OOPHORECTOMY;  Surgeon: Anitra Freddy NOVAK, MD;  Location: [facility];  Service: Gynecology;  Laterality: Bilateral;    OB History     Gravida  4   Para  3   Term  3    Preterm      AB  1   Living  3      SAB  1   IAB      Ectopic      Multiple      Live Births  3            Home Medications    Prior to Admission medications   Medication Sig Start Date End Date Taking? Authorizing Provider  albuterol  (VENTOLIN  HFA) 108 (90 Base) MCG/ACT inhaler Inhale 1-2 puffs into the lungs every 6 (six) hours as needed for wheezing or shortness of breath. 03/25/24  Yes Ward, Harlene PEDLAR, PA-C  atorvastatin  (LIPITOR) 40 MG tablet TAKE 1 TABLET BY MOUTH ONCE DAILY 09/05/18  Yes Saguier, [place], PA-C  b complex vitamins capsule Take 1 capsule by mouth daily.   Yes [provider]  benzonatate  (TESSALON ) 100 MG capsule Take 1 capsule (100 mg total) by mouth every 8 (eight) hours. 03/25/24  Yes Ward, Andris Brothers Z, PA-C  Cholecalciferol (VITAMIN D3 PO) Take 3 tablets by mouth daily before breakfast.    Yes [provider]  diazepam  (VALIUM ) 5 MG tablet 1 tab po q hs prn insomnia or anxiety 03/17/19  Yes Saguier, [place], PA-C  famotidine  (PEPCID ) 20 MG tablet Take 1 tablet (20 mg total) by mouth 2 (two) times daily. 10/30/23  Yes Theadore Ozell HERO, MD  lisinopril -hydrochlorothiazide  (ZESTORETIC ) 20-25 MG tablet Take 1 tablet by mouth once daily 06/22/19  Yes Saguier, [place], PA-C  metoprolol  succinate (TOPROL -XL) 50 MG 24 hr tablet TAKE 1 TABLET BY MOUTH ONCE DAILY **TAKE  WITH  OR  IMMEDIATELY  FOLLOWING  A  MEAL 09/23/19  Yes Saguier, [place], PA-C  olmesartan-hydrochlorothiazide  (BENICAR HCT) 40-25 MG tablet Take 1 tablet by mouth daily. 04/17/20  Yes [provider]  Omega-3 Fatty Acids (OMEGA 3 PO) Take 2 capsules by mouth daily.   Yes [provider]  omeprazole  (PRILOSEC) 20 MG capsule Take 1 capsule by mouth daily. 07/29/23  Yes [provider]  pyridOXINE (VITAMIN B-6) 100 MG tablet Take 100 mg by mouth daily before breakfast.   Yes [provider]  QUEtiapine  (SEROQUEL ) 50 MG tablet Take 1 tablet (50 mg total)  by mouth at bedtime. 04/05/19  Yes Saguier, [place], PA-C  vitamin E 400 UNIT capsule Take 1,200 Units by mouth daily before breakfast.    Yes [provider]  acetaminophen  (TYLENOL ) 500 MG tablet Take 1 tablet (500 mg total) by mouth every 6 (six) hours as needed. 02/04/23   Rising, Asberry, PA-C  ibuprofen  (ADVIL ) 400 MG tablet Take 1 tablet (400 mg total) by mouth every 6 (six) hours as needed. 02/04/23   Rising, Asberry, PA-C  loperamide  (IMODIUM ) 2 MG capsule Take 1 capsule (2 mg total) by mouth 4 (four) times daily as needed for diarrhea or loose stools. 10/30/23   Theadore Ozell HERO, MD  ondansetron  (ZOFRAN -ODT) 4 MG disintegrating tablet Take 1 tablet (4 mg total) by mouth every 8 (eight) hours as needed for nausea or vomiting. 10/30/23   Theadore Ozell HERO, MD  phenol (CHLORASEPTIC) 1.4 % LIQD Use as directed 1 spray in the mouth or throat as needed for throat irritation / pain. 07/30/18   Caccavale, Sophia, PA-C    Family History Family History  Problem Relation Age of Onset   Heart attack Father 60       F age 29, M age 76s   Heart disease Father    Diabetes Sister    Diabetes Brother    Diabetes Sister    Heart disease Mother    Diabetes Maternal Aunt    Diabetes Maternal Uncle    Diabetes Paternal Aunt    Diabetes Paternal Uncle    Diabetes Maternal Grandmother    Diabetes Maternal Grandfather    Diabetes Paternal Grandmother    Diabetes Paternal Grandfather    Stroke Neg Hx    Colon cancer Neg Hx    Breast cancer Neg Hx     Social History Social History   Tobacco Use   Smoking status: Former    Current packs/day: 0.00    Types: Cigarettes    Quit date: 02/17/2007    Years since quitting: 17.1   Smokeless tobacco: Never  Vaping Use   Vaping status: Never Used  Substance Use Topics   Alcohol use: No   Drug use: No     Allergies   Prednisone    Review of Systems Review of Systems  Constitutional:  Negative for chills and fever.  HENT:  Positive for sore  throat. Negative for ear pain.   Eyes:  Negative for pain and visual disturbance.  Respiratory:  Positive for cough. Negative for shortness of breath.   Cardiovascular:  Negative for chest pain and palpitations.  Gastrointestinal:  Negative for abdominal pain and vomiting.  Genitourinary:  Negative for dysuria and hematuria.  Musculoskeletal:  Positive for myalgias. Negative for arthralgias and back pain.  Skin:  Negative for color change and rash.  Neurological:  Negative for seizures and syncope.  All other systems reviewed and are negative.    Physical Exam Triage Vital Signs ED Triage Vitals  Encounter Vitals Group     BP 03/25/24 1028 (!) 144/80     Girls Systolic BP Percentile --      Girls Diastolic BP Percentile --      Boys Systolic BP Percentile --      Boys Diastolic BP Percentile --      Pulse Rate 03/25/24 1028 94     Resp 03/25/24 1028 20     Temp 03/25/24 1028 98.2 F (36.8 C)     Temp src --      SpO2 03/25/24 1028 97 %     Weight --      Height --      Head Circumference --      Peak Flow --      Pain Score 03/25/24 1024 6     Pain Loc --      Pain Education --      Exclude from Growth Chart --    No data found.  Updated Vital Signs BP (!) 144/80   Pulse 94   Temp 98.2 F (36.8 C)   Resp 20   SpO2 97%   Visual Acuity Right Eye Distance:   Left Eye Distance:   Bilateral Distance:    Right Eye Near:   Left Eye Near:    Bilateral Near:     Physical Exam Vitals and nursing note reviewed.  Constitutional:      General: She is not in acute distress.    Appearance: She is well-developed.  HENT:     Head: Normocephalic and atraumatic.  Eyes:     Conjunctiva/sclera: Conjunctivae normal.  Cardiovascular:     Rate and Rhythm: Normal rate and regular rhythm.     Heart sounds: No murmur heard. Pulmonary:     Effort: Pulmonary effort is normal. No respiratory distress.     Breath sounds: Normal breath sounds.  Abdominal:     Palpations:  Abdomen is soft.     Tenderness: There is no abdominal tenderness.  Musculoskeletal:        General: No swelling.     Cervical back: Neck supple.  Skin:    General: Skin is warm and dry.     Capillary Refill: Capillary refill takes less than 2 seconds.  Neurological:     Mental Status: She is alert.  Psychiatric:        Mood and Affect: Mood normal.      UC Treatments / Results  Labs (all labs ordered are listed, but only abnormal results are displayed) Labs Reviewed  POC COVID19/FLU A&B COMBO    EKG   Radiology DG Chest 2 View Result Date: 03/25/2024 CLINICAL DATA:  Cough. EXAM: CHEST - 2 VIEW COMPARISON:  None Available. FINDINGS: The heart size is normal. Tortuosity and atherosclerotic calcification thoracic aorta noted. Both lungs are clear. The visualized skeletal structures are unremarkable. IMPRESSION: No active cardiopulmonary disease. Electronically Signed   By: Norleen DELENA Kil M.D.   On: 03/25/2024 11:58    Procedures Procedures (including critical care time)  Medications Ordered in UC Medications - No data to display  Initial Impression / Assessment and Plan / UC Course  I have reviewed the triage vital signs and the nursing notes.  Pertinent labs &  imaging results that were available during my care of the patient were reviewed by me and considered in my medical decision making (see chart for details).     Upper viral respiratory infection with acute cough.  Chest x-ray normal.  Supportive care discussed.  Prescribed Tessalon  to take as needed and albuterol  inhaler to use as needed.  Patient overall well-appearing in no acute distress, vitals within normal limits.  COVID test negative in clinic today. Final Clinical Impressions(s) / UC Diagnoses   Final diagnoses:  Acute cough  Acute upper respiratory infection     Discharge Instructions      Your chest xray has no acute findings You can take tessalon  as needed for cough I have also sent in an inhaler  to use as needed for wheezing or shortness of breath.  Can take Mucinex or use Flonase  for congestion Drink plenty of fluids and rest.  Return if no improvement or symptoms become worse.    ED Prescriptions     Medication Sig Dispense Auth. Provider   benzonatate  (TESSALON ) 100 MG capsule Take 1 capsule (100 mg total) by mouth every 8 (eight) hours. 21 capsule Ward, Alante Tolan Z, PA-C   albuterol  (VENTOLIN  HFA) 108 (90 Base) MCG/ACT inhaler Inhale 1-2 puffs into the lungs every 6 (six) hours as needed for wheezing or shortness of breath. 1 each Ward, Harlene PEDLAR, PA-C      PDMP not reviewed this encounter.   Ward, Harlene PEDLAR, PA-C 03/25/24 1237

## 2024-03-25 NOTE — ED Triage Notes (Signed)
 PT reports he Husband has been sick .Yesterday Pt developed a cough and sore throat. Pt also has general body aches. P thas not taken any OTC for sx's.

## 2024-03-25 NOTE — Discharge Instructions (Signed)
 Your chest xray has no acute findings You can take tessalon  as needed for cough I have also sent in an inhaler to use as needed for wheezing or shortness of breath.  Can take Mucinex or use Flonase  for congestion Drink plenty of fluids and rest.  Return if no improvement or symptoms become worse.

## 2024-06-03 ENCOUNTER — Encounter (HOSPITAL_BASED_OUTPATIENT_CLINIC_OR_DEPARTMENT_OTHER): Payer: Self-pay

## 2024-06-03 ENCOUNTER — Emergency Department (HOSPITAL_BASED_OUTPATIENT_CLINIC_OR_DEPARTMENT_OTHER)

## 2024-06-03 ENCOUNTER — Emergency Department (HOSPITAL_BASED_OUTPATIENT_CLINIC_OR_DEPARTMENT_OTHER)
Admission: EM | Admit: 2024-06-03 | Discharge: 2024-06-03 | Disposition: A | Discharge status: Home / Self Care | Attending: Emergency Medicine | Admitting: Emergency Medicine

## 2024-06-03 DIAGNOSIS — M1990 Unspecified osteoarthritis, unspecified site: Secondary | ICD-10-CM

## 2024-06-03 DIAGNOSIS — M25562 Pain in left knee: Secondary | ICD-10-CM | POA: Diagnosis present

## 2024-06-03 DIAGNOSIS — M1712 Unilateral primary osteoarthritis, left knee: Secondary | ICD-10-CM | POA: Insufficient documentation

## 2024-06-03 MED ORDER — TIZANIDINE HCL 2 MG PO TABS
4.0000 mg | ORAL_TABLET | Freq: Once | ORAL | Status: AC
  Administered 2024-06-03: 4 mg via ORAL
  Filled 2024-06-03: qty 2

## 2024-06-03 MED ORDER — ACETAMINOPHEN 500 MG PO TABS
1000.0000 mg | ORAL_TABLET | Freq: Once | ORAL | Status: AC
  Administered 2024-06-03: 1000 mg via ORAL
  Filled 2024-06-03: qty 2

## 2024-06-03 MED ORDER — NAPROXEN 500 MG PO TABS
500.0000 mg | ORAL_TABLET | Freq: Two times a day (BID) | ORAL | 0 refills | Status: AC
Start: 2024-06-03 — End: ?

## 2024-06-03 MED ORDER — LIDOCAINE 5 % EX PTCH
1.0000 | MEDICATED_PATCH | Freq: Once | CUTANEOUS | Status: DC
  Administered 2024-06-03: 1 via TRANSDERMAL
  Filled 2024-06-03: qty 1

## 2024-06-03 MED ORDER — OXYCODONE HCL 5 MG PO TABS
2.5000 mg | ORAL_TABLET | Freq: Once | ORAL | Status: AC
  Administered 2024-06-03: 2.5 mg via ORAL
  Filled 2024-06-03: qty 1

## 2024-06-03 MED ORDER — TIZANIDINE HCL 4 MG PO TABS: 2.0000 mg | ORAL_TABLET | Freq: Two times a day (BID) | ORAL | 0 refills | Status: AC | PRN

## 2024-06-03 MED ORDER — NAPROXEN 250 MG PO TABS
500.0000 mg | ORAL_TABLET | Freq: Once | ORAL | Status: AC
  Administered 2024-06-03: 500 mg via ORAL
  Filled 2024-06-03: qty 2

## 2024-06-03 NOTE — ED Triage Notes (Addendum)
 Last night around 2230, pt starting having severe L knee pain. Pt reporting hx arthritis, feels like she may be having flare up. Pt having limited mobility d/t pain. Pt took Tylenol  last night and Meloxicam  this morning PTA, reporting no relief.

## 2024-06-03 NOTE — ED Provider Notes (Signed)
 [place] [facility] AT [facility] Provider Note   CSN: [id number] Arrival date & time: 06/03/24  9156     History Chief Complaint  Patient presents with   Knee Pain    HPI: Jillian Chandler is a 73 y.o. female with history pertinent arthritis who presents complaining of left knee pain. Patient arrived via POV accompanied by husband.  History provided by patient.  Spanish teleinterpreter Castor, [id number] facilitated this encounter.  Patient reports that she has a history of arthritis that gets worse when the weather is cold.  Denies any recent trauma or falls, fever or chills, chest pain, shortness of breath, nausea, vomiting, diarrhea.  Reports that her left knee began hurting on the medial aspect last night, reports that she took 2 Tylenol , however it was still hurting this morning so she took meloxicam , however she is still having significant pain, particular with ambulation, therefore she decided come to the emergency department for further evaluation.  Reports that she previously has had similar symptoms in her right knee that improved with a injection, therefore requests an injection, reports that this was approximately 7 years ago.  Denies any history of joint infections or gout.  Patient's recorded medical, surgical, social, medication list and allergies were reviewed in the Snapshot window as part of the initial history.   Prior to Admission medications   Medication Sig Start Date End Date Taking? Authorizing Provider  naproxen (NAPROSYN) 500 MG tablet Take 1 tablet (500 mg total) by mouth 2 (two) times daily. 06/03/24  Yes Rogelia Jerilynn RAMAN, MD  tiZANidine (ZANAFLEX) 4 MG tablet Take 0.5 tablets (2 mg total) by mouth 2 (two) times daily as needed for up to 5 days for muscle spasms. 06/03/24 06/08/24 Yes Rogelia Jerilynn RAMAN, MD  acetaminophen  (TYLENOL ) 500 MG tablet Take 1 tablet (500 mg total) by mouth every 6 (six) hours as needed. 02/04/23   Rising, Asberry, PA-C   albuterol  (VENTOLIN  HFA) 108 (90 Base) MCG/ACT inhaler Inhale 1-2 puffs into the lungs every 6 (six) hours as needed for wheezing or shortness of breath. 03/25/24   Ward, Harlene PEDLAR, PA-C  atorvastatin  (LIPITOR) 40 MG tablet TAKE 1 TABLET BY MOUTH ONCE DAILY 09/05/18   Saguier, Dallas, PA-C  b complex vitamins capsule Take 1 capsule by mouth daily.    [provider]  benzonatate  (TESSALON ) 100 MG capsule Take 1 capsule (100 mg total) by mouth every 8 (eight) hours. 03/25/24   Ward, Jessica Z, PA-C  Cholecalciferol (VITAMIN D3 PO) Take 3 tablets by mouth daily before breakfast.     [provider]  diazepam  (VALIUM ) 5 MG tablet 1 tab po q hs prn insomnia or anxiety 03/17/19   Saguier, Dallas, PA-C  famotidine  (PEPCID ) 20 MG tablet Take 1 tablet (20 mg total) by mouth 2 (two) times daily. 10/30/23   Theadore Ozell HERO, MD  ibuprofen  (ADVIL ) 400 MG tablet Take 1 tablet (400 mg total) by mouth every 6 (six) hours as needed. 02/04/23   Rising, Asberry, PA-C  lisinopril -hydrochlorothiazide  (ZESTORETIC ) 20-25 MG tablet Take 1 tablet by mouth once daily 06/22/19   Saguier, Dallas, PA-C  loperamide  (IMODIUM ) 2 MG capsule Take 1 capsule (2 mg total) by mouth 4 (four) times daily as needed for diarrhea or loose stools. 10/30/23   Theadore Ozell HERO, MD  metoprolol  succinate (TOPROL -XL) 50 MG 24 hr tablet TAKE 1 TABLET BY MOUTH ONCE DAILY **TAKE  WITH  OR  IMMEDIATELY  FOLLOWING  A  MEAL 09/23/19  Saguier, Dallas, PA-C  olmesartan-hydrochlorothiazide  (BENICAR HCT) 40-25 MG tablet Take 1 tablet by mouth daily. 04/17/20   [provider]  Omega-3 Fatty Acids (OMEGA 3 PO) Take 2 capsules by mouth daily.    [provider]  omeprazole  (PRILOSEC) 20 MG capsule Take 1 capsule by mouth daily. 07/29/23   [provider]  ondansetron  (ZOFRAN -ODT) 4 MG disintegrating tablet Take 1 tablet (4 mg total) by mouth every 8 (eight) hours as needed for nausea or vomiting. 10/30/23   Theadore Ozell HERO, MD   phenol (CHLORASEPTIC) 1.4 % LIQD Use as directed 1 spray in the mouth or throat as needed for throat irritation / pain. 07/30/18   Caccavale, Sophia, PA-C  pyridOXINE (VITAMIN B-6) 100 MG tablet Take 100 mg by mouth daily before breakfast.    [provider]  QUEtiapine  (SEROQUEL ) 50 MG tablet Take 1 tablet (50 mg total) by mouth at bedtime. 04/05/19   Saguier, Dallas, PA-C  vitamin E 400 UNIT capsule Take 1,200 Units by mouth daily before breakfast.     [provider]     Allergies: Prednisone    Review of Systems   ROS as per HPI  Physical Exam Updated Vital Signs BP (!) 165/77 (BP Location: Right Arm)   Pulse 85   Temp 97.7 F (36.5 C) (Oral)   Resp 16   Ht 5' 7 (1.702 m)   Wt 79.4 kg   SpO2 98%   BMI 27.41 kg/m  Physical Exam Vitals and nursing note reviewed.  Constitutional:      General: She is not in acute distress.    Appearance: Normal appearance.  HENT:     Head: Normocephalic and atraumatic.  Eyes:     Extraocular Movements: Extraocular movements intact.  Cardiovascular:     Rate and Rhythm: Normal rate.     Pulses: Normal pulses.  Pulmonary:     Effort: Pulmonary effort is normal.  Musculoskeletal:        General: No swelling.     Right knee: Normal.     Left knee: Crepitus (Crepitus with active and passive range of motion) present. No swelling, deformity, effusion or erythema. Normal range of motion. Tenderness present over the medial joint line. No LCL laxity, MCL laxity, ACL laxity or PCL laxity. Skin:    General: Skin is warm and dry.     Capillary Refill: Capillary refill takes less than 2 seconds.  Neurological:     General: No focal deficit present.     Mental Status: She is alert and oriented to person, place, and time.     Gait: Gait abnormal (Ambulates with slight left-sided limp, though ambulating independently without difficulty).     ED Course/ Medical Decision Making/ A&P    Procedures Procedures   Medications  Ordered in ED Medications  lidocaine  (LIDODERM ) 5 % 1 patch (1 patch Transdermal Patch Applied 06/03/24 0935)  naproxen (NAPROSYN) tablet 500 mg (500 mg Oral Given 06/03/24 0934)  acetaminophen  (TYLENOL ) tablet 1,000 mg (1,000 mg Oral Given 06/03/24 0934)  tiZANidine (ZANAFLEX) tablet 4 mg (4 mg Oral Given 06/03/24 0933)  oxyCODONE  (Oxy IR/ROXICODONE ) immediate release tablet 2.5 mg (2.5 mg Oral Given 06/03/24 0932)    Medical Decision Making:   Cleota Pellerito is a 73 y.o. female who presents for right knee pain as per above.  Physical exam is pertinent for crepitus with range of motion, and medial joint line tenderness  The differential includes but is not limited to arthritis, fracture, dislocation, septic  arthritis, gout.  Independent historian: None  External data reviewed: Notes: Chart review reveals that patient has a pre-existing care relationship with Dr. Donnice Derrick with [facility], therefore will refer back to orthopedic surgery with Dr. Ernie Leos: Not indicated  Radiology: Ordered, Independent interpretation, Details: Personally reviewed plain film of the knee, do not appreciate fracture, dislocation, traumatic malalignment, effusion.  I do appreciate degenerative changes particularly subpatellar as well as medial compartment narrowing, and All images reviewed independently.  Agree with radiology report at this time.   DG Knee 3 Views Left Result Date: 06/03/2024 EXAM: 3 VIEW(S) XRAY OF THE LEFT KNEE 06/03/2024 09:48:38 AM COMPARISON: Comparison study dated 10/14/2017. CLINICAL HISTORY: 855384 Pain [id number] Pain FINDINGS: BONES AND JOINTS: No acute fracture. No joint dislocation. No significant joint effusion. Moderate narrowing of the medial joint space is noted. A rounded lucency is now noted in the superior portion of the patella, which may represent subchondral cyst formation. Mild patellar spurring is noted. SOFT TISSUES: The soft tissues are unremarkable. IMPRESSION: 1. Moderate  medial compartment joint space narrowing. 2. Rounded lucency in the superior patella, favored to represent a subchondral cyst. 3. Mild patellar spurring. Electronically signed by: Lynwood Seip MD 06/03/2024 10:11 AM EDT RP Workstation: [id number]    EKG/Medicine tests: Not indicated EKG Interpretation:    Interventions: Tizanidine, oxycodone , naproxen, lidocaine  patch, Tylenol   See the EMR for full details regarding lab and imaging results.  Patient reports that she has a history of arthritis that worsens with cold temperature, temperature was in the 30s last night.  On exam patient does not have any erythema, swelling, effusion, no systemic symptoms, no history of gout, physical exam not concerning for gout, septic arthritis.  X-ray obtained and demonstrates degenerative changes without acute fracture or dislocation.  Patient treated with multimodal pain therapy with significant improvement in symptoms, able to ambulate without difficulty.  Will refer to orthopedic surgery for further evaluation and consideration for therapeutic injections.  Presentation is most consistent with acute uncomplicated illness and Presentation is most consistent with exacerbation of chronic illness  Discussion of management or test interpretations with external provider(s): Not indicated  Risk Drugs:OTC drugs and Prescription drug management  Disposition: DISCHARGE: I believe that the patient is safe for discharge home with outpatient follow-up. Patient was informed of all pertinent physical exam, laboratory, and imaging findings. Patient's suspected etiology of their symptom presentation was discussed with the patient and all questions were answered. We discussed following up with PCP, orthopedic surgery. I provided thorough ED return precautions. The patient feels safe and comfortable with this plan.  MDM generated using voice dictation software and may contain dictation errors.  Please contact me for any  clarification or with any questions.  Clinical Impression:  1. Acute pain of left knee   2. Arthritis      Discharge   Final Clinical Impression(s) / ED Diagnoses Final diagnoses:  Acute pain of left knee  Arthritis    Rx / DC Orders ED Discharge Orders          Ordered    Ambulatory referral to Orthopedic Surgery        06/03/24 0950    naproxen (NAPROSYN) 500 MG tablet  2 times daily        06/03/24 1030    tiZANidine (ZANAFLEX) 4 MG tablet  2 times daily PRN        06/03/24 1030             Aimi Essner, Jerilynn RAMAN,  MD 06/03/24 1030

## 2024-06-03 NOTE — Discharge Instructions (Signed)
 Jillian Chandler  Thank you for allowing us  to take care of you today.  You came to the Emergency Department today because you had bad pain on your left knee.  Here in the emergency department your symptoms are not consistent with gout or an infection of the knee.  We got an x-ray which does show significant arthritis.  After getting multiple different pain medications you are feeling much better.  We recommend taking naproxen twice a day for the next 7 days (do not take any Advil , Motrin , meloxicam  while you are taking the naproxen).  Additionally you can take 1000 mg Tylenol  every 6 hours as needed for pain.  You can also purchase lidocaine  patches over-the-counter at most grocery stores and pharmacies and put these on your knee.  Will also prescribe tizanidine, which is a muscle relaxer that you can use up to twice a day as needed for pain.  We will give you a referral back to an orthopedic surgeon who can see for further follow you up and discuss with him whether or not you need a joint injection.  To-Do: 1. Please follow-up with your primary doctor within 1 month / as soon as possible.   Please return to the Emergency Department or call 911 if you experience have worsening of your symptoms, or do not get better, chest pain, shortness of breath, severe or significantly worsening pain, high fever, severe confusion, pass out or have any reason to think that you need emergency medical care.   We hope you feel better soon.   Mitzie Later, MD Department of Emergency Medicine [facility]  Gracias por permitirnos atenderle hoy.  Usted acudi hoy al servicio de urgencias debido a un fuerte dolor en la rodilla izquierda. Aqu, en urgencias, sus sntomas no coinciden con gota ni con una infeccin de rodilla. Le realizamos una radiografa que muestra artritis significativa. Tras recibir scientist, research (physical sciences), se siente clinical research associate. Le recomendamos tomar [employer] al da  durante los prximos 7 das (no tome Advil , Motrin  ni meloxicam  mientras est tomando naproxeno). Adems, puede tomar 1000 mg de Tylenol  cada 6 horas segn sea necesario para el dolor. Tambin puede comprar parches de lidocana sin receta en la mayora de supermercados y farmacias y colocrselos en la rodilla. Le recetaremos tizanidina, un relajante muscular que puede usar oneok veces al da segn sea necesario para chief technology officer. Le daremos una referencia para que consulte con un cirujano ortopdico para que le haga un seguimiento y pueda hablar con l sobre la necesidad de una infiltracin articular.  Para hacer: 1. Por favor, haga una consulta de seguimiento con su mdico de cabecera dentro de un mes o lo antes posible.  Por favor, regrese al Departamento de Emergencias o llame al 911 si experimenta un empeoramiento de sus sntomas, si no mejora, si presenta dolor en el pecho, dificultad para respirar, dolor intenso o que empeora significativamente, fiebre alta, confusin grave, desmayo o cualquier motivo para pensar que necesita atencin mdica de associate professor.  Esperamos que se sienta mejor pronto.  Dra. Mitzie Later Departamento de Medicina de Emergencia [facility] [employer]

## 2024-06-03 NOTE — ED Notes (Signed)
 ED Provider at bedside.
